# Patient Record
Sex: Female | Born: 1955 | Race: White | Hispanic: No | Marital: Married | Smoking: Never smoker
Health system: Southern US, Community
[De-identification: ages and names within clinical notes are randomized; demographics above are authoritative.]

## PROBLEM LIST (undated history)

## (undated) DIAGNOSIS — Z9889 Other specified postprocedural states: Secondary | ICD-10-CM

## (undated) DIAGNOSIS — M5126 Other intervertebral disc displacement, lumbar region: Secondary | ICD-10-CM

## (undated) DIAGNOSIS — G709 Myoneural disorder, unspecified: Secondary | ICD-10-CM

## (undated) DIAGNOSIS — M542 Cervicalgia: Secondary | ICD-10-CM

## (undated) DIAGNOSIS — R011 Cardiac murmur, unspecified: Secondary | ICD-10-CM

## (undated) DIAGNOSIS — C4491 Basal cell carcinoma of skin, unspecified: Secondary | ICD-10-CM

## (undated) DIAGNOSIS — G500 Trigeminal neuralgia: Secondary | ICD-10-CM

## (undated) DIAGNOSIS — N939 Abnormal uterine and vaginal bleeding, unspecified: Secondary | ICD-10-CM

## (undated) DIAGNOSIS — IMO0002 Reserved for concepts with insufficient information to code with codable children: Secondary | ICD-10-CM

## (undated) DIAGNOSIS — Z803 Family history of malignant neoplasm of breast: Secondary | ICD-10-CM

## (undated) DIAGNOSIS — S0300XA Dislocation of jaw, unspecified side, initial encounter: Secondary | ICD-10-CM

## (undated) DIAGNOSIS — M51369 Other intervertebral disc degeneration, lumbar region without mention of lumbar back pain or lower extremity pain: Secondary | ICD-10-CM

## (undated) DIAGNOSIS — D649 Anemia, unspecified: Secondary | ICD-10-CM

## (undated) DIAGNOSIS — D219 Benign neoplasm of connective and other soft tissue, unspecified: Secondary | ICD-10-CM

## (undated) DIAGNOSIS — Z1379 Encounter for other screening for genetic and chromosomal anomalies: Secondary | ICD-10-CM

## (undated) DIAGNOSIS — Z8742 Personal history of other diseases of the female genital tract: Secondary | ICD-10-CM

## (undated) DIAGNOSIS — B330 Epidemic myalgia: Secondary | ICD-10-CM

## (undated) DIAGNOSIS — F329 Major depressive disorder, single episode, unspecified: Secondary | ICD-10-CM

## (undated) DIAGNOSIS — M1990 Unspecified osteoarthritis, unspecified site: Secondary | ICD-10-CM

## (undated) DIAGNOSIS — T7840XA Allergy, unspecified, initial encounter: Secondary | ICD-10-CM

## (undated) DIAGNOSIS — F419 Anxiety disorder, unspecified: Secondary | ICD-10-CM

## (undated) DIAGNOSIS — M5136 Other intervertebral disc degeneration, lumbar region: Secondary | ICD-10-CM

## (undated) DIAGNOSIS — M5481 Occipital neuralgia: Secondary | ICD-10-CM

## (undated) DIAGNOSIS — Z8669 Personal history of other diseases of the nervous system and sense organs: Secondary | ICD-10-CM

## (undated) DIAGNOSIS — N8110 Cystocele, unspecified: Secondary | ICD-10-CM

## (undated) DIAGNOSIS — F32A Depression, unspecified: Secondary | ICD-10-CM

## (undated) DIAGNOSIS — R32 Unspecified urinary incontinence: Secondary | ICD-10-CM

## (undated) HISTORY — DX: Anemia, unspecified: D64.9

## (undated) HISTORY — DX: Benign neoplasm of connective and other soft tissue, unspecified: D21.9

## (undated) HISTORY — DX: Anxiety disorder, unspecified: F41.9

## (undated) HISTORY — DX: Depression, unspecified: F32.A

## (undated) HISTORY — DX: Major depressive disorder, single episode, unspecified: F32.9

## (undated) HISTORY — DX: Dislocation of jaw, unspecified side, initial encounter: S03.00XA

## (undated) HISTORY — DX: Other specified postprocedural states: Z98.890

## (undated) HISTORY — DX: Unspecified osteoarthritis, unspecified site: M19.90

## (undated) HISTORY — DX: Occipital neuralgia: M54.81

## (undated) HISTORY — DX: Other intervertebral disc displacement, lumbar region: M51.26

## (undated) HISTORY — DX: Other intervertebral disc degeneration, lumbar region without mention of lumbar back pain or lower extremity pain: M51.369

## (undated) HISTORY — DX: Reserved for concepts with insufficient information to code with codable children: IMO0002

## (undated) HISTORY — DX: Cystocele, unspecified: N81.10

## (undated) HISTORY — DX: Cardiac murmur, unspecified: R01.1

## (undated) HISTORY — DX: Other intervertebral disc degeneration, lumbar region: M51.36

## (undated) HISTORY — DX: Encounter for other screening for genetic and chromosomal anomalies: Z13.79

## (undated) HISTORY — DX: Epidemic myalgia: B33.0

## (undated) HISTORY — DX: Unspecified urinary incontinence: R32

## (undated) HISTORY — DX: Personal history of other diseases of the nervous system and sense organs: Z86.69

## (undated) HISTORY — DX: Basal cell carcinoma of skin, unspecified: C44.91

## (undated) HISTORY — DX: Personal history of other diseases of the female genital tract: Z87.42

## (undated) HISTORY — DX: Abnormal uterine and vaginal bleeding, unspecified: N93.9

## (undated) HISTORY — DX: Myoneural disorder, unspecified: G70.9

## (undated) HISTORY — DX: Trigeminal neuralgia: G50.0

## (undated) HISTORY — DX: Cervicalgia: M54.2

## (undated) HISTORY — DX: Family history of malignant neoplasm of breast: Z80.3

## (undated) HISTORY — DX: Allergy, unspecified, initial encounter: T78.40XA

---
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
## 1987-03-25 DIAGNOSIS — Z8742 Personal history of other diseases of the female genital tract: Secondary | ICD-10-CM

## 1987-03-25 HISTORY — DX: Personal history of other diseases of the female genital tract: Z87.42

## 1990-03-24 HISTORY — PX: URETHRAL DIVERTICULUM REPAIR: SHX5148

## 1993-03-24 HISTORY — PX: CHOLECYSTECTOMY: SHX55

## 1995-03-25 HISTORY — PX: VAGINAL HYSTERECTOMY: SUR661

## 1999-02-26 ENCOUNTER — Other Ambulatory Visit: Admission: RE | Admit: 1999-02-26 | Discharge: 1999-02-26 | Payer: Self-pay | Admitting: General Practice

## 1999-03-12 ENCOUNTER — Emergency Department (HOSPITAL_COMMUNITY): Admission: EM | Admit: 1999-03-12 | Discharge: 1999-03-13 | Payer: Self-pay | Admitting: Emergency Medicine

## 1999-03-13 ENCOUNTER — Encounter: Payer: Self-pay | Admitting: Emergency Medicine

## 1999-10-17 ENCOUNTER — Encounter: Admission: RE | Admit: 1999-10-17 | Discharge: 1999-10-17 | Payer: Self-pay | Admitting: Internal Medicine

## 1999-10-17 ENCOUNTER — Encounter: Payer: Self-pay | Admitting: Internal Medicine

## 2000-02-26 ENCOUNTER — Other Ambulatory Visit: Admission: RE | Admit: 2000-02-26 | Discharge: 2000-02-26 | Payer: Self-pay | Admitting: Gynecology

## 2000-10-20 ENCOUNTER — Encounter: Payer: Self-pay | Admitting: Rheumatology

## 2000-10-20 ENCOUNTER — Encounter: Admission: RE | Admit: 2000-10-20 | Discharge: 2000-10-20 | Payer: Self-pay | Admitting: Rheumatology

## 2001-01-01 ENCOUNTER — Encounter: Payer: Self-pay | Admitting: Rheumatology

## 2001-01-01 ENCOUNTER — Encounter: Admission: RE | Admit: 2001-01-01 | Discharge: 2001-01-01 | Payer: Self-pay | Admitting: Rheumatology

## 2001-01-21 ENCOUNTER — Encounter: Admission: RE | Admit: 2001-01-21 | Discharge: 2001-01-21 | Payer: Self-pay | Admitting: Rheumatology

## 2001-01-21 ENCOUNTER — Encounter: Payer: Self-pay | Admitting: Rheumatology

## 2001-03-01 ENCOUNTER — Other Ambulatory Visit: Admission: RE | Admit: 2001-03-01 | Discharge: 2001-03-01 | Payer: Self-pay | Admitting: Gynecology

## 2002-02-07 ENCOUNTER — Other Ambulatory Visit: Admission: RE | Admit: 2002-02-07 | Discharge: 2002-02-07 | Payer: Self-pay | Admitting: Gynecology

## 2003-03-15 ENCOUNTER — Other Ambulatory Visit: Admission: RE | Admit: 2003-03-15 | Discharge: 2003-03-15 | Payer: Self-pay | Admitting: Gynecology

## 2003-05-20 ENCOUNTER — Ambulatory Visit (HOSPITAL_COMMUNITY): Admission: RE | Admit: 2003-05-20 | Discharge: 2003-05-20 | Payer: Self-pay | Admitting: Internal Medicine

## 2004-03-07 ENCOUNTER — Other Ambulatory Visit: Admission: RE | Admit: 2004-03-07 | Discharge: 2004-03-07 | Payer: Self-pay | Admitting: Gynecology

## 2005-04-07 ENCOUNTER — Other Ambulatory Visit: Admission: RE | Admit: 2005-04-07 | Discharge: 2005-04-07 | Payer: Self-pay | Admitting: Gynecology

## 2008-03-24 HISTORY — PX: CRANIECTOMY: SHX331

## 2013-03-24 DIAGNOSIS — C4491 Basal cell carcinoma of skin, unspecified: Secondary | ICD-10-CM

## 2013-03-24 HISTORY — DX: Basal cell carcinoma of skin, unspecified: C44.91

## 2014-03-24 DIAGNOSIS — N8110 Cystocele, unspecified: Secondary | ICD-10-CM

## 2014-03-24 HISTORY — DX: Cystocele, unspecified: N81.10

## 2015-05-01 ENCOUNTER — Ambulatory Visit (INDEPENDENT_AMBULATORY_CARE_PROVIDER_SITE_OTHER): Payer: Medicare Other | Admitting: Family Medicine

## 2015-05-01 ENCOUNTER — Encounter: Payer: Self-pay | Admitting: Family Medicine

## 2015-05-01 VITALS — BP 134/82 | HR 77 | Temp 98.5°F | Resp 20 | Ht 68.5 in | Wt 210.0 lb

## 2015-05-01 DIAGNOSIS — M5481 Occipital neuralgia: Secondary | ICD-10-CM

## 2015-05-01 DIAGNOSIS — G500 Trigeminal neuralgia: Secondary | ICD-10-CM | POA: Insufficient documentation

## 2015-05-01 DIAGNOSIS — N8110 Cystocele, unspecified: Secondary | ICD-10-CM | POA: Insufficient documentation

## 2015-05-01 DIAGNOSIS — G894 Chronic pain syndrome: Secondary | ICD-10-CM

## 2015-05-01 DIAGNOSIS — Z79890 Hormone replacement therapy (postmenopausal): Secondary | ICD-10-CM

## 2015-05-01 DIAGNOSIS — M797 Fibromyalgia: Secondary | ICD-10-CM

## 2015-05-01 DIAGNOSIS — Z7189 Other specified counseling: Secondary | ICD-10-CM

## 2015-05-01 DIAGNOSIS — F1190 Opioid use, unspecified, uncomplicated: Secondary | ICD-10-CM

## 2015-05-01 DIAGNOSIS — M9909 Segmental and somatic dysfunction of abdomen and other regions: Secondary | ICD-10-CM

## 2015-05-01 DIAGNOSIS — Z7689 Persons encountering health services in other specified circumstances: Secondary | ICD-10-CM | POA: Insufficient documentation

## 2015-05-01 MED ORDER — ESTRADIOL 0.05 MG/24HR TD PTWK: 0.0500 mg | MEDICATED_PATCH | TRANSDERMAL | Status: DC
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
## 2015-05-01 NOTE — Patient Instructions (Signed)
Health Maintenance, Female Adopting a healthy lifestyle and getting preventive care can go a long way to promote health and wellness. Talk with your health care provider about what schedule of regular examinations is right for you. This is a good chance for you to check in with your provider about disease prevention and staying healthy. In between checkups, there are plenty of things you can do on your own. Experts have done a lot of research about which lifestyle changes and preventive measures are most likely to keep you healthy. Ask your health care provider for more information. WEIGHT AND DIET  Eat a healthy diet  Be sure to include plenty of vegetables, fruits, low-fat dairy products, and lean protein.  Do not eat a lot of foods high in solid fats, added sugars, or salt.  Get regular exercise. This is one of the most important things you can do for your health.  Most adults should exercise for at least 150 minutes each week. The exercise should increase your heart rate and make you sweat (moderate-intensity exercise).  Most adults should also do strengthening exercises at least twice a week. This is in addition to the moderate-intensity exercise.  Maintain a healthy weight  Body mass index (BMI) is a measurement that can be used to identify possible weight problems. It estimates body fat based on height and weight. Your health care provider can help determine your BMI and help you achieve or maintain a healthy weight.  For females 20 years of age and older:   A BMI below 18.5 is considered underweight.  A BMI of 18.5 to 24.9 is normal.  A BMI of 25 to 29.9 is considered overweight.  A BMI of 30 and above is considered obese.  Watch levels of cholesterol and blood lipids  You should start having your blood tested for lipids and cholesterol at 61 years of age, then have this test every 5 years.  You may need to have your cholesterol levels checked more often if:  Your lipid  or cholesterol levels are high.  You are older than 61 years of age.  You are at high risk for heart disease.  CANCER SCREENING   Lung Cancer  Lung cancer screening is recommended for adults 55-80 years old who are at high risk for lung cancer because of a history of smoking.  A yearly low-dose CT scan of the lungs is recommended for people who:  Currently smoke.  Have quit within the past 15 years.  Have at least a 30-pack-year history of smoking. A pack year is smoking an average of one pack of cigarettes a day for 1 year.  Yearly screening should continue until it has been 15 years since you quit.  Yearly screening should stop if you develop a health problem that would prevent you from having lung cancer treatment.  Breast Cancer  Practice breast self-awareness. This means understanding how your breasts normally appear and feel.  It also means doing regular breast self-exams. Let your health care provider know about any changes, no matter how small.  If you are in your 20s or 30s, you should have a clinical breast exam (CBE) by a health care provider every 1-3 years as part of a regular health exam.  If you are 40 or older, have a CBE every year. Also consider having a breast X-ray (mammogram) every year.  If you have a family history of breast cancer, talk to your health care provider about genetic screening.  If you   are at high risk for breast cancer, talk to your health care provider about having an MRI and a mammogram every year.  Breast cancer gene (BRCA) assessment is recommended for women who have family members with BRCA-related cancers. BRCA-related cancers include:  Breast.  Ovarian.  Tubal.  Peritoneal cancers.  Results of the assessment will determine the need for genetic counseling and BRCA1 and BRCA2 testing. Cervical Cancer Your health care provider may recommend that you be screened regularly for cancer of the pelvic organs (ovaries, uterus, and  vagina). This screening involves a pelvic examination, including checking for microscopic changes to the surface of your cervix (Pap test). You may be encouraged to have this screening done every 3 years, beginning at age 21.  For women ages 30-65, health care providers may recommend pelvic exams and Pap testing every 3 years, or they may recommend the Pap and pelvic exam, combined with testing for human papilloma virus (HPV), every 5 years. Some types of HPV increase your risk of cervical cancer. Testing for HPV may also be done on women of any age with unclear Pap test results.  Other health care providers may not recommend any screening for nonpregnant women who are considered low risk for pelvic cancer and who do not have symptoms. Ask your health care provider if a screening pelvic exam is right for you.  If you have had past treatment for cervical cancer or a condition that could lead to cancer, you need Pap tests and screening for cancer for at least 20 years after your treatment. If Pap tests have been discontinued, your risk factors (such as having a new sexual partner) need to be reassessed to determine if screening should resume. Some women have medical problems that increase the chance of getting cervical cancer. In these cases, your health care provider may recommend more frequent screening and Pap tests. Colorectal Cancer  This type of cancer can be detected and often prevented.  Routine colorectal cancer screening usually begins at 61 years of age and continues through 61 years of age.  Your health care provider may recommend screening at an earlier age if you have risk factors for colon cancer.  Your health care provider may also recommend using home test kits to check for hidden blood in the stool.  A small camera at the end of a tube can be used to examine your colon directly (sigmoidoscopy or colonoscopy). This is done to check for the earliest forms of colorectal  cancer.  Routine screening usually begins at age 50.  Direct examination of the colon should be repeated every 5-10 years through 61 years of age. However, you may need to be screened more often if early forms of precancerous polyps or small growths are found. Skin Cancer  Check your skin from head to toe regularly.  Tell your health care provider about any new moles or changes in moles, especially if there is a change in a mole's shape or color.  Also tell your health care provider if you have a mole that is larger than the size of a pencil eraser.  Always use sunscreen. Apply sunscreen liberally and repeatedly throughout the day.  Protect yourself by wearing long sleeves, pants, a wide-brimmed hat, and sunglasses whenever you are outside. HEART DISEASE, DIABETES, AND HIGH BLOOD PRESSURE   High blood pressure causes heart disease and increases the risk of stroke. High blood pressure is more likely to develop in:  People who have blood pressure in the high end   of the normal range (130-139/85-89 mm Hg).  People who are overweight or obese.  People who are African American.  If you are 38-23 years of age, have your blood pressure checked every 3-5 years. If you are 61 years of age or older, have your blood pressure checked every year. You should have your blood pressure measured twice--once when you are at a hospital or clinic, and once when you are not at a hospital or clinic. Record the average of the two measurements. To check your blood pressure when you are not at a hospital or clinic, you can use:  An automated blood pressure machine at a pharmacy.  A home blood pressure monitor.  If you are between 45 years and 39 years old, ask your health care provider if you should take aspirin to prevent strokes.  Have regular diabetes screenings. This involves taking a blood sample to check your fasting blood sugar level.  If you are at a normal weight and have a low risk for diabetes,  have this test once every three years after 61 years of age.  If you are overweight and have a high risk for diabetes, consider being tested at a younger age or more often. PREVENTING INFECTION  Hepatitis B  If you have a higher risk for hepatitis B, you should be screened for this virus. You are considered at high risk for hepatitis B if:  You were born in a country where hepatitis B is common. Ask your health care provider which countries are considered high risk.  Your parents were born in a high-risk country, and you have not been immunized against hepatitis B (hepatitis B vaccine).  You have HIV or AIDS.  You use needles to inject street drugs.  You live with someone who has hepatitis B.  You have had sex with someone who has hepatitis B.  You get hemodialysis treatment.  You take certain medicines for conditions, including cancer, organ transplantation, and autoimmune conditions. Hepatitis C  Blood testing is recommended for:  Everyone born from [birth year] through 1966.  Anyone with known risk factors for hepatitis C. Sexually transmitted infections (STIs)  You should be screened for sexually transmitted infections (STIs) including gonorrhea and chlamydia if:  You are sexually active and are younger than 61 years of age.  You are older than 61 years of age and your health care provider tells you that you are at risk for this type of infection.  Your sexual activity has changed since you were last screened and you are at an increased risk for chlamydia or gonorrhea. Ask your health care provider if you are at risk.  If you do not have HIV, but are at risk, it may be recommended that you take a prescription medicine daily to prevent HIV infection. This is called pre-exposure prophylaxis (PrEP). You are considered at risk if:  You are sexually active and do not regularly use condoms or know the HIV status of your partner(s).  You take drugs by injection.  You are sexually  active with a partner who has HIV. Talk with your health care provider about whether you are at high risk of being infected with HIV. If you choose to begin PrEP, you should first be tested for HIV. You should then be tested every 3 months for as long as you are taking PrEP.  PREGNANCY   If you are premenopausal and you may become pregnant, ask your health care provider about preconception counseling.  If you may  become pregnant, take 400 to 800 micrograms (mcg) of folic acid every day.  If you want to prevent pregnancy, talk to your health care provider about birth control (contraception). OSTEOPOROSIS AND MENOPAUSE   Osteoporosis is a disease in which the bones lose minerals and strength with aging. This can result in serious bone fractures. Your risk for osteoporosis can be identified using a bone density scan.  If you are 106 years of age or older, or if you are at risk for osteoporosis and fractures, ask your health care provider if you should be screened.  Ask your health care provider whether you should take a calcium or vitamin D supplement to lower your risk for osteoporosis.  Menopause may have certain physical symptoms and risks.  Hormone replacement therapy may reduce some of these symptoms and risks. Talk to your health care provider about whether hormone replacement therapy is right for you.  HOME CARE INSTRUCTIONS   Schedule regular health, dental, and eye exams.  Stay current with your immunizations.   Do not use any tobacco products including cigarettes, chewing tobacco, or electronic cigarettes.  If you are pregnant, do not drink alcohol.  If you are breastfeeding, limit how much and how often you drink alcohol.  Limit alcohol intake to no more than 1 drink per day for nonpregnant women. One drink equals 12 ounces of beer, 5 ounces of wine, or 1 ounces of hard liquor.  Do not use street drugs.  Do not share needles.  Ask your health care provider for help if  you need support or information about quitting drugs.  Tell your health care provider if you often feel depressed.  Tell your health care provider if you have ever been abused or do not feel safe at home.   This information is not intended to replace advice given to you by your health care provider. Make sure you discuss any questions you have with your health care provider.   Document Released: 09/23/2010 Document Revised: 03/31/2014 Document Reviewed: 02/09/2013 Elsevier Interactive Patient Education 2016 [place] wellness visit in the beginning of May with labs at that time.  I will make referral we discussed today

## 2015-05-14 ENCOUNTER — Ambulatory Visit: Payer: Self-pay | Admitting: Obstetrics and Gynecology

## 2015-05-16 DIAGNOSIS — M5412 Radiculopathy, cervical region: Secondary | ICD-10-CM | POA: Diagnosis not present

## 2015-05-16 DIAGNOSIS — G500 Trigeminal neuralgia: Secondary | ICD-10-CM | POA: Diagnosis not present

## 2015-05-16 DIAGNOSIS — G894 Chronic pain syndrome: Secondary | ICD-10-CM | POA: Diagnosis not present

## 2015-05-17 ENCOUNTER — Other Ambulatory Visit: Payer: Self-pay | Admitting: Obstetrics and Gynecology

## 2015-05-17 ENCOUNTER — Encounter: Payer: Self-pay | Admitting: Family Medicine

## 2015-05-17 ENCOUNTER — Other Ambulatory Visit: Payer: Self-pay | Admitting: Family Medicine

## 2015-05-17 ENCOUNTER — Ambulatory Visit (INDEPENDENT_AMBULATORY_CARE_PROVIDER_SITE_OTHER): Payer: Medicare Other | Admitting: Obstetrics and Gynecology

## 2015-05-17 ENCOUNTER — Other Ambulatory Visit: Payer: Self-pay | Admitting: *Deleted

## 2015-05-17 VITALS — BP 118/76 | HR 70 | Resp 14 | Ht 68.5 in | Wt 211.4 lb

## 2015-05-17 DIAGNOSIS — M9909 Segmental and somatic dysfunction of abdomen and other regions: Secondary | ICD-10-CM

## 2015-05-17 DIAGNOSIS — N816 Rectocele: Secondary | ICD-10-CM

## 2015-05-17 DIAGNOSIS — N398 Other specified disorders of urinary system: Secondary | ICD-10-CM | POA: Diagnosis not present

## 2015-05-17 MED ORDER — ESTRADIOL 0.05 MG/24HR TD PTWK: 0.0500 mg | MEDICATED_PATCH | TRANSDERMAL | Status: DC

## 2015-05-17 NOTE — Telephone Encounter (Signed)
Please get a copy of the patient's mammogram done in [place].

## 2015-05-17 NOTE — Addendum Note (Signed)
Addended by: Leota Jacobsen on: 05/17/2015 11:47 AM   Modules accepted: Medications
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
## 2015-05-17 NOTE — Telephone Encounter (Signed)
Medication refill request: [place] Patient Visit today 05/16/25 Next AEX: not scheduled  Last MMG (if hormonal medication request): 10/2014 dense breast/ normal Refill authorized: please advise  Patient saw Dr. Quincy Simmonds today as a new patient but forgot to request refills on Estradiol patches.  When asked about the dosage she said, "Dr. Quincy Simmonds or her assistant will know the dosage."

## 2015-05-17 NOTE — Telephone Encounter (Signed)
Patient saw Dr. Quincy Simmonds today as a new patient but forgot to request refills on Estradiol patches.  When asked about the dosage she said, "Dr. Quincy Simmonds or her assistant will know the dosage."  Diana Gross on [address] in [place]

## 2015-05-17 NOTE — Patient Instructions (Signed)
Polyethylene Glycol powder What is this medicine? POLYETHYLENE GLYCOL 3350 (pol ee ETH i leen; GLYE col) powder is a laxative used to treat constipation. It increases the amount of water in the stool. Bowel movements become easier and more frequent. This medicine may be used for other purposes; ask your health care provider or pharmacist if you have questions. What should I tell my health care provider before I take this medicine? They need to know if you have any of these conditions: -a history of blockage of the stomach or intestine -current abdomen distension or pain -difficulty swallowing -diverticulitis, ulcerative colitis, or other chronic bowel disease -phenylketonuria -an unusual or allergic reaction to polyethylene glycol, other medicines, dyes, or preservatives -pregnant or trying to get pregnant -breast-feeding How should I use this medicine? Take this medicine by mouth. The bottle has a measuring cap that is marked with a line. Pour the powder into the cap up to the marked line (the dose is about 1 heaping tablespoon). Add the powder in the cap to a full glass (4 to 8 ounces or 120 to 240 ml) of water, juice, soda, coffee or tea. Mix the powder well. Drink the solution. Take exactly as directed. Do not take your medicine more often than directed. Talk to your pediatrician regarding the use of this medicine in children. Special care may be needed. Overdosage: If you think you have taken too much of this medicine contact a poison control center or emergency room at once. NOTE: This medicine is only for you. Do not share this medicine with others. What if I miss a dose? If you miss a dose, take it as soon as you can. If it is almost time for your next dose, take only that dose. Do not take double or extra doses. What may interact with this medicine? Interactions are not expected. This list may not describe all possible interactions. Give your health care provider a list of all the  medicines, herbs, non-prescription drugs, or dietary supplements you use. Also tell them if you smoke, drink alcohol, or use illegal drugs. Some items may interact with your medicine. What should I watch for while using this medicine? Do not use for more than 2 weeks without advice from your doctor or health care professional. It can take 2 to 4 days to have a bowel movement and to experience improvement in constipation. See your health care professional for any changes in bowel habits, including constipation, that are severe or last longer than three weeks. Always take this medicine with plenty of water. What side effects may I notice from receiving this medicine? Side effects that you should report to your doctor or health care professional as soon as possible: -diarrhea -difficulty breathing -itching of the skin, hives, or skin rash -severe bloating, pain, or distension of the stomach -vomiting Side effects that usually do not require medical attention (report to your doctor or health care professional if they continue or are bothersome): -bloating or gas -lower abdominal discomfort or cramps -nausea This list may not describe all possible side effects. Call your doctor for medical advice about side effects. You may report side effects to FDA at 1-800-FDA-1088. Where should I keep my medicine? Keep out of the reach of children. Store between 15 and 30 degrees C (59 and 86 degrees F). Throw away any unused medicine after the expiration date. NOTE: This sheet is a summary. It may not cover all possible information. If you have questions about this medicine, talk to  your doctor, pharmacist, or health care provider.    2016, Elsevier/Gold Standard. (2007-10-11 16:50:45)  Docusate capsules What is this medicine? DOCUSATE (doc CUE sayt) is stool softener. It helps prevent constipation and straining or discomfort associated with hard or dry stools. This medicine may be used for other purposes;  ask your health care provider or pharmacist if you have questions. What should I tell my health care provider before I take this medicine? They need to know if you have any of these conditions: -nausea or vomiting -severe constipation -stomach pain -sudden change in bowel habit lasting more than 2 weeks -an unusual or allergic reaction to docusate, other medicines, foods, dyes, or preservatives -pregnant or trying to get pregnant -breast-feeding How should I use this medicine? Take this medicine by mouth with a glass of water. Follow the directions on the label. Take your doses at regular intervals. Do not take your medicine more often than directed. Talk to your pediatrician regarding the use of this medicine in children. While this medicine may be prescribed for children as young as 2 years for selected conditions, precautions do apply. Overdosage: If you think you have taken too much of this medicine contact a poison control center or emergency room at once. NOTE: This medicine is only for you. Do not share this medicine with others. What if I miss a dose? If you miss a dose, take it as soon as you can. If it is almost time for your next dose, take only that dose. Do not take double or extra doses. What may interact with this medicine? -mineral oil This list may not describe all possible interactions. Give your health care provider a list of all the medicines, herbs, non-prescription drugs, or dietary supplements you use. Also tell them if you smoke, drink alcohol, or use illegal drugs. Some items may interact with your medicine. What should I watch for while using this medicine? Do not use for more than one week without advice from your doctor or health care professional. If your constipation returns, check with your doctor or health care professional. Drink plenty of water while taking this medicine. Drinking water helps decrease constipation. Stop using this medicine and contact your  doctor or health care professional if you experience any rectal bleeding or do not have a bowel movement after use. These could be signs of a more serious condition. What side effects may I notice from receiving this medicine? Side effects that you should report to your doctor or health care professional as soon as possible: -allergic reactions like skin rash, itching or hives, swelling of the face, lips, or tongue Side effects that usually do not require medical attention (report to your doctor or health care professional if they continue or are bothersome): -diarrhea -stomach cramps -throat irritation This list may not describe all possible side effects. Call your doctor for medical advice about side effects. You may report side effects to FDA at 1-800-FDA-1088. Where should I keep my medicine? Keep out of the reach of children. Store at room temperature between 15 and 30 degrees C (59 and 86 degrees F). Throw away any unused medicine after the expiration date. NOTE: This sheet is a summary. It may not cover all possible information. If you have questions about this medicine, talk to your doctor, pharmacist, or health care provider.    2016, Elsevier/Gold Standard. (2007-07-01 15:56:49)

## 2015-05-21 NOTE — Telephone Encounter (Signed)
I called patient and requested that she get [place] a copy. She said she will call and request it from [place]

## 2015-05-29 ENCOUNTER — Encounter: Payer: Self-pay | Admitting: Obstetrics and Gynecology

## 2015-05-30 ENCOUNTER — Telehealth: Payer: Self-pay | Admitting: Emergency Medicine

## 2015-05-30 NOTE — Telephone Encounter (Signed)
Responded to patient via mychart:    -------------------------------------------------------------------------------- Mrs. Uddin,   The lab results that are available in your medical record are the lab results that were completed for you on 10/13/2014 and ordered by Dr. Zane Herald.  Dr. Quincy Simmonds reviewed them and they have been scanned in to your medical records. Dr. Quincy Simmonds did not provide any notes or mention of concern on the lab results.   When your mammogram arrives it will be scanned into your record as well after Dr. Quincy Simmonds reviews the results.   Sincerely,   Karen Chafe, BSN RN-BC Triage Nurse [place] [address] [place], [place] [address] [facility]:  [phone]; Fax:  [phone]

## 2015-05-30 NOTE — Telephone Encounter (Signed)
Responded to patient via mychart.  Telephone encounter created and sent to Dr. Quincy Simmonds.

## 2015-05-30 NOTE — Telephone Encounter (Signed)
Chief Complaint  Patient presents with  . Advice Only    Patient sent mychart message    ----- Message ----- From: Ulla Gallo Sent: 05/29/2015 1:16 PM EST To: Arloa Koh, MD Subject: Non-Urgent Medical Question  I do not understand my test results, please let me know if there is anything I need to know. I am going to come by today to sign a form so the doctor can get my last mammogram results, since I was told she cannot give me further HRT without seeing this. Thank you, Diana Gross

## 2015-05-30 NOTE — Telephone Encounter (Signed)
Routing to Dr. Silva to review.  

## 2015-05-30 NOTE — Telephone Encounter (Signed)
I made a note to refill her estrogen prescriptions when she returns for a pessary fitting appointment provided I received her mammogram results.  She may have refills until her next mammogram and full annual exam is due.

## 2015-05-31 ENCOUNTER — Telehealth: Payer: Self-pay | Admitting: Obstetrics and Gynecology

## 2015-05-31 NOTE — Telephone Encounter (Signed)
Please contact patient regarding her normal mammogram report which I received.  She may have refills of her hormone replacement until her next annual exam is due, which I believe is August 2017.  She is using Climara 0.05 mg weekly.

## 2015-06-01 MED ORDER — ESTRADIOL 0.05 MG/24HR TD PTWK: 0.0500 mg | MEDICATED_PATCH | TRANSDERMAL | Status: DC

## 2015-06-01 NOTE — Telephone Encounter (Signed)
Spoke with patient. Advised of message as seen below from [place]. She is agreeable. Aware rx for Climara .0.05 mg weekly patches have been sent to her pharmacy on file. Patient is asking about precertification for urodynamics testing. Advised I will send a message to the insurance department in the office to check on the status of this precertification. Advised she will receive a phone call from the insurance department in the office to discuss benefits and then we will move forward with scheduling. She is agreeable.  Cc: Lerry Liner  Routing to provider for final review. Patient agreeable to disposition. Will close encounter.

## 2015-06-01 NOTE — Telephone Encounter (Signed)
Patient's last annual exam was on 05/17/2015. Rx for Climara 0.05 mg weekly patches #[id number] until her next aex is due in 04/2016 sent to pharmacy on file.   Left message to call [place] at [phone].

## 2015-06-01 NOTE — Telephone Encounter (Signed)
Patient returning your call. Best # to reach: [phone]

## 2015-06-07 ENCOUNTER — Encounter: Payer: Self-pay | Admitting: *Deleted

## 2015-06-07 ENCOUNTER — Encounter: Payer: Self-pay | Admitting: Family Medicine

## 2015-06-12 ENCOUNTER — Ambulatory Visit (INDEPENDENT_AMBULATORY_CARE_PROVIDER_SITE_OTHER): Payer: Medicare Other | Admitting: Family Medicine

## 2015-06-12 ENCOUNTER — Encounter: Payer: Self-pay | Admitting: Family Medicine

## 2015-06-12 VITALS — BP 116/80 | HR 67 | Ht 68.0 in | Wt 211.0 lb

## 2015-06-12 DIAGNOSIS — M9903 Segmental and somatic dysfunction of lumbar region: Secondary | ICD-10-CM

## 2015-06-12 DIAGNOSIS — M9902 Segmental and somatic dysfunction of thoracic region: Secondary | ICD-10-CM

## 2015-06-12 DIAGNOSIS — M9901 Segmental and somatic dysfunction of cervical region: Secondary | ICD-10-CM | POA: Diagnosis not present

## 2015-06-12 DIAGNOSIS — M9909 Segmental and somatic dysfunction of abdomen and other regions: Secondary | ICD-10-CM

## 2015-06-12 DIAGNOSIS — M999 Biomechanical lesion, unspecified: Secondary | ICD-10-CM | POA: Insufficient documentation

## 2015-06-12 NOTE — Patient Instructions (Addendum)
Good to meet you  I do things a little different We will try manipulation but is should you have more myofacial release Look up nancy Yao and see if she would be helpful. Also look up Vivi Ferns or even [employer].  On wall with heels, butt shoulder and head touching for a goal of 5 minutes daily  Try to keep monitor at eye level when using a comput and keeping shoulders back in chair and think of a music stand if reading.  Exercises 3 times a week.  Vitamin D 2000 iU daily  Turmeric 500mg  twice daily  Fish oil 2 grams daily  Tart cherry extract any dose.  See me again in 3-4 weeks.

## 2015-06-12 NOTE — Progress Notes (Signed)
Pre visit review using our clinic review tool, if applicable. No additional management support is needed unless otherwise documented below in the visit note. 

## 2015-06-12 NOTE — Assessment & Plan Note (Signed)
Significant somatic dysfunction. Has been seen for manipulation by another provider. Attempted today with good resolution of pain. Patient sounds like she has had more myofascial release and was given names of other individuals who did this type of technique. We discussed over-the-counter medications a could be beneficial. Patient even some home exercises. Tolerated them very well when we were showing her in great detail. We discussed icing better than he. Patient will come back and see me again in 3-4 weeks.

## 2015-06-12 NOTE — Assessment & Plan Note (Signed)
Decision today to treat with OMT was based on Physical Exam  After verbal consent patient was treated with HVLA, ME, FPR techniques in cervical, thoracic and lumbar areas  Patient tolerated the procedure well with improvement in symptoms  Patient given exercises, stretches and lifestyle modifications  See medications in patient instructions if given  Patient will follow up in 3-4 weeks                     
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
## 2015-06-18 ENCOUNTER — Telehealth: Payer: Self-pay | Admitting: Emergency Medicine

## 2015-06-18 NOTE — Telephone Encounter (Signed)
-----   Message from Laurence Slate sent at 06/04/2015  9:39 AM EDT ----- Patient scheduled for 07/11/15 at 10:am for Urodynamics. Please call patient with instructions   Thanks, Seth Bake

## 2015-06-18 NOTE — Telephone Encounter (Signed)
Call to patient to discuss pre-procedure and urodynamics planning.  Unable to leave voice mail on home phone.  Will send mychart message at this time to request patient return call to office.

## 2015-06-20 DIAGNOSIS — M5412 Radiculopathy, cervical region: Secondary | ICD-10-CM | POA: Diagnosis not present

## 2015-06-20 DIAGNOSIS — G894 Chronic pain syndrome: Secondary | ICD-10-CM | POA: Diagnosis not present

## 2015-06-20 DIAGNOSIS — G500 Trigeminal neuralgia: Secondary | ICD-10-CM | POA: Diagnosis not present

## 2015-06-26 ENCOUNTER — Encounter: Payer: Self-pay | Admitting: Emergency Medicine

## 2015-06-26 NOTE — Telephone Encounter (Signed)
Late entry 10:52 Incoming call from patient:   Spoke with patient and urodynamics instructions given. Scheduled urodynamics procedure for 07/11/15 at 10:00 Advised to stop all bladder medications one week prior to procedure, patient states she is not on any medications. Arrive with comfortably full bladder.  Advised will need pre procedure UA to check for any infection prior. Scheduled for 07/05/15.  Scheduled follow up with Dr. Quincy Simmonds for  07/16/15 at 1530 Brief description of procedure given and patient agreeable.  Letter with appointment information and written instructions with post procedure instructions mailed to home address.  Patient verbalizes understanding of instructions and agreeable to appointments as scheduled for testing and follow up.   Routing message for Dr. Quincy Simmonds to review upon return to office.  Patient notes reviewed and fears r/t catheters reviewed with patient. Patient has Xanax that she takes as needed and wants to know if she can take Xanax for procedure. Her husband will be driving her to appointment. Advised will need to obtain verbal consent prior to procedure and will send message to Dr. Quincy Simmonds to ensure okay for Xanax at time of procedure.

## 2015-06-26 NOTE — Telephone Encounter (Signed)
Call to patient, husband Liliane Channel picked up the phone. Patient is not home. Message left to return my call with phone number.

## 2015-06-26 NOTE — Telephone Encounter (Signed)
As long as she comes in and signs her consent prior to the procedure and has a driver, she can take her Xanax. Her appointment with Dr Quincy Simmonds is on another day, so it shouldn't inhibit her understanding of the recommendations.

## 2015-07-02 ENCOUNTER — Ambulatory Visit (INDEPENDENT_AMBULATORY_CARE_PROVIDER_SITE_OTHER): Payer: Medicare Other | Admitting: Family Medicine

## 2015-07-02 ENCOUNTER — Encounter: Payer: Self-pay | Admitting: Family Medicine

## 2015-07-02 VITALS — BP 132/83 | HR 72 | Temp 98.6°F | Resp 20 | Ht 68.0 in | Wt 205.5 lb

## 2015-07-02 DIAGNOSIS — Z83511 Family history of glaucoma: Secondary | ICD-10-CM | POA: Insufficient documentation

## 2015-07-02 DIAGNOSIS — Z131 Encounter for screening for diabetes mellitus: Secondary | ICD-10-CM

## 2015-07-02 DIAGNOSIS — Z0000 Encounter for general adult medical examination without abnormal findings: Secondary | ICD-10-CM | POA: Insufficient documentation

## 2015-07-02 DIAGNOSIS — Z1329 Encounter for screening for other suspected endocrine disorder: Secondary | ICD-10-CM

## 2015-07-02 DIAGNOSIS — L989 Disorder of the skin and subcutaneous tissue, unspecified: Secondary | ICD-10-CM | POA: Diagnosis not present

## 2015-07-02 DIAGNOSIS — Z13220 Encounter for screening for lipoid disorders: Secondary | ICD-10-CM

## 2015-07-02 DIAGNOSIS — Z130 Encounter for screening for diseases of the blood and blood-forming organs and certain disorders involving the immune mechanism: Secondary | ICD-10-CM | POA: Diagnosis not present

## 2015-07-02 DIAGNOSIS — Z23 Encounter for immunization: Secondary | ICD-10-CM | POA: Insufficient documentation

## 2015-07-02 DIAGNOSIS — H579 Unspecified disorder of eye and adnexa: Secondary | ICD-10-CM | POA: Insufficient documentation

## 2015-07-02 DIAGNOSIS — Z6829 Body mass index (BMI) 29.0-29.9, adult: Secondary | ICD-10-CM | POA: Insufficient documentation

## 2015-07-02 HISTORY — DX: Family history of glaucoma: Z83.511

## 2015-07-02 MED ORDER — ZOSTER VACCINE LIVE 19400 UNT/0.65ML ~~LOC~~ SOLR: 0.6500 mL | Freq: Once | SUBCUTANEOUS | Status: DC
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
## 2015-07-02 NOTE — Telephone Encounter (Signed)
I have reviewed the phone notes.  [place] for Xanax if needed as she will have a driver.  You may close the encounter.

## 2015-07-02 NOTE — Telephone Encounter (Signed)
Call to patient. Notified of response from Dr Quincy Simmonds. May take Xanax if needed as long as she has a driver.  Encounter closed.

## 2015-07-02 NOTE — Patient Instructions (Signed)
Health Maintenance, Female Adopting a healthy lifestyle and getting preventive care can go a long way to promote health and wellness. Talk with your health care provider about what schedule of regular examinations is right for you. This is a good chance for you to check in with your provider about disease prevention and staying healthy. In between checkups, there are plenty of things you can do on your own. Experts have done a lot of research about which lifestyle changes and preventive measures are most likely to keep you healthy. Ask your health care provider for more information. WEIGHT AND DIET  Eat a healthy diet  Be sure to include plenty of vegetables, fruits, low-fat dairy products, and lean protein.  Do not eat a lot of foods high in solid fats, added sugars, or salt.  Get regular exercise. This is one of the most important things you can do for your health.  Most adults should exercise for at least 150 minutes each week. The exercise should increase your heart rate and make you sweat (moderate-intensity exercise).  Most adults should also do strengthening exercises at least twice a week. This is in addition to the moderate-intensity exercise.  Maintain a healthy weight  Body mass index (BMI) is a measurement that can be used to identify possible weight problems. It estimates body fat based on height and weight. Your health care provider can help determine your BMI and help you achieve or maintain a healthy weight.  For females 20 years of age and older:   A BMI below 18.5 is considered underweight.  A BMI of 18.5 to 24.9 is normal.  A BMI of 25 to 29.9 is considered overweight.  A BMI of 30 and above is considered obese.  Watch levels of cholesterol and blood lipids  You should start having your blood tested for lipids and cholesterol at 61 years of age, then have this test every 5 years.  You may need to have your cholesterol levels checked more often if:  Your lipid  or cholesterol levels are high.  You are older than 61 years of age.  You are at high risk for heart disease.  CANCER SCREENING   Lung Cancer  Lung cancer screening is recommended for adults 55-80 years old who are at high risk for lung cancer because of a history of smoking.  A yearly low-dose CT scan of the lungs is recommended for people who:  Currently smoke.  Have quit within the past 15 years.  Have at least a 30-pack-year history of smoking. A pack year is smoking an average of one pack of cigarettes a day for 1 year.  Yearly screening should continue until it has been 15 years since you quit.  Yearly screening should stop if you develop a health problem that would prevent you from having lung cancer treatment.  Breast Cancer  Practice breast self-awareness. This means understanding how your breasts normally appear and feel.  It also means doing regular breast self-exams. Let your health care provider know about any changes, no matter how small.  If you are in your 20s or 30s, you should have a clinical breast exam (CBE) by a health care provider every 1-3 years as part of a regular health exam.  If you are 40 or older, have a CBE every year. Also consider having a breast X-ray (mammogram) every year.  If you have a family history of breast cancer, talk to your health care provider about genetic screening.  If you   are at high risk for breast cancer, talk to your health care provider about having an MRI and a mammogram every year.  Breast cancer gene (BRCA) assessment is recommended for women who have family members with BRCA-related cancers. BRCA-related cancers include:  Breast.  Ovarian.  Tubal.  Peritoneal cancers.  Results of the assessment will determine the need for genetic counseling and BRCA1 and BRCA2 testing. Cervical Cancer Your health care provider may recommend that you be screened regularly for cancer of the pelvic organs (ovaries, uterus, and  vagina). This screening involves a pelvic examination, including checking for microscopic changes to the surface of your cervix (Pap test). You may be encouraged to have this screening done every 3 years, beginning at age 21.  For women ages 30-65, health care providers may recommend pelvic exams and Pap testing every 3 years, or they may recommend the Pap and pelvic exam, combined with testing for human papilloma virus (HPV), every 5 years. Some types of HPV increase your risk of cervical cancer. Testing for HPV may also be done on women of any age with unclear Pap test results.  Other health care providers may not recommend any screening for nonpregnant women who are considered low risk for pelvic cancer and who do not have symptoms. Ask your health care provider if a screening pelvic exam is right for you.  If you have had past treatment for cervical cancer or a condition that could lead to cancer, you need Pap tests and screening for cancer for at least 20 years after your treatment. If Pap tests have been discontinued, your risk factors (such as having a new sexual partner) need to be reassessed to determine if screening should resume. Some women have medical problems that increase the chance of getting cervical cancer. In these cases, your health care provider may recommend more frequent screening and Pap tests. Colorectal Cancer  This type of cancer can be detected and often prevented.  Routine colorectal cancer screening usually begins at 61 years of age and continues through 61 years of age.  Your health care provider may recommend screening at an earlier age if you have risk factors for colon cancer.  Your health care provider may also recommend using home test kits to check for hidden blood in the stool.  A small camera at the end of a tube can be used to examine your colon directly (sigmoidoscopy or colonoscopy). This is done to check for the earliest forms of colorectal  cancer.  Routine screening usually begins at age 50.  Direct examination of the colon should be repeated every 5-10 years through 61 years of age. However, you may need to be screened more often if early forms of precancerous polyps or small growths are found. Skin Cancer  Check your skin from head to toe regularly.  Tell your health care provider about any new moles or changes in moles, especially if there is a change in a mole's shape or color.  Also tell your health care provider if you have a mole that is larger than the size of a pencil eraser.  Always use sunscreen. Apply sunscreen liberally and repeatedly throughout the day.  Protect yourself by wearing long sleeves, pants, a wide-brimmed hat, and sunglasses whenever you are outside. HEART DISEASE, DIABETES, AND HIGH BLOOD PRESSURE   High blood pressure causes heart disease and increases the risk of stroke. High blood pressure is more likely to develop in:  People who have blood pressure in the high end   of the normal range (130-139/85-89 mm Hg).  People who are overweight or obese.  People who are African American.  If you are 38-23 years of age, have your blood pressure checked every 3-5 years. If you are 61 years of age or older, have your blood pressure checked every year. You should have your blood pressure measured twice--once when you are at a hospital or clinic, and once when you are not at a hospital or clinic. Record the average of the two measurements. To check your blood pressure when you are not at a hospital or clinic, you can use:  An automated blood pressure machine at a pharmacy.  A home blood pressure monitor.  If you are between 45 years and 39 years old, ask your health care provider if you should take aspirin to prevent strokes.  Have regular diabetes screenings. This involves taking a blood sample to check your fasting blood sugar level.  If you are at a normal weight and have a low risk for diabetes,  have this test once every three years after 61 years of age.  If you are overweight and have a high risk for diabetes, consider being tested at a younger age or more often. PREVENTING INFECTION  Hepatitis B  If you have a higher risk for hepatitis B, you should be screened for this virus. You are considered at high risk for hepatitis B if:  You were born in a country where hepatitis B is common. Ask your health care provider which countries are considered high risk.  Your parents were born in a high-risk country, and you have not been immunized against hepatitis B (hepatitis B vaccine).  You have HIV or AIDS.  You use needles to inject street drugs.  You live with someone who has hepatitis B.  You have had sex with someone who has hepatitis B.  You get hemodialysis treatment.  You take certain medicines for conditions, including cancer, organ transplantation, and autoimmune conditions. Hepatitis C  Blood testing is recommended for:  Everyone born from [birth year] through 1966.  Anyone with known risk factors for hepatitis C. Sexually transmitted infections (STIs)  You should be screened for sexually transmitted infections (STIs) including gonorrhea and chlamydia if:  You are sexually active and are younger than 61 years of age.  You are older than 61 years of age and your health care provider tells you that you are at risk for this type of infection.  Your sexual activity has changed since you were last screened and you are at an increased risk for chlamydia or gonorrhea. Ask your health care provider if you are at risk.  If you do not have HIV, but are at risk, it may be recommended that you take a prescription medicine daily to prevent HIV infection. This is called pre-exposure prophylaxis (PrEP). You are considered at risk if:  You are sexually active and do not regularly use condoms or know the HIV status of your partner(s).  You take drugs by injection.  You are sexually  active with a partner who has HIV. Talk with your health care provider about whether you are at high risk of being infected with HIV. If you choose to begin PrEP, you should first be tested for HIV. You should then be tested every 3 months for as long as you are taking PrEP.  PREGNANCY   If you are premenopausal and you may become pregnant, ask your health care provider about preconception counseling.  If you may  become pregnant, take 400 to 800 micrograms (mcg) of folic acid every day.  If you want to prevent pregnancy, talk to your health care provider about birth control (contraception). OSTEOPOROSIS AND MENOPAUSE   Osteoporosis is a disease in which the bones lose minerals and strength with aging. This can result in serious bone fractures. Your risk for osteoporosis can be identified using a bone density scan.  If you are 52 years of age or older, or if you are at risk for osteoporosis and fractures, ask your health care provider if you should be screened.  Ask your health care provider whether you should take a calcium or vitamin D supplement to lower your risk for osteoporosis.  Menopause may have certain physical symptoms and risks.  Hormone replacement therapy may reduce some of these symptoms and risks. Talk to your health care provider about whether hormone replacement therapy is right for you.  HOME CARE INSTRUCTIONS   Schedule regular health, dental, and eye exams.  Stay current with your immunizations.   Do not use any tobacco products including cigarettes, chewing tobacco, or electronic cigarettes.  If you are pregnant, do not drink alcohol.  If you are breastfeeding, limit how much and how often you drink alcohol.  Limit alcohol intake to no more than 1 drink per day for nonpregnant women. One drink equals 12 ounces of beer, 5 ounces of wine, or 1 ounces of hard liquor.  Do not use street drugs.  Do not share needles.  Ask your health care provider for help if  you need support or information about quitting drugs.  Tell your health care provider if you often feel depressed.  Tell your health care provider if you have ever been abused or do not feel safe at home.   This information is not intended to replace advice given to you by your health care provider. Make sure you discuss any questions you have with your health care provider.   Document Released: 09/23/2010 Document Revised: 03/31/2014 Document Reviewed: 02/09/2013 Elsevier Interactive Patient Education [employer].  Dermatology referral today.  Fasting lab appt, and other labs collected at that time.  Shingles vaccination provided by script today. Please inform us if you have decided to receive.

## 2015-07-03 DIAGNOSIS — M25551 Pain in right hip: Secondary | ICD-10-CM | POA: Diagnosis not present

## 2015-07-03 DIAGNOSIS — M9907 Segmental and somatic dysfunction of upper extremity: Secondary | ICD-10-CM | POA: Diagnosis not present

## 2015-07-03 DIAGNOSIS — G500 Trigeminal neuralgia: Secondary | ICD-10-CM | POA: Diagnosis not present

## 2015-07-03 DIAGNOSIS — M9908 Segmental and somatic dysfunction of rib cage: Secondary | ICD-10-CM | POA: Diagnosis not present

## 2015-07-03 DIAGNOSIS — G5643 Causalgia of bilateral upper limbs: Secondary | ICD-10-CM | POA: Diagnosis not present

## 2015-07-03 DIAGNOSIS — M9901 Segmental and somatic dysfunction of cervical region: Secondary | ICD-10-CM | POA: Diagnosis not present

## 2015-07-03 DIAGNOSIS — R0781 Pleurodynia: Secondary | ICD-10-CM | POA: Diagnosis not present

## 2015-07-03 DIAGNOSIS — M9900 Segmental and somatic dysfunction of head region: Secondary | ICD-10-CM | POA: Diagnosis not present

## 2015-07-03 DIAGNOSIS — M791 Myalgia: Secondary | ICD-10-CM | POA: Diagnosis not present

## 2015-07-03 DIAGNOSIS — M9905 Segmental and somatic dysfunction of pelvic region: Secondary | ICD-10-CM | POA: Diagnosis not present

## 2015-07-04 ENCOUNTER — Ambulatory Visit: Payer: Medicare Other | Admitting: Family Medicine

## 2015-07-05 ENCOUNTER — Other Ambulatory Visit: Payer: Medicare Other

## 2015-07-05 ENCOUNTER — Ambulatory Visit (INDEPENDENT_AMBULATORY_CARE_PROVIDER_SITE_OTHER): Payer: Medicare Other | Admitting: Obstetrics and Gynecology

## 2015-07-05 ENCOUNTER — Encounter: Payer: Self-pay | Admitting: Family Medicine

## 2015-07-05 VITALS — BP 122/62 | HR 72 | Resp 16 | Ht 68.0 in | Wt 212.0 lb

## 2015-07-05 DIAGNOSIS — R32 Unspecified urinary incontinence: Secondary | ICD-10-CM | POA: Diagnosis not present

## 2015-07-05 NOTE — Progress Notes (Signed)
Patient in today for a urine check. Patient's urine is clear and patient has no problems.

## 2015-07-11 ENCOUNTER — Ambulatory Visit (INDEPENDENT_AMBULATORY_CARE_PROVIDER_SITE_OTHER): Payer: Medicare Other | Admitting: Obstetrics and Gynecology

## 2015-07-11 VITALS — BP 118/80 | HR 78 | Resp 18 | Wt 207.0 lb

## 2015-07-11 DIAGNOSIS — N398 Other specified disorders of urinary system: Secondary | ICD-10-CM

## 2015-07-11 DIAGNOSIS — N816 Rectocele: Secondary | ICD-10-CM | POA: Diagnosis not present

## 2015-07-11 DIAGNOSIS — N393 Stress incontinence (female) (male): Secondary | ICD-10-CM

## 2015-07-11 NOTE — Progress Notes (Signed)
Diana Gross is a 60 y.o. female Who presents today for urodynamics testing, ordered by Dr. Quincy Simmonds.   Allergies and medications reviewed.  Denies complaints today. No urinary complaints.   Urine Micro exam: negative for WBC's or RBC's, okay to proceed per Dr. Quincy Simmonds.  Patient reports urinary leakage with laughing.    Urodynamics testing initiated. Lumax Bladder Catheter #10 [place] and lumax Abdominal Catheter #10 [place].   Post void residual 20 ml.   Urethral catheter placed without issue. Rectal catheter placed without issue.   Urodynamics testing completed. Please see scanned Patient summary report in Epic. Procedure completed and patient tolerated well without complaints. Patient scheduled for follow up office visit with Dr. Quincy Simmonds to discuss results. Patient agreeable.   Patient given post procedure instructions and verbalized understanding. AVS printed. You may have a mild bladder and rectal discomfort for a few hours after the test. You may experience some frequent urination and slight burning the first few times you urinate after the test. Rarely, the urine may be blood tinged. These are both due to catheter placements and resolve quickly. You should call our office immediately if you have signs of infection, which may include bladder pain, urinary urgency, fever, or burning during urination. We do encourage you to drink plenty of water after the test.

## 2015-07-11 NOTE — Patient Instructions (Signed)
After your procedure:   You may have a mild bladder and rectal discomfort for a few hours after the test. . You may experience some frequent urination and slight burning the first few times you urinate after the test. Rarely, the urine may be blood tinged. These are both due to catheter placements and resolve quickly.  . You should call our office immediately if you have signs of infection, which may include bladder pain, urinary urgency, fever, or burning during urination. .  We do encourage you to drink plenty of water after completion of the test today.   Please call our office with any concerns or questions.   [facility] [address], [address] [place], [place] [address] PH:  [phone]; Fax:  [phone] 

## 2015-07-11 NOTE — Progress Notes (Signed)
Encounter reviewed by Dr. Brook Amundson C. Silva.  

## 2015-07-13 ENCOUNTER — Other Ambulatory Visit (INDEPENDENT_AMBULATORY_CARE_PROVIDER_SITE_OTHER): Payer: Medicare Other

## 2015-07-13 DIAGNOSIS — Z0000 Encounter for general adult medical examination without abnormal findings: Secondary | ICD-10-CM | POA: Diagnosis not present

## 2015-07-13 LAB — BASIC METABOLIC PANEL
BUN: 9 mg/dL (ref 6–23)
CHLORIDE: 102 meq/L (ref 96–112)
CO2: 28 meq/L (ref 19–32)
Calcium: 9.2 mg/dL (ref 8.4–10.5)
Creatinine, Ser: 0.74 mg/dL (ref 0.40–1.20)
GFR: 84.78 mL/min (ref >60.00)
GLUCOSE: 104 mg/dL — AB (ref 70–99)
POTASSIUM: 3.6 meq/L (ref 3.5–5.1)
Sodium: 138 mEq/L (ref 135–145)

## 2015-07-13 LAB — CBC WITH DIFFERENTIAL/PLATELET
BASOS ABS: 0.1 10*3/uL (ref 0.0–0.1)
Basophils Relative: 1.1 % (ref 0.0–3.0)
Eosinophils Absolute: 0.1 10*3/uL (ref 0.0–0.7)
Eosinophils Relative: 1.6 % (ref 0.0–5.0)
HEMATOCRIT: 44.8 % (ref 36.0–46.0)
HEMOGLOBIN: 15.1 g/dL — AB (ref 12.0–15.0)
LYMPHS PCT: 43 % (ref 12.0–46.0)
Lymphs Abs: 2.3 10*3/uL (ref 0.7–4.0)
MCHC: 33.6 g/dL (ref 30.0–36.0)
MCV: 87.8 fl (ref 78.0–100.0)
MONOS PCT: 6 % (ref 3.0–12.0)
Monocytes Absolute: 0.3 10*3/uL (ref 0.1–1.0)
NEUTROS ABS: 2.6 10*3/uL (ref 1.4–7.7)
Neutrophils Relative %: 48.3 % (ref 43.0–77.0)
PLATELETS: 245 10*3/uL (ref 150.0–400.0)
RBC: 5.11 Mil/uL (ref 3.87–5.11)
RDW: 12.9 % (ref 11.5–15.5)
WBC: 5.5 10*3/uL (ref 4.0–10.5)

## 2015-07-13 LAB — LIPID PANEL
CHOL/HDL RATIO: 4
Cholesterol: 194 mg/dL (ref 0–200)
HDL: 46.9 mg/dL (ref >39.00)
LDL CALC: 118 mg/dL — AB (ref 0–99)
NONHDL: 147.57
Triglycerides: 150 mg/dL — ABNORMAL HIGH (ref 0.0–149.0)
VLDL: 30 mg/dL (ref 0.0–40.0)

## 2015-07-13 LAB — TSH: TSH: 1.64 u[IU]/mL (ref 0.35–4.50)

## 2015-07-16 ENCOUNTER — Telehealth: Payer: Self-pay | Admitting: *Deleted

## 2015-07-16 ENCOUNTER — Ambulatory Visit (INDEPENDENT_AMBULATORY_CARE_PROVIDER_SITE_OTHER): Payer: Medicare Other | Admitting: Obstetrics and Gynecology

## 2015-07-16 ENCOUNTER — Encounter: Payer: Self-pay | Admitting: Obstetrics and Gynecology

## 2015-07-16 ENCOUNTER — Telehealth: Payer: Self-pay | Admitting: Family Medicine

## 2015-07-16 VITALS — BP 112/64 | HR 68 | Resp 16 | Ht 68.5 in | Wt 207.0 lb

## 2015-07-16 DIAGNOSIS — N8110 Cystocele, unspecified: Secondary | ICD-10-CM

## 2015-07-16 DIAGNOSIS — N398 Other specified disorders of urinary system: Secondary | ICD-10-CM

## 2015-07-16 DIAGNOSIS — N816 Rectocele: Secondary | ICD-10-CM | POA: Diagnosis not present

## 2015-07-16 DIAGNOSIS — N393 Stress incontinence (female) (male): Secondary | ICD-10-CM

## 2015-07-16 DIAGNOSIS — IMO0002 Reserved for concepts with insufficient information to code with codable children: Secondary | ICD-10-CM

## 2015-07-16 NOTE — Telephone Encounter (Signed)
Please call patient: - Her labs are stable.
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
## 2015-07-16 NOTE — Telephone Encounter (Addendum)
Patient was seen today is needing a refill of her estrogen cream to [facility] she is almost out patient would also like a callback please when done. Best # to reach: [phone]  Encounter closed in error

## 2015-07-16 NOTE — Telephone Encounter (Signed)
Left message with lab results on patient voice mail 

## 2015-07-16 NOTE — Patient Instructions (Signed)
Consider Colace 100 mg by mouth twice a day or daily Miralax for constipation symptoms.

## 2015-07-17 ENCOUNTER — Other Ambulatory Visit: Payer: Self-pay | Admitting: *Deleted

## 2015-07-17 MED ORDER — ESTROGENS, CONJUGATED 0.625 MG/GM VA CREA: 1.0000 | TOPICAL_CREAM | VAGINAL | Status: DC

## 2015-07-17 NOTE — Telephone Encounter (Signed)
Ok for Premarin cream 1/2 gram pv at hs 2 times per week.  Dispense:  30 grams, RF none.  Please sent to pharmacy of choice.

## 2015-07-17 NOTE — Addendum Note (Signed)
Addended by: Susanne Greenhouse E on: 07/17/2015 12:41 PM   Modules accepted: Orders

## 2015-07-17 NOTE — Telephone Encounter (Signed)
  Patient is aware that we sent in the RX to her pharmacy -eh

## 2015-07-17 NOTE — Telephone Encounter (Addendum)
Medication refill request: Estrogen cream ( I do not see a cream on her med list)  Last AEX: Never New Patient: 05-17-15 Next AEX: not scheduled  Last MMG (if hormonal medication request): 10-25-14 WNL  Refill authorized: please advise

## 2015-07-18 DIAGNOSIS — G894 Chronic pain syndrome: Secondary | ICD-10-CM | POA: Diagnosis not present

## 2015-07-18 DIAGNOSIS — M5481 Occipital neuralgia: Secondary | ICD-10-CM | POA: Diagnosis not present

## 2015-07-18 DIAGNOSIS — G43019 Migraine without aura, intractable, without status migrainosus: Secondary | ICD-10-CM | POA: Diagnosis not present

## 2015-07-18 DIAGNOSIS — G243 Spasmodic torticollis: Secondary | ICD-10-CM | POA: Diagnosis not present

## 2015-07-18 DIAGNOSIS — Z79891 Long term (current) use of opiate analgesic: Secondary | ICD-10-CM | POA: Diagnosis not present

## 2015-07-18 DIAGNOSIS — Z79899 Other long term (current) drug therapy: Secondary | ICD-10-CM | POA: Diagnosis not present

## 2015-07-19 ENCOUNTER — Encounter: Payer: Self-pay | Admitting: Obstetrics and Gynecology

## 2015-07-19 NOTE — Progress Notes (Signed)
Multichannel urodynamic testing  Uroflow testing - void 15 cc, PVR 20 cc. Intermittent pattern.  CMG - S1 164 cc, S2 273 cc, S3 409 cc.  Cystometric capacity 420 cc.  LPP 101 cm H2O at 420 cc. Stable CMG. UPP - 21 cm H2O. Pressure flow study - Pdet max 225 cm H2O.  Voiding time 3 min 54 sec. Voided 452 cc.  Patient used hand to reduce cystocele for pressure flow study.   Genuine stress incontinence.  Abnormal voiding pattern with artifact due to manual manipulation to void on the part of the patient.

## 2015-07-31 DIAGNOSIS — M9905 Segmental and somatic dysfunction of pelvic region: Secondary | ICD-10-CM | POA: Diagnosis not present

## 2015-07-31 DIAGNOSIS — M791 Myalgia: Secondary | ICD-10-CM | POA: Diagnosis not present

## 2015-07-31 DIAGNOSIS — G500 Trigeminal neuralgia: Secondary | ICD-10-CM | POA: Diagnosis not present

## 2015-07-31 DIAGNOSIS — M9901 Segmental and somatic dysfunction of cervical region: Secondary | ICD-10-CM | POA: Diagnosis not present

## 2015-07-31 DIAGNOSIS — M9907 Segmental and somatic dysfunction of upper extremity: Secondary | ICD-10-CM | POA: Diagnosis not present

## 2015-07-31 DIAGNOSIS — M9900 Segmental and somatic dysfunction of head region: Secondary | ICD-10-CM | POA: Diagnosis not present

## 2015-07-31 DIAGNOSIS — M9903 Segmental and somatic dysfunction of lumbar region: Secondary | ICD-10-CM | POA: Diagnosis not present

## 2015-07-31 DIAGNOSIS — M25511 Pain in right shoulder: Secondary | ICD-10-CM | POA: Diagnosis not present

## 2015-07-31 DIAGNOSIS — M545 Low back pain: Secondary | ICD-10-CM | POA: Diagnosis not present

## 2015-07-31 DIAGNOSIS — R0781 Pleurodynia: Secondary | ICD-10-CM | POA: Diagnosis not present

## 2015-07-31 DIAGNOSIS — G8929 Other chronic pain: Secondary | ICD-10-CM | POA: Diagnosis not present

## 2015-07-31 DIAGNOSIS — M25551 Pain in right hip: Secondary | ICD-10-CM | POA: Diagnosis not present

## 2015-07-31 DIAGNOSIS — M9908 Segmental and somatic dysfunction of rib cage: Secondary | ICD-10-CM | POA: Diagnosis not present

## 2015-08-22 DIAGNOSIS — M5412 Radiculopathy, cervical region: Secondary | ICD-10-CM | POA: Diagnosis not present

## 2015-08-22 DIAGNOSIS — G894 Chronic pain syndrome: Secondary | ICD-10-CM | POA: Diagnosis not present

## 2015-08-22 DIAGNOSIS — G500 Trigeminal neuralgia: Secondary | ICD-10-CM | POA: Diagnosis not present

## 2015-09-04 DIAGNOSIS — M9901 Segmental and somatic dysfunction of cervical region: Secondary | ICD-10-CM | POA: Diagnosis not present

## 2015-09-04 DIAGNOSIS — G500 Trigeminal neuralgia: Secondary | ICD-10-CM | POA: Diagnosis not present

## 2015-09-04 DIAGNOSIS — R0781 Pleurodynia: Secondary | ICD-10-CM | POA: Diagnosis not present

## 2015-09-04 DIAGNOSIS — M791 Myalgia: Secondary | ICD-10-CM | POA: Diagnosis not present

## 2015-09-04 DIAGNOSIS — M9908 Segmental and somatic dysfunction of rib cage: Secondary | ICD-10-CM | POA: Diagnosis not present

## 2015-09-04 DIAGNOSIS — M9907 Segmental and somatic dysfunction of upper extremity: Secondary | ICD-10-CM | POA: Diagnosis not present

## 2015-09-04 DIAGNOSIS — M9900 Segmental and somatic dysfunction of head region: Secondary | ICD-10-CM | POA: Diagnosis not present

## 2015-09-04 DIAGNOSIS — M26629 Arthralgia of temporomandibular joint, unspecified side: Secondary | ICD-10-CM | POA: Diagnosis not present

## 2015-09-04 DIAGNOSIS — G5643 Causalgia of bilateral upper limbs: Secondary | ICD-10-CM | POA: Diagnosis not present

## 2015-09-09 ENCOUNTER — Other Ambulatory Visit: Payer: Self-pay | Admitting: Family Medicine

## 2015-09-10 ENCOUNTER — Other Ambulatory Visit: Payer: Self-pay | Admitting: Obstetrics and Gynecology

## 2015-09-10 DIAGNOSIS — H524 Presbyopia: Secondary | ICD-10-CM | POA: Diagnosis not present

## 2015-09-10 NOTE — Telephone Encounter (Signed)
Patient left a message asking for a refill of Estrdiol to be sent to the pharmacy on file.

## 2015-09-10 NOTE — Telephone Encounter (Signed)
Medication refill request: Estradiol  Last AEX:  Never Last OV: 07-16-15  Next AEX: not scheduled  Last MMG (if hormonal medication request): 10-25-14 WNL  Refill authorized: please advise

## 2015-09-11 NOTE — Telephone Encounter (Signed)
Left message for patient to call and schedule AEX -eh

## 2015-09-11 NOTE — Telephone Encounter (Signed)
Rx declined.  Please schedule patient for an annual well woman visit.  She has been seen for problem visits only.  Please explain the Medicare policy to patient. If she is unable to come in for a visit with me, she may ask her PCP for this prescription.  It looks like she has already had a month refill from them a couple of days ago.   I am happy to see her again!

## 2015-09-25 ENCOUNTER — Encounter: Payer: Self-pay | Admitting: Obstetrics and Gynecology

## 2015-09-26 DIAGNOSIS — G5643 Causalgia of bilateral upper limbs: Secondary | ICD-10-CM | POA: Diagnosis not present

## 2015-09-26 DIAGNOSIS — M791 Myalgia: Secondary | ICD-10-CM | POA: Diagnosis not present

## 2015-09-26 DIAGNOSIS — G500 Trigeminal neuralgia: Secondary | ICD-10-CM | POA: Diagnosis not present

## 2015-09-26 DIAGNOSIS — M9901 Segmental and somatic dysfunction of cervical region: Secondary | ICD-10-CM | POA: Diagnosis not present

## 2015-09-26 DIAGNOSIS — M9907 Segmental and somatic dysfunction of upper extremity: Secondary | ICD-10-CM | POA: Diagnosis not present

## 2015-09-26 DIAGNOSIS — M9908 Segmental and somatic dysfunction of rib cage: Secondary | ICD-10-CM | POA: Diagnosis not present

## 2015-09-26 DIAGNOSIS — M9900 Segmental and somatic dysfunction of head region: Secondary | ICD-10-CM | POA: Diagnosis not present

## 2015-09-26 DIAGNOSIS — M26629 Arthralgia of temporomandibular joint, unspecified side: Secondary | ICD-10-CM | POA: Diagnosis not present

## 2015-09-26 DIAGNOSIS — R0781 Pleurodynia: Secondary | ICD-10-CM | POA: Diagnosis not present

## 2015-09-27 NOTE — Telephone Encounter (Signed)
Message added to refill request and routed to Dr. Quincy Simmonds

## 2015-09-27 NOTE — Telephone Encounter (Signed)
Dr. Quincy Simmonds,  Patient sent this message via mychart in response to telephone message:    ----- Message -----    From: Diana Gross    Sent: 09/25/2015 11:30 AM EDT      To: Arloa Koh, MD Subject: Non-Urgent Medical Question  Hi, I had a phone message from [place], I believe, stating that you could not call in my HRT patches until I have an annual exam. I saw Dr. Eyvonne Mechanic in late February for annual exam, with Pap smear and came back for tests she recommended, and I saw Dr Raoul Pitch this year for Medicare exam and blood work. I do need to schedule my annual mammaogram in August, which I will do. Last month  Dr. Raoul Pitch called in a three mont supply of my patches, but since Dr Eyvonne Mechanic is my gynecologist, I would appreciate her prescribing them for the rest of this year as well as the estrogen cream that I use vaginally for a prolapse issue.  I do not need another exam/Pap smear this year. Please check your records and let me know if Dr. Eyvonne Mechanic is willing to prescribe the patches and cream which I have been on for years. Thank you so much.

## 2015-09-27 NOTE — Telephone Encounter (Signed)
Patient has had problem visits with me, but not a well woman visit and discussion of HRT - risks, benefits, etc.  She will need to return for a well woman visit.  It seems she has her prescription until September, so perhaps she would like to schedule an appointment for before the prescription runs out.

## 2015-09-28 NOTE — Telephone Encounter (Signed)
Dr. Quincy Simmonds responded to patient via mychart regarding message.

## 2015-10-01 DIAGNOSIS — G501 Atypical facial pain: Secondary | ICD-10-CM | POA: Diagnosis not present

## 2015-10-01 DIAGNOSIS — M5412 Radiculopathy, cervical region: Secondary | ICD-10-CM | POA: Diagnosis not present

## 2015-10-01 DIAGNOSIS — G500 Trigeminal neuralgia: Secondary | ICD-10-CM | POA: Diagnosis not present

## 2015-10-01 DIAGNOSIS — G894 Chronic pain syndrome: Secondary | ICD-10-CM | POA: Diagnosis not present

## 2015-10-10 ENCOUNTER — Encounter: Payer: Self-pay | Admitting: Obstetrics and Gynecology

## 2015-10-10 ENCOUNTER — Ambulatory Visit (INDEPENDENT_AMBULATORY_CARE_PROVIDER_SITE_OTHER): Payer: Medicare Other | Admitting: Obstetrics and Gynecology

## 2015-10-10 VITALS — BP 146/76 | HR 76 | Resp 14 | Ht 68.0 in | Wt 213.4 lb

## 2015-10-10 DIAGNOSIS — N398 Other specified disorders of urinary system: Secondary | ICD-10-CM

## 2015-10-10 DIAGNOSIS — Z124 Encounter for screening for malignant neoplasm of cervix: Secondary | ICD-10-CM

## 2015-10-10 DIAGNOSIS — Z01419 Encounter for gynecological examination (general) (routine) without abnormal findings: Secondary | ICD-10-CM

## 2015-10-10 DIAGNOSIS — Z79899 Other long term (current) drug therapy: Secondary | ICD-10-CM

## 2015-10-10 NOTE — Progress Notes (Signed)
Patient ID: Diana Gross, female   DOB: [birth year], 60 y.o.   MRN: [id number] 60 y.o. [id number] Married Caucasian female here for annual exam.    Tried Impressa and this did not help.  Did not have any change in her urinary stream.  Still feels needs to press to void.   Using cod liver oil once a week to facilitate BMs. She has reduced this upon my recommendation.   Taking ERT. Climara patch 0.05 mg once weekly and Premarin vaginal cream.  Tried to wean off and had emotional changes, decreased energy, and trigeminal neuralgia symptoms in her face. Does not tolerate oral estrogen well.   Has enough oral and vaginal estrogen for another two months.   PCP: [facility]    No LMP recorded. Patient has had a hysterectomy.           Sexually active: Yes.   female The current method of family planning is status post hysterectomy/BSO.    Exercising: Yes.    stretching. Smoker:  no  Health Maintenance: Pap:  2015 Normal per patient History of abnormal Pap:  Yes, Hx cryotherapy to cervix 1989; paps normal since. MMG:  10/2014 dense breasts,normal:[place] [facility]:  Age 51 to 53 normal in [place]. BMD:  2010 Result  Normal in [place]: 2010 ?[place]:   N/A HIV: negative in 2010.  Hep C: will do with PCP.  Screening Labs:  Hb today: PCP, Urine today: unable   reports that she has never smoked. She has never used smokeless tobacco. She reports that she does not drink alcohol or use illicit drugs.  Past Medical History  Diagnosis Date  . Allergy   . Anxiety   . Arthritis     right hip  . Depression   . Hx of migraines   . Urinary incontinence   . TMJ (dislocation of temporomandibular joint)   . Vaginal prolapse 2016    cystocele and rectocele  . Basal cell carcinoma 2015    Right shin.   . Neuromuscular disorder (HCC)     fibromyalgia, myofasical pain  . Complex regional pain syndrome   . Epidemic cervical myalgia   . Trigeminal neuralgia   .  Occipital neuralgia   . Tic douloureux   . Cervical pain   . History of cranial surgery     with "complications" which has caused chronic pain   . BCC (basal cell carcinoma of skin)   . Heart murmur     Grade I  . Fibroid     reason for Hysterectomy  . Abnormal uterine bleeding     prior to hysterectomy  . Anemia     prior to hysterectomy  . History of abnormal cervical Pap smear 1989    --hx colpo/cryotherapy to cervix     Past Surgical History  Procedure Laterality Date  . Urethral dilation  1974  . Craniectomy  2010    due to Trigeminal neuralgia at [facility]  . Urethral diverticulum repair  1992  . Appendectomy  1972  . Cholecystectomy  1995  . Vaginal hysterectomy  1997    Current Outpatient Prescriptions  Medication Sig Dispense Refill  . ALPRAZolam (XANAX) 1 MG tablet   0  . b complex vitamins tablet Take 1 tablet by mouth daily.    . Cholecalciferol (D3 SUPER STRENGTH) 2000 units CAPS Take 2,000 Units by mouth daily.    . COD LIVER OIL PO Take by mouth as needed.     . conjugated  estrogens (PREMARIN) vaginal cream Place 1 Applicatorful vaginally 2 (two) times a week. Place 1/2 gram vaginally QHS 2 X per week 42.5 g 0  . diphenhydrAMINE (BENADRYL) 25 mg capsule Take 25 mg by mouth at bedtime.     Marland Kitchen estradiol (CLIMARA - DOSED IN MG/24 HR) 0.05 mg/24hr patch PLACE 1 PATCH ONTO THE SKIN ONCE A WEEK. 4 patch 2  . loperamide (IMODIUM A-D) 2 MG tablet Dispense in original package and use according to package directions    . magnesium 30 MG tablet Take 30 mg by mouth 2 (two) times daily.    . Misc Natural Products (TART CHERRY ADVANCED PO) Take by mouth.    . Nystatin (NYAMYC) 100000 UNIT/GM POWD Apply topically 2 (two) times daily.    . ondansetron (ZOFRAN-ODT) 4 MG disintegrating tablet Take 4 mg by mouth every 8 (eight) hours as needed for nausea or vomiting.    Marland Kitchen oxyCODONE (OXYCONTIN) 10 mg 12 hr tablet Take 10 mg by mouth at bedtime.    Marland Kitchen oxyCODONE (OXYCONTIN) 20 mg 12 hr  tablet Only takes for break through pain  0  . oxyCODONE (ROXICODONE) 15 MG immediate release tablet Take 15 mg by mouth every 4 (four) hours as needed.  0  . ranitidine (ZANTAC 75) 75 MG tablet Take by mouth as needed.     . SUMAtriptan (IMITREX) 50 MG tablet   0  . zolpidem (AMBIEN) 10 MG tablet Take by mouth.    . zoster vaccine live, PF, (ZOSTAVAX) 09811 UNT/0.65ML injection Inject 19,400 Units into the skin once. (Patient not taking: Reported on 10/10/2015) 1 each 0   No current facility-administered medications for this visit.    Family History  Problem Relation Age of Onset  . Breast cancer Mother 12  . Diabetes Mellitus II Mother   . Diabetes Mother 31    Type 2  . Hyperlipidemia Mother   . Aplastic anemia Brother 12    passed away age 64 with Leukemia  . Testicular cancer Son 28  . Cancer Paternal Uncle 77    "Spinal"  . Heart disease Father     Dec 72 CHF  . Alcoholism Father   . Hypertension Father   . Alcoholism Sister   . Migraines Sister   . Stroke Maternal Grandmother     dec age 66    ROS:  Pertinent items are noted in HPI.  Otherwise, a comprehensive ROS was negative.  Exam:   BP 146/76 mmHg  Pulse 76  Resp 14  Ht 5\' 8"  (1.727 m)  Wt 213 lb 6.4 oz (96.798 kg)  BMI 32.46 kg/m2    General appearance: alert, cooperative and appears stated age Head: Normocephalic, without obvious abnormality, atraumatic Neck: no adenopathy, supple, symmetrical, trachea midline and thyroid normal to inspection and palpation Lungs: clear to auscultation bilaterally Breasts: normal appearance, no masses or tenderness, Inspection negative, No nipple retraction or dimpling, No nipple discharge or bleeding, No axillary or supraclavicular adenopathy Heart: regular rate and rhythm Abdomen  soft, non-tender; no masses, no organomegaly Extremities: extremities normal, atraumatic, no cyanosis or edema Skin: Skin color, texture, turgor normal. No rashes or lesions Lymph nodes:  Cervical, supraclavicular, and axillary nodes normal. No abnormal inguinal nodes palpated Neurologic: Grossly normal  Pelvic: External genitalia:  no lesions              Urethra:  normal appearing urethra with no masses, tenderness or lesions  Bartholins and Skenes: normal                 Vagina: normal appearing vagina with normal color and discharge, no lesions.  Minimal cystocele.  Third degree rectocele.               Cervix: absent              Pap taken: No. Bimanual Exam:  Uterus:  uterus absent              Adnexa: no mass, fullness, tenderness              Rectal exam: Yes.  .  Confirms.              Anus:  normal sphincter tone, no lesions  Chaperone was present for exam.  Assessment:    Well woman visit with normal exam. Status post hysterectomy/BSO. ERT patient.  FH of breast cancer in mother.  Status post urethral diverticulum repair.  Status post urethral dilation. Minimal cystocele and third degree rectocele.  Genuine stress incontinence at max bladder capacity with urodynamic testing.  Voiding dysfunction. Possible retention. Narcotic use may be affecting voiding. Urodynamic testing unable to assess voiding as patient did forceful manipulation to void at the end of the urodynamic study.  Chronic pain and use of narcotics. Complex medical history.   Plan: Yearly mammogram recommended after age 44.   She will schedule at the [facility].  Recommended self breast exam.  Pap and HR HPV as above. Guidelines for  Calcium, Vitamin D, regular exercise program including cardiovascular and weight bearing exercise. Labs performed.  No..     Prescription medication(s) given.  No..   We did discuss ERT in detail - risks and benefits.  Risks include DVT, PE, MI, stroke, and breast cancer.   She may try weaning off ERT when the weather becomes cooler.   Try to avoid using cod liver oil for bowel function.  We discussed using increased fiber, yogurt in her  diet.  I am referring her to Dr. Nicki Reaper MacDiarmid for evaluation and treatment of voiding dysfunction and stress incontinence.  Follow up annually and prn.   After visit summary provided.

## 2015-10-10 NOTE — Patient Instructions (Signed)
Health Maintenance, Female Adopting a healthy lifestyle and getting preventive care can go a long way to promote health and wellness. Talk with your health care provider about what schedule of regular examinations is right for you. This is a good chance for you to check in with your provider about disease prevention and staying healthy. In between checkups, there are plenty of things you can do on your own. Experts have done a lot of research about which lifestyle changes and preventive measures are most likely to keep you healthy. Ask your health care provider for more information. WEIGHT AND DIET  Eat a healthy diet  Be sure to include plenty of vegetables, fruits, low-fat dairy products, and lean protein.  Do not eat a lot of foods high in solid fats, added sugars, or salt.  Get regular exercise. This is one of the most important things you can do for your health.  Most adults should exercise for at least 150 minutes each week. The exercise should increase your heart rate and make you sweat (moderate-intensity exercise).  Most adults should also do strengthening exercises at least twice a week. This is in addition to the moderate-intensity exercise.  Maintain a healthy weight  Body mass index (BMI) is a measurement that can be used to identify possible weight problems. It estimates body fat based on height and weight. Your health care provider can help determine your BMI and help you achieve or maintain a healthy weight.  For females 20 years of age and older:   A BMI below 18.5 is considered underweight.  A BMI of 18.5 to 24.9 is normal.  A BMI of 25 to 29.9 is considered overweight.  A BMI of 30 and above is considered obese.  Watch levels of cholesterol and blood lipids  You should start having your blood tested for lipids and cholesterol at 61 years of age, then have this test every 5 years.  You may need to have your cholesterol levels checked more often if:  Your lipid  or cholesterol levels are high.  You are older than 61 years of age.  You are at high risk for heart disease.  CANCER SCREENING   Lung Cancer  Lung cancer screening is recommended for adults 55-80 years old who are at high risk for lung cancer because of a history of smoking.  A yearly low-dose CT scan of the lungs is recommended for people who:  Currently smoke.  Have quit within the past 15 years.  Have at least a 30-pack-year history of smoking. A pack year is smoking an average of one pack of cigarettes a day for 1 year.  Yearly screening should continue until it has been 15 years since you quit.  Yearly screening should stop if you develop a health problem that would prevent you from having lung cancer treatment.  Breast Cancer  Practice breast self-awareness. This means understanding how your breasts normally appear and feel.  It also means doing regular breast self-exams. Let your health care provider know about any changes, no matter how small.  If you are in your 20s or 30s, you should have a clinical breast exam (CBE) by a health care provider every 1-3 years as part of a regular health exam.  If you are 40 or older, have a CBE every year. Also consider having a breast X-ray (mammogram) every year.  If you have a family history of breast cancer, talk to your health care provider about genetic screening.  If you   are at high risk for breast cancer, talk to your health care provider about having an MRI and a mammogram every year.  Breast cancer gene (BRCA) assessment is recommended for women who have family members with BRCA-related cancers. BRCA-related cancers include:  Breast.  Ovarian.  Tubal.  Peritoneal cancers.  Results of the assessment will determine the need for genetic counseling and BRCA1 and BRCA2 testing. Cervical Cancer Your health care provider may recommend that you be screened regularly for cancer of the pelvic organs (ovaries, uterus, and  vagina). This screening involves a pelvic examination, including checking for microscopic changes to the surface of your cervix (Pap test). You may be encouraged to have this screening done every 3 years, beginning at age 21.  For women ages 30-65, health care providers may recommend pelvic exams and Pap testing every 3 years, or they may recommend the Pap and pelvic exam, combined with testing for human papilloma virus (HPV), every 5 years. Some types of HPV increase your risk of cervical cancer. Testing for HPV may also be done on women of any age with unclear Pap test results.  Other health care providers may not recommend any screening for nonpregnant women who are considered low risk for pelvic cancer and who do not have symptoms. Ask your health care provider if a screening pelvic exam is right for you.  If you have had past treatment for cervical cancer or a condition that could lead to cancer, you need Pap tests and screening for cancer for at least 20 years after your treatment. If Pap tests have been discontinued, your risk factors (such as having a new sexual partner) need to be reassessed to determine if screening should resume. Some women have medical problems that increase the chance of getting cervical cancer. In these cases, your health care provider may recommend more frequent screening and Pap tests. Colorectal Cancer  This type of cancer can be detected and often prevented.  Routine colorectal cancer screening usually begins at 61 years of age and continues through 61 years of age.  Your health care provider may recommend screening at an earlier age if you have risk factors for colon cancer.  Your health care provider may also recommend using home test kits to check for hidden blood in the stool.  A small camera at the end of a tube can be used to examine your colon directly (sigmoidoscopy or colonoscopy). This is done to check for the earliest forms of colorectal  cancer.  Routine screening usually begins at age 50.  Direct examination of the colon should be repeated every 5-10 years through 61 years of age. However, you may need to be screened more often if early forms of precancerous polyps or small growths are found. Skin Cancer  Check your skin from head to toe regularly.  Tell your health care provider about any new moles or changes in moles, especially if there is a change in a mole's shape or color.  Also tell your health care provider if you have a mole that is larger than the size of a pencil eraser.  Always use sunscreen. Apply sunscreen liberally and repeatedly throughout the day.  Protect yourself by wearing long sleeves, pants, a wide-brimmed hat, and sunglasses whenever you are outside. HEART DISEASE, DIABETES, AND HIGH BLOOD PRESSURE   High blood pressure causes heart disease and increases the risk of stroke. High blood pressure is more likely to develop in:  People who have blood pressure in the high end   of the normal range (130-139/85-89 mm Hg).  People who are overweight or obese.  People who are African American.  If you are 67-49 years of age, have your blood pressure checked every 3-5 years. If you are 41 years of age or older, have your blood pressure checked every year. You should have your blood pressure measured twice--once when you are at a hospital or clinic, and once when you are not at a hospital or clinic. Record the average of the two measurements. To check your blood pressure when you are not at a hospital or clinic, you can use:  An automated blood pressure machine at a pharmacy.  A home blood pressure monitor.  If you are between 32 years and 14 years old, ask your health care provider if you should take aspirin to prevent strokes.  Have regular diabetes screenings. This involves taking a blood sample to check your fasting blood sugar level.  If you are at a normal weight and have a low risk for diabetes,  have this test once every three years after 61 years of age.  If you are overweight and have a high risk for diabetes, consider being tested at a younger age or more often. PREVENTING INFECTION  Hepatitis B  If you have a higher risk for hepatitis B, you should be screened for this virus. You are considered at high risk for hepatitis B if:  You were born in a country where hepatitis B is common. Ask your health care provider which countries are considered high risk.  Your parents were born in a high-risk country, and you have not been immunized against hepatitis B (hepatitis B vaccine).  You have HIV or AIDS.  You use needles to inject street drugs.  You live with someone who has hepatitis B.  You have had sex with someone who has hepatitis B.  You get hemodialysis treatment.  You take certain medicines for conditions, including cancer, organ transplantation, and autoimmune conditions. Hepatitis C  Blood testing is recommended for:  Everyone born from [birth year] through 1966.  Anyone with known risk factors for hepatitis C. Sexually transmitted infections (STIs)  You should be screened for sexually transmitted infections (STIs) including gonorrhea and chlamydia if:  You are sexually active and are younger than 61 years of age.  You are older than 61 years of age and your health care provider tells you that you are at risk for this type of infection.  Your sexual activity has changed since you were last screened and you are at an increased risk for chlamydia or gonorrhea. Ask your health care provider if you are at risk.  If you do not have HIV, but are at risk, it may be recommended that you take a prescription medicine daily to prevent HIV infection. This is called pre-exposure prophylaxis (PrEP). You are considered at risk if:  You are sexually active and do not regularly use condoms or know the HIV status of your partner(s).  You take drugs by injection.  You are sexually  active with a partner who has HIV. Talk with your health care provider about whether you are at high risk of being infected with HIV. If you choose to begin PrEP, you should first be tested for HIV. You should then be tested every 3 months for as long as you are taking PrEP.  PREGNANCY   If you are premenopausal and you may become pregnant, ask your health care provider about preconception counseling.  If you may  become pregnant, take 400 to 800 micrograms (mcg) of folic acid every day.  If you want to prevent pregnancy, talk to your health care provider about birth control (contraception). OSTEOPOROSIS AND MENOPAUSE   Osteoporosis is a disease in which the bones lose minerals and strength with aging. This can result in serious bone fractures. Your risk for osteoporosis can be identified using a bone density scan.  If you are 61 years of age or older, or if you are at risk for osteoporosis and fractures, ask your health care provider if you should be screened.  Ask your health care provider whether you should take a calcium or vitamin D supplement to lower your risk for osteoporosis.  Menopause may have certain physical symptoms and risks.  Hormone replacement therapy may reduce some of these symptoms and risks. Talk to your health care provider about whether hormone replacement therapy is right for you.  HOME CARE INSTRUCTIONS   Schedule regular health, dental, and eye exams.  Stay current with your immunizations.   Do not use any tobacco products including cigarettes, chewing tobacco, or electronic cigarettes.  If you are pregnant, do not drink alcohol.  If you are breastfeeding, limit how much and how often you drink alcohol.  Limit alcohol intake to no more than 1 drink per day for nonpregnant women. One drink equals 12 ounces of beer, 5 ounces of wine, or 1 ounces of hard liquor.  Do not use street drugs.  Do not share needles.  Ask your health care provider for help if  you need support or information about quitting drugs.  Tell your health care provider if you often feel depressed.  Tell your health care provider if you have ever been abused or do not feel safe at home.   This information is not intended to replace advice given to you by your health care provider. Make sure you discuss any questions you have with your health care provider.   Document Released: 09/23/2010 Document Revised: 03/31/2014 Document Reviewed: 02/09/2013 Elsevier Interactive Patient Education [employer].

## 2015-10-11 ENCOUNTER — Other Ambulatory Visit: Payer: Self-pay | Admitting: Obstetrics and Gynecology

## 2015-10-11 ENCOUNTER — Telehealth: Payer: Self-pay

## 2015-10-11 ENCOUNTER — Encounter: Payer: Self-pay | Admitting: Obstetrics and Gynecology

## 2015-10-11 DIAGNOSIS — Z803 Family history of malignant neoplasm of breast: Secondary | ICD-10-CM

## 2015-10-11 DIAGNOSIS — Z1231 Encounter for screening mammogram for malignant neoplasm of breast: Secondary | ICD-10-CM

## 2015-10-11 MED ORDER — NYSTATIN 100000 UNIT/GM EX POWD: Freq: Two times a day (BID) | CUTANEOUS | Status: DC

## 2015-10-11 NOTE — Telephone Encounter (Signed)
Spoke with patient's husband Diana Gross, okay per ROI. Advised rx for Nystatin powder has been sent to the patients pharmacy on file. He is agreeable and will notify the patient.  Routing to provider for final review. Patient agreeable to disposition. Will close encounter.

## 2015-10-11 NOTE — Telephone Encounter (Signed)
I have sent in the prescription for Nystatin powder with refills.

## 2015-10-11 NOTE — Telephone Encounter (Signed)
Please see telephone encounter dated with today's date regarding patient's mychart message.

## 2015-10-11 NOTE — Telephone Encounter (Signed)
Non-Urgent Medical Question  Message [phone]   From  [place] T [place]he   To  Nunzio Cobbs, MD   Sent  10/11/2015 10:11 AM     Dr. Quincy Simmonds, I forgot to ask if you could call in the Nystatin powder for me. [facility] in [place]. Thank you for your kindness at my appointment yesterday. You have the world's best bedside manner with nervous patients! Effie Shy.      Responsible Party    Pool - [facility] Clinical Pool No one has taken responsibility for this message.     No actions have been taken on this message.     Routing to [place] for review and advise.

## 2015-10-19 ENCOUNTER — Ambulatory Visit: Payer: Medicare Other | Admitting: Obstetrics and Gynecology

## 2015-10-24 ENCOUNTER — Telehealth: Payer: Self-pay | Admitting: Obstetrics and Gynecology

## 2015-10-24 NOTE — Telephone Encounter (Signed)
°  [facility] called to report this patient has an appointment  11/13/15 at 1:00pm with [facility].

## 2015-10-26 ENCOUNTER — Ambulatory Visit
Admission: RE | Admit: 2015-10-26 | Discharge: 2015-10-26 | Disposition: A | Discharge status: Home / Self Care | Payer: Medicare Other | Source: Ambulatory Visit | Attending: Obstetrics and Gynecology | Admitting: Obstetrics and Gynecology

## 2015-10-26 DIAGNOSIS — Z803 Family history of malignant neoplasm of breast: Secondary | ICD-10-CM

## 2015-10-26 DIAGNOSIS — Z1231 Encounter for screening mammogram for malignant neoplasm of breast: Secondary | ICD-10-CM

## 2015-11-05 DIAGNOSIS — G500 Trigeminal neuralgia: Secondary | ICD-10-CM | POA: Diagnosis not present

## 2015-11-05 DIAGNOSIS — G501 Atypical facial pain: Secondary | ICD-10-CM | POA: Diagnosis not present

## 2015-11-05 DIAGNOSIS — G894 Chronic pain syndrome: Secondary | ICD-10-CM | POA: Diagnosis not present

## 2015-11-06 DIAGNOSIS — M9908 Segmental and somatic dysfunction of rib cage: Secondary | ICD-10-CM | POA: Diagnosis not present

## 2015-11-06 DIAGNOSIS — M26629 Arthralgia of temporomandibular joint, unspecified side: Secondary | ICD-10-CM | POA: Diagnosis not present

## 2015-11-06 DIAGNOSIS — M9900 Segmental and somatic dysfunction of head region: Secondary | ICD-10-CM | POA: Diagnosis not present

## 2015-11-06 DIAGNOSIS — G500 Trigeminal neuralgia: Secondary | ICD-10-CM | POA: Diagnosis not present

## 2015-11-06 DIAGNOSIS — M9907 Segmental and somatic dysfunction of upper extremity: Secondary | ICD-10-CM | POA: Diagnosis not present

## 2015-11-06 DIAGNOSIS — M791 Myalgia: Secondary | ICD-10-CM | POA: Diagnosis not present

## 2015-11-06 DIAGNOSIS — R0781 Pleurodynia: Secondary | ICD-10-CM | POA: Diagnosis not present

## 2015-11-06 DIAGNOSIS — G5643 Causalgia of bilateral upper limbs: Secondary | ICD-10-CM | POA: Diagnosis not present

## 2015-11-06 DIAGNOSIS — M9901 Segmental and somatic dysfunction of cervical region: Secondary | ICD-10-CM | POA: Diagnosis not present

## 2015-11-16 DIAGNOSIS — F4310 Post-traumatic stress disorder, unspecified: Secondary | ICD-10-CM | POA: Diagnosis not present

## 2015-11-19 DIAGNOSIS — F4310 Post-traumatic stress disorder, unspecified: Secondary | ICD-10-CM | POA: Diagnosis not present

## 2015-11-28 DIAGNOSIS — F4310 Post-traumatic stress disorder, unspecified: Secondary | ICD-10-CM | POA: Diagnosis not present

## 2015-12-03 DIAGNOSIS — F4310 Post-traumatic stress disorder, unspecified: Secondary | ICD-10-CM | POA: Diagnosis not present

## 2015-12-05 ENCOUNTER — Ambulatory Visit (INDEPENDENT_AMBULATORY_CARE_PROVIDER_SITE_OTHER): Payer: Medicare Other | Admitting: Obstetrics and Gynecology

## 2015-12-05 ENCOUNTER — Encounter: Payer: Self-pay | Admitting: Obstetrics and Gynecology

## 2015-12-05 VITALS — BP 122/74 | HR 60 | Ht 68.0 in | Wt 215.9 lb

## 2015-12-05 DIAGNOSIS — Z79899 Other long term (current) drug therapy: Secondary | ICD-10-CM | POA: Diagnosis not present

## 2015-12-05 MED ORDER — ESTROGENS, CONJUGATED 0.625 MG/GM VA CREA
TOPICAL_CREAM | VAGINAL | 1 refills | Status: DC
Start: 2015-12-05 — End: 2016-11-13

## 2015-12-05 MED ORDER — ESTRADIOL 0.0375 MG/24HR TD PTTW: 1.0000 | MEDICATED_PATCH | TRANSDERMAL | 9 refills | Status: DC
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
## 2015-12-06 ENCOUNTER — Telehealth: Payer: Self-pay | Admitting: Obstetrics and Gynecology

## 2015-12-06 NOTE — Telephone Encounter (Signed)
Lovena Le, could you help perform this PA for this patient via cover my meds please?

## 2015-12-06 NOTE — Telephone Encounter (Signed)
Patient called and requested a prior authorization, per her pharmacy, on her "new patch." She is out of the current medication this patch will replace.

## 2015-12-07 NOTE — Telephone Encounter (Signed)
PA has been sent to plan. 

## 2015-12-07 NOTE — Telephone Encounter (Signed)
Diana Gross with [employer] calling regarding authorization for estradiol. Phone number is [phone] option 5.

## 2015-12-10 NOTE — Telephone Encounter (Signed)
Spoke with rep from [facility] 12/07/15

## 2015-12-11 DIAGNOSIS — F4310 Post-traumatic stress disorder, unspecified: Secondary | ICD-10-CM | POA: Diagnosis not present

## 2015-12-17 DIAGNOSIS — F4310 Post-traumatic stress disorder, unspecified: Secondary | ICD-10-CM | POA: Diagnosis not present

## 2015-12-18 ENCOUNTER — Other Ambulatory Visit: Payer: Self-pay | Admitting: Obstetrics & Gynecology

## 2015-12-18 ENCOUNTER — Telehealth: Payer: Self-pay | Admitting: Obstetrics and Gynecology

## 2015-12-18 MED ORDER — ESTRADIOL 0.05 MG/24HR TD PTWK: 0.0500 mg | MEDICATED_PATCH | TRANSDERMAL | 12 refills | Status: DC

## 2015-12-18 NOTE — Telephone Encounter (Signed)
Spoke with patient's husband Diana Gross, okay per ROI. Diana Gross states that the patient decreased the dosage of her Minivelle patch to .0375 mg twice weekly as discussed at her OV on 12/05/2015. Diana Gross states that since switching to lower dose patch the patient has been experiencing increased hot flashes, depression, difficulty sleeping, and increased pain all over her body. Patient has a history of chronic neuralgia. Patient is seen by pain management in [place] for chronic pain. Husband states that the patient has not expressed feelings of harming herself, but is emotional. Feels she is not sleeping well due to increased pain and change in hormone levels. Husband states that the patient is unable to function since she started using the [place] .0375 mg patches and that the patient would like to switch back to her previous Climara .05 mg patches. Advised I will speak with the Diana Gross and return call with further recommendations. Diana Gross is agreeable and verbalizes understanding.  Routing to [place] for review as Dr.Silva is out of the office today.  Cc: Dr.Silva

## 2015-12-18 NOTE — Telephone Encounter (Signed)
Patient's spouse wants to speak with a nurse regarding some problems his wife is having with the new patch and her pain level and mental status.

## 2015-12-18 NOTE — Telephone Encounter (Signed)
Ok to increase back to the 0.05mg  climara patches.  Ok to notify pt.  Will CC: Dr. Quincy Simmonds.

## 2015-12-18 NOTE — Telephone Encounter (Signed)
Spoke with patient's spouse Randall Hiss, okay per ROI. Advised of message as seen below from [place]. Advised patient will need to discontinue use of Minivelle. Randall Hiss is agreeable and will pick up patient Climara patches tonight so that the patient can restart this medication.   Cc: Dr.Silva  Routing to provider for final review. Patient agreeable to disposition. Will close encounter.

## 2015-12-24 DIAGNOSIS — G894 Chronic pain syndrome: Secondary | ICD-10-CM | POA: Diagnosis not present

## 2015-12-24 DIAGNOSIS — M5412 Radiculopathy, cervical region: Secondary | ICD-10-CM | POA: Diagnosis not present

## 2015-12-25 DIAGNOSIS — M542 Cervicalgia: Secondary | ICD-10-CM | POA: Diagnosis not present

## 2015-12-26 DIAGNOSIS — R351 Nocturia: Secondary | ICD-10-CM | POA: Diagnosis not present

## 2015-12-26 DIAGNOSIS — R3914 Feeling of incomplete bladder emptying: Secondary | ICD-10-CM | POA: Diagnosis not present

## 2015-12-26 DIAGNOSIS — R350 Frequency of micturition: Secondary | ICD-10-CM | POA: Diagnosis not present

## 2015-12-26 DIAGNOSIS — N816 Rectocele: Secondary | ICD-10-CM | POA: Diagnosis not present

## 2015-12-27 DIAGNOSIS — M542 Cervicalgia: Secondary | ICD-10-CM | POA: Diagnosis not present

## 2015-12-31 DIAGNOSIS — M542 Cervicalgia: Secondary | ICD-10-CM | POA: Diagnosis not present

## 2016-01-02 DIAGNOSIS — M542 Cervicalgia: Secondary | ICD-10-CM | POA: Diagnosis not present

## 2016-01-08 DIAGNOSIS — M542 Cervicalgia: Secondary | ICD-10-CM | POA: Diagnosis not present

## 2016-01-10 DIAGNOSIS — F4310 Post-traumatic stress disorder, unspecified: Secondary | ICD-10-CM | POA: Diagnosis not present

## 2016-01-10 DIAGNOSIS — M542 Cervicalgia: Secondary | ICD-10-CM | POA: Diagnosis not present

## 2016-01-11 DIAGNOSIS — L819 Disorder of pigmentation, unspecified: Secondary | ICD-10-CM | POA: Diagnosis not present

## 2016-01-11 DIAGNOSIS — Z85828 Personal history of other malignant neoplasm of skin: Secondary | ICD-10-CM | POA: Diagnosis not present

## 2016-01-11 DIAGNOSIS — C44719 Basal cell carcinoma of skin of left lower limb, including hip: Secondary | ICD-10-CM | POA: Diagnosis not present

## 2016-01-11 DIAGNOSIS — D3611 Benign neoplasm of peripheral nerves and autonomic nervous system of face, head, and neck: Secondary | ICD-10-CM | POA: Diagnosis not present

## 2016-01-28 DIAGNOSIS — F4310 Post-traumatic stress disorder, unspecified: Secondary | ICD-10-CM | POA: Diagnosis not present

## 2016-02-01 ENCOUNTER — Ambulatory Visit (INDEPENDENT_AMBULATORY_CARE_PROVIDER_SITE_OTHER): Payer: Medicare Other | Admitting: Family Medicine

## 2016-02-01 ENCOUNTER — Encounter: Payer: Self-pay | Admitting: Family Medicine

## 2016-02-01 VITALS — BP 137/84 | HR 64 | Temp 98.2°F | Resp 20 | Wt 219.0 lb

## 2016-02-01 DIAGNOSIS — J0100 Acute maxillary sinusitis, unspecified: Secondary | ICD-10-CM | POA: Diagnosis not present

## 2016-02-01 MED ORDER — DOXYCYCLINE HYCLATE 100 MG PO TABS: 100.0000 mg | ORAL_TABLET | Freq: Two times a day (BID) | ORAL | 0 refills | Status: DC

## 2016-02-01 NOTE — Progress Notes (Signed)
Diana Gross , [birth year], 60 y.o., female MRN: [id number] Patient Care Team    Relationship Specialty Notifications Start End  Ma Hillock, DO PCP - General Family Medicine  05/01/15   Vicki Mallet, DO  Osteopathic Medicine  05/17/15    Comment: receives OMT  Gerrit Heck, NP  Gynecology  05/17/15     CC: cough Subjective: Pt presents for an acute OV with complaints of cough of 2.5 weeks duration.  Associated symptoms include nausea. Pt has tried nyquil to ease their symptoms but felt it wsa interacting with her pain meds.  She reports she had been improving, but is now worsening again.  She is coughing, especially at night, Fatigue, PND, nasal congestion. She denies fever, chills, wheezing or shortness breath. She reports the cough is keeping her up at night.   Allergies  Allergen Reactions  . Amitriptyline Rash    Other Reaction: involuntary movements  . Baclofen Diarrhea and Other (See Comments)    Other Reaction: rigid muscle, cramping  . Bupropion Other (See Comments)    Other Reaction: insominia, severe agitation  . Clonazepam Itching  . Duloxetine Rash    Other Reaction: high blood sugar, skin rash  . Fluoxetine Anxiety    Other Reaction: insomnia, severe agitation  . Gabapentin Other (See Comments)    Other Reaction: throat closing up, hallucinates  . Oxcarbazepine Rash  . Paroxetine Hcl Anxiety    Other Reaction: insomnia, severe agitation  . Penicillins Nausea And Vomiting  . Tapentadol Rash  . Topiramate     Other reaction(s): Other (See Comments) Other Reaction: inconttinence/urine  . Tramadol Anxiety    Other Reaction: heart racing/ panic attacks  . Carbamazepine Rash  . Carisoprodol Nausea Only  . Ibuprofen Rash  . Metaxalone Other (See Comments)    Other Reaction: Other reaction  . Naproxen Sodium Rash  . Oxymorphone Rash  . Augmentin [Amoxicillin-Pot Clavulanate]   . Decadron  [Dexamethasone]     Other reaction(s): Unknown Uncoded  Allergy. Allergen: DECADRON  . Fentanyl Other (See Comments)    Suicidal ideation  . Pregabalin Swelling    Other Reaction: swelling of hands/feet  . Prozac [Fluoxetine Hcl] Other (See Comments)    insomnia  . Ativan [Lorazepam] Anxiety  . Celebrex [Celecoxib] Rash  . Cymbalta [Duloxetine Hcl] Rash    Elevated blood sugar  . Flexeril [Cyclobenzaprine] Rash and Itching  . Nortriptyline Rash  . Opana [Oxymorphone Hcl] Rash  . Valium [Diazepam] Rash  . Vimpat [Lacosamide] Rash   Social History  Substance Use Topics  . Smoking status: Never Smoker  . Smokeless tobacco: Never Used  . Alcohol use No   Past Medical History:  Diagnosis Date  . Abnormal uterine bleeding    prior to hysterectomy  . Allergy   . Anemia    prior to hysterectomy  . Anxiety   . Arthritis    right hip  . Basal cell carcinoma 2015   Right shin.   Marland Kitchen BCC (basal cell carcinoma of skin)   . Cervical pain   . Complex regional pain syndrome   . Depression   . Epidemic cervical myalgia   . Fibroid    reason for Hysterectomy  . Heart murmur    Grade I  . History of abnormal cervical Pap smear 1989   --hx colpo/cryotherapy to cervix   . 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 Hx of migraines   .  Neuromuscular disorder (HCC)    fibromyalgia, myofasical pain  . Occipital neuralgia   . Tic douloureux   . TMJ (dislocation of temporomandibular joint)   . Trigeminal neuralgia   . Urinary incontinence   . Vaginal prolapse 2016   cystocele and rectocele   Past Surgical History:  Procedure Laterality Date  . APPENDECTOMY  1972  . CHOLECYSTECTOMY  1995  . CRANIECTOMY  2010   due to Trigeminal neuralgia at [facility]  . URETHRAL DILATION  1974  . URETHRAL DIVERTICULUM REPAIR  1992  . VAGINAL HYSTERECTOMY  1997   Family History  Problem Relation Age of Onset  . Breast cancer Mother 60  . Diabetes Mellitus II Mother   . Diabetes Mother 19    Type 2  .  Hyperlipidemia Mother   . Aplastic anemia Brother 59    passed away age 6 with Leukemia  . Heart disease Father     Dec 72 CHF  . Alcoholism Father   . Hypertension Father   . Stroke Maternal Grandmother     dec age 29  . Alcoholism Sister   . Migraines Sister   . Testicular cancer Son 65  . Cancer Paternal Uncle 17    "Spinal"     Medication List       Accurate as of 02/01/16  1:38 PM. Always use your most recent med list.          ALPRAZolam 1 MG tablet Commonly known as:  XANAX Takes 1/2 tablet prn   AMBIEN 10 MG tablet Generic drug:  zolpidem Take by mouth.   b complex vitamins tablet Take 1 tablet by mouth daily.   conjugated estrogens vaginal cream Commonly known as:  PREMARIN Place 1/2 gram vaginally QHS 2 X per week   D3 SUPER STRENGTH 2000 units Caps Generic drug:  Cholecalciferol Take 2,000 Units by mouth daily.   diphenhydrAMINE 25 mg capsule Commonly known as:  BENADRYL Take 25 mg by mouth at bedtime.   estradiol 0.05 mg/24hr patch Commonly known as:  CLIMARA - Dosed in mg/24 hr Place 1 patch (0.05 mg total) onto the skin once a week.   loperamide 2 MG tablet Commonly known as:  IMODIUM A-D Dispense in original package and use according to package directions   magnesium 30 MG tablet Take 30 mg by mouth 2 (two) times daily.   nystatin powder Commonly known as:  NYAMYC Apply topically 2 (two) times daily. Use for one week as needed.   ondansetron 4 MG disintegrating tablet Commonly known as:  ZOFRAN-ODT Take 4 mg by mouth every 8 (eight) hours as needed for nausea or vomiting.   oxyCODONE 20 MG/ML concentrated solution Commonly known as:  ROXICODONE INTENSOL Take 0.5 mLs by mouth as needed.   oxyCODONE 15 MG immediate release tablet Commonly known as:  ROXICODONE Take 1 tablet by mouth every 4 (four) hours.   oxyCODONE 20 mg 12 hr tablet Commonly known as:  OXYCONTIN Take 2 tablets by mouth at bedtime.   SUMAtriptan 50 MG  tablet Commonly known as:  IMITREX   TART CHERRY ADVANCED PO Take by mouth.   ZANTAC 75 75 MG tablet Generic drug:  ranitidine Take by mouth as needed.       No results found for this or any previous visit (from the past 24 hour(s)). No results found.   ROS: Negative, with the exception of above mentioned in HPI   Objective:  BP 137/84 (BP Location: Left Arm, Patient Position:  Sitting, Cuff Size: Normal)   Pulse 64   Temp 98.2 F (36.8 C)   Resp 20   Wt 219 lb (99.3 kg)   SpO2 98%   BMI 33.30 kg/m  Body mass index is 33.3 kg/m. Gen: Afebrile. No acute distress. Nontoxic in appearance, well developed, well nourished.  HENT: AT. [place]. Bilateral TM visualized wnl. MMM, no oral lesions. Bilateral nares with mild erythema, no swelling. Throat without erythema or exudates. PND present, cough and hoarseness present. TTP max sinus.  Eyes:Pupils Equal Round Reactive to light, Extraocular movements intact,  Conjunctiva without redness, discharge or icterus. Neck/lymp/endocrine: Supple,no lymphadenopathy CV: RRR Chest: CTAB, no wheeze or crackles. Good air movement, normal resp effort.  Abd: Soft. NTND. BS present.  Skin: no rashes, purpura or petechiae.  Neuro:  Normal gait. PERLA. EOMi. Alert. Oriented x3  Assessment/Plan: MATTILYN HUENINK is a 60 y.o. female present for acute OV for  Acute maxillary sinusitis, recurrence not specified mucinex DM Can continue zofran Rest and fluids.  Doxycycline BID x10 d F/u PRN  electronically signed by:  Howard Pouch, DO  [place]

## 2016-02-01 NOTE — Patient Instructions (Signed)
Mucinex DM Continue zofran Rest and fluids.  Doxycycline every 12 hours for 10 days.    Try a humidifier as well with heater season.    Sinusitis, Adult Sinusitis is redness, soreness, and inflammation of the paranasal sinuses. Paranasal sinuses are air pockets within the bones of your face. They are located beneath your eyes, in the middle of your forehead, and above your eyes. In healthy paranasal sinuses, mucus is able to drain out, and air is able to circulate through them by way of your nose. However, when your paranasal sinuses are inflamed, mucus and air can become trapped. This can allow bacteria and other germs to grow and cause infection. Sinusitis can develop quickly and last only a short time (acute) or continue over a long period (chronic). Sinusitis that lasts for more than 12 weeks is considered chronic. CAUSES Causes of sinusitis include:  Allergies.  Structural abnormalities, such as displacement of the cartilage that separates your nostrils (deviated septum), which can decrease the air flow through your nose and sinuses and affect sinus drainage.  Functional abnormalities, such as when the small hairs (cilia) that line your sinuses and help remove mucus do not work properly or are not present. SIGNS AND SYMPTOMS Symptoms of acute and chronic sinusitis are the same. The primary symptoms are pain and pressure around the affected sinuses. Other symptoms include:  Upper toothache.  Earache.  Headache.  Bad breath.  Decreased sense of smell and taste.  A cough, which worsens when you are lying flat.  Fatigue.  Fever.  Thick drainage from your nose, which often is green and may contain pus (purulent).  Swelling and warmth over the affected sinuses. DIAGNOSIS Your health care provider will perform a physical exam. During your exam, your health care provider may perform any of the following to help determine if you have acute sinusitis or chronic  sinusitis:  Look in your nose for signs of abnormal growths in your nostrils (nasal polyps).  Tap over the affected sinus to check for signs of infection.  View the inside of your sinuses using an imaging device that has a light attached (endoscope). If your health care provider suspects that you have chronic sinusitis, one or more of the following tests may be recommended:  Allergy tests.  Nasal culture. A sample of mucus is taken from your nose, sent to a lab, and screened for bacteria.  Nasal cytology. A sample of mucus is taken from your nose and examined by your health care provider to determine if your sinusitis is related to an allergy. TREATMENT Most cases of acute sinusitis are related to a viral infection and will resolve on their own within 10 days. Sometimes, medicines are prescribed to help relieve symptoms of both acute and chronic sinusitis. These may include pain medicines, decongestants, nasal steroid sprays, or saline sprays. However, for sinusitis related to a bacterial infection, your health care provider will prescribe antibiotic medicines. These are medicines that will help kill the bacteria causing the infection. Rarely, sinusitis is caused by a fungal infection. In these cases, your health care provider will prescribe antifungal medicine. For some cases of chronic sinusitis, surgery is needed. Generally, these are cases in which sinusitis recurs more than 3 times per year, despite other treatments. HOME CARE INSTRUCTIONS  Drink plenty of water. Water helps thin the mucus so your sinuses can drain more easily.  Use a humidifier.  Inhale steam 3-4 times a day (for example, sit in the bathroom with the shower  running).  Apply a warm, moist washcloth to your face 3-4 times a day, or as directed by your health care provider.  Use saline nasal sprays to help moisten and clean your sinuses.  Take medicines only as directed by your health care provider.  If you were  prescribed either an antibiotic or antifungal medicine, finish it all even if you start to feel better. SEEK IMMEDIATE MEDICAL CARE IF:  You have increasing pain or severe headaches.  You have nausea, vomiting, or drowsiness.  You have swelling around your face.  You have vision problems.  You have a stiff neck.  You have difficulty breathing.   This information is not intended to replace advice given to you by your health care provider. Make sure you discuss any questions you have with your health care provider.   Document Released: 03/10/2005 Document Revised: 03/31/2014 Document Reviewed: 03/25/2011 Elsevier Interactive Patient Education [employer].

## 2016-02-04 DIAGNOSIS — M542 Cervicalgia: Secondary | ICD-10-CM | POA: Diagnosis not present

## 2016-02-11 DIAGNOSIS — F4310 Post-traumatic stress disorder, unspecified: Secondary | ICD-10-CM | POA: Diagnosis not present

## 2016-02-18 DIAGNOSIS — G244 Idiopathic orofacial dystonia: Secondary | ICD-10-CM | POA: Diagnosis not present

## 2016-02-18 DIAGNOSIS — G894 Chronic pain syndrome: Secondary | ICD-10-CM | POA: Diagnosis not present

## 2016-02-18 DIAGNOSIS — M5481 Occipital neuralgia: Secondary | ICD-10-CM | POA: Diagnosis not present

## 2016-02-18 DIAGNOSIS — G501 Atypical facial pain: Secondary | ICD-10-CM | POA: Diagnosis not present

## 2016-02-28 DIAGNOSIS — F4310 Post-traumatic stress disorder, unspecified: Secondary | ICD-10-CM | POA: Diagnosis not present

## 2016-03-04 DIAGNOSIS — M9901 Segmental and somatic dysfunction of cervical region: Secondary | ICD-10-CM | POA: Diagnosis not present

## 2016-03-04 DIAGNOSIS — G8929 Other chronic pain: Secondary | ICD-10-CM | POA: Diagnosis not present

## 2016-03-04 DIAGNOSIS — M9905 Segmental and somatic dysfunction of pelvic region: Secondary | ICD-10-CM | POA: Diagnosis not present

## 2016-03-04 DIAGNOSIS — M9907 Segmental and somatic dysfunction of upper extremity: Secondary | ICD-10-CM | POA: Diagnosis not present

## 2016-03-04 DIAGNOSIS — R0781 Pleurodynia: Secondary | ICD-10-CM | POA: Diagnosis not present

## 2016-03-04 DIAGNOSIS — M9903 Segmental and somatic dysfunction of lumbar region: Secondary | ICD-10-CM | POA: Diagnosis not present

## 2016-03-04 DIAGNOSIS — M9900 Segmental and somatic dysfunction of head region: Secondary | ICD-10-CM | POA: Diagnosis not present

## 2016-03-04 DIAGNOSIS — M25551 Pain in right hip: Secondary | ICD-10-CM | POA: Diagnosis not present

## 2016-03-04 DIAGNOSIS — G500 Trigeminal neuralgia: Secondary | ICD-10-CM | POA: Diagnosis not present

## 2016-03-04 DIAGNOSIS — M791 Myalgia: Secondary | ICD-10-CM | POA: Diagnosis not present

## 2016-03-04 DIAGNOSIS — M545 Low back pain: Secondary | ICD-10-CM | POA: Diagnosis not present

## 2016-03-04 DIAGNOSIS — Z23 Encounter for immunization: Secondary | ICD-10-CM | POA: Diagnosis not present

## 2016-03-04 DIAGNOSIS — M25511 Pain in right shoulder: Secondary | ICD-10-CM | POA: Diagnosis not present

## 2016-03-04 DIAGNOSIS — M9908 Segmental and somatic dysfunction of rib cage: Secondary | ICD-10-CM | POA: Diagnosis not present

## 2016-03-04 DIAGNOSIS — M26629 Arthralgia of temporomandibular joint, unspecified side: Secondary | ICD-10-CM | POA: Diagnosis not present

## 2016-03-19 ENCOUNTER — Other Ambulatory Visit: Payer: Self-pay | Admitting: Pain Medicine

## 2016-03-19 DIAGNOSIS — R51 Headache: Secondary | ICD-10-CM | POA: Diagnosis not present

## 2016-03-19 DIAGNOSIS — M26609 Unspecified temporomandibular joint disorder, unspecified side: Secondary | ICD-10-CM

## 2016-03-19 DIAGNOSIS — G500 Trigeminal neuralgia: Secondary | ICD-10-CM

## 2016-03-19 DIAGNOSIS — R519 Headache, unspecified: Secondary | ICD-10-CM

## 2016-03-19 DIAGNOSIS — M5481 Occipital neuralgia: Secondary | ICD-10-CM

## 2016-03-19 DIAGNOSIS — G43909 Migraine, unspecified, not intractable, without status migrainosus: Secondary | ICD-10-CM | POA: Diagnosis not present

## 2016-03-27 ENCOUNTER — Ambulatory Visit
Admission: RE | Admit: 2016-03-27 | Discharge: 2016-03-27 | Disposition: A | Discharge status: Home / Self Care | Payer: Medicare Other | Source: Ambulatory Visit | Attending: Pain Medicine | Admitting: Pain Medicine

## 2016-03-27 DIAGNOSIS — M26609 Unspecified temporomandibular joint disorder, unspecified side: Secondary | ICD-10-CM

## 2016-03-27 DIAGNOSIS — R51 Headache: Secondary | ICD-10-CM

## 2016-03-27 DIAGNOSIS — G500 Trigeminal neuralgia: Secondary | ICD-10-CM | POA: Diagnosis not present

## 2016-03-27 DIAGNOSIS — M5481 Occipital neuralgia: Secondary | ICD-10-CM | POA: Diagnosis not present

## 2016-03-27 DIAGNOSIS — M47812 Spondylosis without myelopathy or radiculopathy, cervical region: Secondary | ICD-10-CM | POA: Diagnosis not present

## 2016-03-27 DIAGNOSIS — R519 Headache, unspecified: Secondary | ICD-10-CM

## 2016-03-27 MED ORDER — IOPAMIDOL (ISOVUE-300) INJECTION 61%
75.0000 mL | Freq: Once | INTRAVENOUS | Status: AC | PRN
  Administered 2016-03-27: 75 mL via INTRAVENOUS

## 2016-04-01 DIAGNOSIS — M26629 Arthralgia of temporomandibular joint, unspecified side: Secondary | ICD-10-CM | POA: Diagnosis not present

## 2016-04-01 DIAGNOSIS — G500 Trigeminal neuralgia: Secondary | ICD-10-CM | POA: Diagnosis not present

## 2016-04-01 DIAGNOSIS — M9907 Segmental and somatic dysfunction of upper extremity: Secondary | ICD-10-CM | POA: Diagnosis not present

## 2016-04-01 DIAGNOSIS — M9900 Segmental and somatic dysfunction of head region: Secondary | ICD-10-CM | POA: Diagnosis not present

## 2016-04-01 DIAGNOSIS — R0781 Pleurodynia: Secondary | ICD-10-CM | POA: Diagnosis not present

## 2016-04-01 DIAGNOSIS — G5643 Causalgia of bilateral upper limbs: Secondary | ICD-10-CM | POA: Diagnosis not present

## 2016-04-01 DIAGNOSIS — M791 Myalgia: Secondary | ICD-10-CM | POA: Diagnosis not present

## 2016-04-01 DIAGNOSIS — M9908 Segmental and somatic dysfunction of rib cage: Secondary | ICD-10-CM | POA: Diagnosis not present

## 2016-04-01 DIAGNOSIS — M9901 Segmental and somatic dysfunction of cervical region: Secondary | ICD-10-CM | POA: Diagnosis not present

## 2016-04-07 DIAGNOSIS — M5481 Occipital neuralgia: Secondary | ICD-10-CM | POA: Diagnosis not present

## 2016-04-07 DIAGNOSIS — M5412 Radiculopathy, cervical region: Secondary | ICD-10-CM | POA: Diagnosis not present

## 2016-04-07 DIAGNOSIS — G500 Trigeminal neuralgia: Secondary | ICD-10-CM | POA: Diagnosis not present

## 2016-04-22 DIAGNOSIS — M542 Cervicalgia: Secondary | ICD-10-CM | POA: Diagnosis not present

## 2016-04-29 DIAGNOSIS — M542 Cervicalgia: Secondary | ICD-10-CM | POA: Diagnosis not present

## 2016-05-06 DIAGNOSIS — M9900 Segmental and somatic dysfunction of head region: Secondary | ICD-10-CM | POA: Diagnosis not present

## 2016-05-06 DIAGNOSIS — G500 Trigeminal neuralgia: Secondary | ICD-10-CM | POA: Diagnosis not present

## 2016-05-06 DIAGNOSIS — M26629 Arthralgia of temporomandibular joint, unspecified side: Secondary | ICD-10-CM | POA: Diagnosis not present

## 2016-05-06 DIAGNOSIS — M9901 Segmental and somatic dysfunction of cervical region: Secondary | ICD-10-CM | POA: Diagnosis not present

## 2016-05-08 DIAGNOSIS — M542 Cervicalgia: Secondary | ICD-10-CM | POA: Diagnosis not present

## 2016-05-19 DIAGNOSIS — C541 Malignant neoplasm of endometrium: Secondary | ICD-10-CM | POA: Diagnosis not present

## 2016-05-19 DIAGNOSIS — G245 Blepharospasm: Secondary | ICD-10-CM | POA: Diagnosis not present

## 2016-05-19 DIAGNOSIS — G43019 Migraine without aura, intractable, without status migrainosus: Secondary | ICD-10-CM | POA: Diagnosis not present

## 2016-05-19 DIAGNOSIS — Z79899 Other long term (current) drug therapy: Secondary | ICD-10-CM | POA: Diagnosis not present

## 2016-06-03 DIAGNOSIS — M9900 Segmental and somatic dysfunction of head region: Secondary | ICD-10-CM | POA: Diagnosis not present

## 2016-06-03 DIAGNOSIS — G500 Trigeminal neuralgia: Secondary | ICD-10-CM | POA: Diagnosis not present

## 2016-06-03 DIAGNOSIS — M26629 Arthralgia of temporomandibular joint, unspecified side: Secondary | ICD-10-CM | POA: Diagnosis not present

## 2016-06-03 DIAGNOSIS — M9901 Segmental and somatic dysfunction of cervical region: Secondary | ICD-10-CM | POA: Diagnosis not present

## 2016-06-09 DIAGNOSIS — Z79891 Long term (current) use of opiate analgesic: Secondary | ICD-10-CM | POA: Diagnosis not present

## 2016-06-09 DIAGNOSIS — G894 Chronic pain syndrome: Secondary | ICD-10-CM | POA: Diagnosis not present

## 2016-06-09 DIAGNOSIS — G43909 Migraine, unspecified, not intractable, without status migrainosus: Secondary | ICD-10-CM | POA: Diagnosis not present

## 2016-06-09 DIAGNOSIS — R51 Headache: Secondary | ICD-10-CM | POA: Diagnosis not present

## 2016-06-09 DIAGNOSIS — G500 Trigeminal neuralgia: Secondary | ICD-10-CM | POA: Diagnosis not present

## 2016-07-03 DIAGNOSIS — F4310 Post-traumatic stress disorder, unspecified: Secondary | ICD-10-CM | POA: Diagnosis not present

## 2016-07-04 DIAGNOSIS — G501 Atypical facial pain: Secondary | ICD-10-CM | POA: Diagnosis not present

## 2016-07-04 DIAGNOSIS — M5401 Panniculitis affecting regions of neck and back, occipito-atlanto-axial region: Secondary | ICD-10-CM | POA: Diagnosis not present

## 2016-07-04 DIAGNOSIS — G43019 Migraine without aura, intractable, without status migrainosus: Secondary | ICD-10-CM | POA: Diagnosis not present

## 2016-07-04 DIAGNOSIS — M7600 Gluteal tendinitis, unspecified hip: Secondary | ICD-10-CM | POA: Diagnosis not present

## 2016-07-08 DIAGNOSIS — M26629 Arthralgia of temporomandibular joint, unspecified side: Secondary | ICD-10-CM | POA: Diagnosis not present

## 2016-07-08 DIAGNOSIS — G500 Trigeminal neuralgia: Secondary | ICD-10-CM | POA: Diagnosis not present

## 2016-07-08 DIAGNOSIS — M9900 Segmental and somatic dysfunction of head region: Secondary | ICD-10-CM | POA: Diagnosis not present

## 2016-07-08 DIAGNOSIS — M9901 Segmental and somatic dysfunction of cervical region: Secondary | ICD-10-CM | POA: Diagnosis not present

## 2016-08-05 DIAGNOSIS — M9908 Segmental and somatic dysfunction of rib cage: Secondary | ICD-10-CM | POA: Diagnosis not present

## 2016-08-05 DIAGNOSIS — G500 Trigeminal neuralgia: Secondary | ICD-10-CM | POA: Diagnosis not present

## 2016-08-05 DIAGNOSIS — M9907 Segmental and somatic dysfunction of upper extremity: Secondary | ICD-10-CM | POA: Diagnosis not present

## 2016-08-05 DIAGNOSIS — M9900 Segmental and somatic dysfunction of head region: Secondary | ICD-10-CM | POA: Diagnosis not present

## 2016-08-05 DIAGNOSIS — M26629 Arthralgia of temporomandibular joint, unspecified side: Secondary | ICD-10-CM | POA: Diagnosis not present

## 2016-08-05 DIAGNOSIS — M9901 Segmental and somatic dysfunction of cervical region: Secondary | ICD-10-CM | POA: Diagnosis not present

## 2016-08-05 DIAGNOSIS — G5643 Causalgia of bilateral upper limbs: Secondary | ICD-10-CM | POA: Diagnosis not present

## 2016-08-05 DIAGNOSIS — M791 Myalgia: Secondary | ICD-10-CM | POA: Diagnosis not present

## 2016-08-12 DIAGNOSIS — G43019 Migraine without aura, intractable, without status migrainosus: Secondary | ICD-10-CM | POA: Diagnosis not present

## 2016-08-12 DIAGNOSIS — Z79899 Other long term (current) drug therapy: Secondary | ICD-10-CM | POA: Diagnosis not present

## 2016-08-12 DIAGNOSIS — G500 Trigeminal neuralgia: Secondary | ICD-10-CM | POA: Diagnosis not present

## 2016-08-14 DIAGNOSIS — F4310 Post-traumatic stress disorder, unspecified: Secondary | ICD-10-CM | POA: Diagnosis not present

## 2016-09-02 DIAGNOSIS — M791 Myalgia: Secondary | ICD-10-CM | POA: Diagnosis not present

## 2016-09-02 DIAGNOSIS — M9900 Segmental and somatic dysfunction of head region: Secondary | ICD-10-CM | POA: Diagnosis not present

## 2016-09-02 DIAGNOSIS — M25511 Pain in right shoulder: Secondary | ICD-10-CM | POA: Diagnosis not present

## 2016-09-02 DIAGNOSIS — M26629 Arthralgia of temporomandibular joint, unspecified side: Secondary | ICD-10-CM | POA: Diagnosis not present

## 2016-09-02 DIAGNOSIS — R0781 Pleurodynia: Secondary | ICD-10-CM | POA: Diagnosis not present

## 2016-09-02 DIAGNOSIS — M9907 Segmental and somatic dysfunction of upper extremity: Secondary | ICD-10-CM | POA: Diagnosis not present

## 2016-09-02 DIAGNOSIS — M9901 Segmental and somatic dysfunction of cervical region: Secondary | ICD-10-CM | POA: Diagnosis not present

## 2016-09-02 DIAGNOSIS — M9908 Segmental and somatic dysfunction of rib cage: Secondary | ICD-10-CM | POA: Diagnosis not present

## 2016-09-11 ENCOUNTER — Other Ambulatory Visit: Payer: Self-pay | Admitting: Obstetrics and Gynecology

## 2016-09-11 DIAGNOSIS — Z1231 Encounter for screening mammogram for malignant neoplasm of breast: Secondary | ICD-10-CM

## 2016-09-22 DIAGNOSIS — G500 Trigeminal neuralgia: Secondary | ICD-10-CM | POA: Diagnosis not present

## 2016-09-22 DIAGNOSIS — G43909 Migraine, unspecified, not intractable, without status migrainosus: Secondary | ICD-10-CM | POA: Diagnosis not present

## 2016-09-22 DIAGNOSIS — G243 Spasmodic torticollis: Secondary | ICD-10-CM | POA: Diagnosis not present

## 2016-09-22 DIAGNOSIS — M5481 Occipital neuralgia: Secondary | ICD-10-CM | POA: Diagnosis not present

## 2016-10-10 DIAGNOSIS — M9901 Segmental and somatic dysfunction of cervical region: Secondary | ICD-10-CM | POA: Diagnosis not present

## 2016-10-10 DIAGNOSIS — G500 Trigeminal neuralgia: Secondary | ICD-10-CM | POA: Diagnosis not present

## 2016-10-10 DIAGNOSIS — M791 Myalgia: Secondary | ICD-10-CM | POA: Diagnosis not present

## 2016-10-10 DIAGNOSIS — M9900 Segmental and somatic dysfunction of head region: Secondary | ICD-10-CM | POA: Diagnosis not present

## 2016-10-21 DIAGNOSIS — M9900 Segmental and somatic dysfunction of head region: Secondary | ICD-10-CM | POA: Diagnosis not present

## 2016-10-21 DIAGNOSIS — G500 Trigeminal neuralgia: Secondary | ICD-10-CM | POA: Diagnosis not present

## 2016-10-21 DIAGNOSIS — M26629 Arthralgia of temporomandibular joint, unspecified side: Secondary | ICD-10-CM | POA: Diagnosis not present

## 2016-10-21 DIAGNOSIS — M9901 Segmental and somatic dysfunction of cervical region: Secondary | ICD-10-CM | POA: Diagnosis not present

## 2016-10-27 ENCOUNTER — Ambulatory Visit
Admission: RE | Admit: 2016-10-27 | Discharge: 2016-10-27 | Disposition: A | Discharge status: Home / Self Care | Payer: Medicare Other | Source: Ambulatory Visit | Attending: Obstetrics and Gynecology | Admitting: Obstetrics and Gynecology

## 2016-10-27 DIAGNOSIS — Z1231 Encounter for screening mammogram for malignant neoplasm of breast: Secondary | ICD-10-CM

## 2016-11-03 DIAGNOSIS — M791 Myalgia: Secondary | ICD-10-CM | POA: Diagnosis not present

## 2016-11-03 DIAGNOSIS — M2624 Reverse articulation: Secondary | ICD-10-CM | POA: Diagnosis not present

## 2016-11-03 DIAGNOSIS — G500 Trigeminal neuralgia: Secondary | ICD-10-CM | POA: Diagnosis not present

## 2016-11-03 DIAGNOSIS — M26623 Arthralgia of bilateral temporomandibular joint: Secondary | ICD-10-CM | POA: Diagnosis not present

## 2016-11-06 ENCOUNTER — Telehealth: Payer: Self-pay | Admitting: Obstetrics and Gynecology

## 2016-11-06 NOTE — Telephone Encounter (Signed)
Left patient a message to call back to reschedule a future appointment that was cancelled by the provider for an AEX. Please route call to [facility] for rescheduling.

## 2016-11-11 DIAGNOSIS — R0781 Pleurodynia: Secondary | ICD-10-CM | POA: Diagnosis not present

## 2016-11-11 DIAGNOSIS — M9901 Segmental and somatic dysfunction of cervical region: Secondary | ICD-10-CM | POA: Diagnosis not present

## 2016-11-11 DIAGNOSIS — M9908 Segmental and somatic dysfunction of rib cage: Secondary | ICD-10-CM | POA: Diagnosis not present

## 2016-11-11 DIAGNOSIS — M9900 Segmental and somatic dysfunction of head region: Secondary | ICD-10-CM | POA: Diagnosis not present

## 2016-11-11 DIAGNOSIS — M26629 Arthralgia of temporomandibular joint, unspecified side: Secondary | ICD-10-CM | POA: Diagnosis not present

## 2016-11-11 DIAGNOSIS — G500 Trigeminal neuralgia: Secondary | ICD-10-CM | POA: Diagnosis not present

## 2016-11-11 DIAGNOSIS — M9907 Segmental and somatic dysfunction of upper extremity: Secondary | ICD-10-CM | POA: Diagnosis not present

## 2016-11-11 DIAGNOSIS — M791 Myalgia: Secondary | ICD-10-CM | POA: Diagnosis not present

## 2016-11-12 NOTE — Progress Notes (Signed)
61 y.o. G1P1001 Married Caucasian female here for annual exam.    Seeing a new provider, physiologic dentist, at [facility] for her TMJ. Planning a new MRI.   Wants to get off narcotics.  Struggles with nausea.  Sees pain management in [place].   ERT patient.  She tried to go down on her dosage on her own and she did not feel good.   Straining to void and double voiding.  This is inconvenient for her. Saw Dr. Matilde Sprang who recommended against surgery.  He states that her splinting is more habit.   Using vaginal estrogen cream twice a week.  Has some concerns about breast cancer risk and weight gain.   PCP:   Kerry Hough, D.O.  No LMP recorded. Patient has had a hysterectomy.           Sexually active: Yes.   female The current method of family planning is status post hysterectomy.    Exercising: Yes.    female Smoker:  no  Health Maintenance: Pap: 2015 normal per patient History of abnormal Pap:  Yes, Hx cryotherapy to cervix 1989; paps normal since. MMG: 10-27-16 Density C/Neg/BiRads1:TBC Colonoscopy: Age 79 to 1 normal in [place]. BMD: 2010  Result Normal in [place]:  2010 Gardasil:   no HIV: 2010 Neg Hep C: unsure Screening Labs:  Hb today: PCP, Urine today: not done   reports that she has never smoked. She has never used smokeless tobacco. She reports that she does not drink alcohol or use drugs.  Past Medical History:  Diagnosis Date  . Abnormal uterine bleeding    prior to hysterectomy  . Allergy   . Anemia    prior to hysterectomy  . Anxiety   . Arthritis    right hip  . Basal cell carcinoma 2015   Right shin.   Marland Kitchen BCC (basal cell carcinoma of skin)   . Cervical pain   . Complex regional pain syndrome   . Depression   . Epidemic cervical myalgia   . Fibroid    reason for Hysterectomy  . Heart murmur    Grade I  . History of abnormal cervical Pap smear 1989   --hx colpo/cryotherapy to cervix   . History of cranial surgery    with "complications"  which has caused chronic pain   . Hx of migraines   . Neuromuscular disorder (HCC)    fibromyalgia, myofasical pain  . Occipital neuralgia   . Tic douloureux   . TMJ (dislocation of temporomandibular joint)   . Trigeminal neuralgia   . Urinary incontinence   . Vaginal prolapse 2016   cystocele and rectocele    Past Surgical History:  Procedure Laterality Date  . APPENDECTOMY  1972  . CHOLECYSTECTOMY  1995  . CRANIECTOMY  2010   due to Trigeminal neuralgia at [facility]  . URETHRAL DILATION  1974  . URETHRAL DIVERTICULUM REPAIR  1992  . VAGINAL HYSTERECTOMY  1997    Current Outpatient Prescriptions  Medication Sig Dispense Refill  . ALPRAZolam (XANAX) 1 MG tablet Takes 1/2 to 1 tablet up to 4 times a day  0  . b complex vitamins tablet Take 1 tablet by mouth daily.    . Cholecalciferol (D3 SUPER STRENGTH) 2000 units CAPS Take 2,000 Units by mouth daily.    Marland Kitchen conjugated estrogens (PREMARIN) vaginal cream Place 1/2 gram vaginally QHS 2 X per week 42.5 g 1  . estradiol (CLIMARA - DOSED IN MG/24 HR) 0.05 mg/24hr patch Place 1  patch (0.05 mg total) onto the skin once a week. 4 patch 12  . loperamide (IMODIUM A-D) 2 MG tablet Dispense in original package and use according to package directions    . magnesium 30 MG tablet Take 30 mg by mouth 2 (two) times daily.    . Misc Natural Products (TART CHERRY ADVANCED PO) Take by mouth.    . nystatin [facility]) powder Apply topically 2 (two) times daily. Use for one week as needed. 15 g 2  . ondansetron (ZOFRAN-ODT) 4 MG disintegrating tablet Take 4 mg by mouth every 8 (eight) hours as needed for nausea or vomiting.    Marland Kitchen oxyCODONE (OXYCONTIN) 20 mg 12 hr tablet Take 2 tablets by mouth at bedtime. Patient takes 3 tablets at bedtime  0  . oxyCODONE (ROXICODONE INTENSOL) 20 MG/ML concentrated solution Take 0.5 mLs by mouth as needed.  0  . oxyCODONE (ROXICODONE) 15 MG immediate release tablet Take 1 tablet by mouth every 4 (four) hours.  0  . ranitidine  (ZANTAC 75) 75 MG tablet Take by mouth as needed.     . ranitidine (ZANTAC 75) 75 MG tablet Take 1 tablet by mouth daily.    . SUMAtriptan (IMITREX) 50 MG tablet   0  . zolpidem (AMBIEN) 10 MG tablet Take by mouth.     No current facility-administered medications for this visit.     Family History  Problem Relation Age of Onset  . Breast cancer Mother 5  . Diabetes Mellitus II Mother   . Diabetes Mother 80       Type 2  . Hyperlipidemia Mother   . Aplastic anemia Brother 58       passed away age 36 with Leukemia  . Heart disease Father        Dec 72 CHF  . Alcoholism Father   . Hypertension Father   . Stroke Maternal Grandmother        dec age 64  . Alcoholism Sister   . Migraines Sister   . Testicular cancer Son 57  . Cancer Paternal Uncle 47       "Spinal"    ROS:  Pertinent items are noted in HPI.  Otherwise, a comprehensive ROS was negative.  Exam:   BP 134/70 (BP Location: Right Arm, Patient Position: Sitting, Cuff Size: Large)   Pulse 66   Resp 14   Ht 5\' 8"  (1.727 m)   Wt 214 lb 6.4 oz (97.3 kg)   BMI 32.60 kg/m     General appearance: alert, cooperative and appears stated age Head: Normocephalic, without obvious abnormality, atraumatic Neck: no adenopathy, supple, symmetrical, trachea midline and thyroid normal to inspection and palpation Lungs: clear to auscultation bilaterally Breasts: normal appearance, no masses or tenderness, No nipple retraction or dimpling, No nipple discharge or bleeding, No axillary or supraclavicular adenopathy Heart: regular rate and rhythm Abdomen: soft, non-tender; no masses, no organomegaly Extremities: extremities normal, atraumatic, no cyanosis or edema Skin: Skin color, texture, turgor normal. No rashes or lesions Lymph nodes: Cervical, supraclavicular, and axillary nodes normal. No abnormal inguinal nodes palpated Neurologic: Grossly normal  Pelvic: External genitalia:  no lesions              Urethra:  normal appearing  urethra with no masses, tenderness or lesions              Bartholins and Skenes: normal                 Vagina: normal  appearing vagina with normal color and discharge, no lesions.  Second degree high rectocele with possible small enterocele.              Cervix:absent.               Pap taken: No. Bimanual Exam:  Uterus:   Absent.              Adnexa: no mass, fullness, tenderness              Rectal exam: Yes.  .  Confirms.              Anus:  normal sphincter tone, no lesions  Chaperone was present for exam.  Assessment:   Well woman visit with normal exam. Fh of breast CA, testicular CA, and leukemia. The following is taken from my annual exam visit with patient on 10/09/16: Status post hysterectomy/BSO. ERT patient.  FH of breast cancer in mother.  Status post urethral diverticulum repair.  Status post urethral dilation. Minimal cystocele and third degree rectocele.  Genuine stress incontinence at max bladder capacity with urodynamic testing.  Voiding dysfunction. Possible retention. Narcotic use may be affecting voiding. Urodynamic testing unable to assess voiding as patient did forceful manipulation to void at the end of the urodynamic study.  Chronic pain and use of narcotics. Complex medical history.   Plan: Mammogram screening discussed. Recommended self breast awareness. Pap and HR HPV as above. Guidelines for Calcium, Vitamin D, regular exercise program including cardiovascular and weight bearing exercise. Continue ERT (and Premarin vaginal cream).  Discussed increased risks of DVT, PE, stroke, and possible breast cancer. Will refer for genetic counseling and testing.  Follow up annually and prn.   After visit summary provided.

## 2016-11-13 ENCOUNTER — Ambulatory Visit (INDEPENDENT_AMBULATORY_CARE_PROVIDER_SITE_OTHER): Payer: Medicare Other | Admitting: Obstetrics and Gynecology

## 2016-11-13 ENCOUNTER — Ambulatory Visit: Payer: Medicare Other | Admitting: Obstetrics and Gynecology

## 2016-11-13 VITALS — BP 134/70 | HR 66 | Resp 14 | Ht 68.0 in | Wt 214.4 lb

## 2016-11-13 DIAGNOSIS — Z124 Encounter for screening for malignant neoplasm of cervix: Secondary | ICD-10-CM | POA: Diagnosis not present

## 2016-11-13 DIAGNOSIS — Z803 Family history of malignant neoplasm of breast: Secondary | ICD-10-CM

## 2016-11-13 DIAGNOSIS — Z01419 Encounter for gynecological examination (general) (routine) without abnormal findings: Secondary | ICD-10-CM

## 2016-11-13 MED ORDER — NYSTATIN 100000 UNIT/GM EX POWD: Freq: Two times a day (BID) | CUTANEOUS | 2 refills | Status: DC

## 2016-11-13 MED ORDER — ESTROGENS, CONJUGATED 0.625 MG/GM VA CREA: TOPICAL_CREAM | VAGINAL | 1 refills | Status: DC

## 2016-11-13 MED ORDER — ESTRADIOL 0.05 MG/24HR TD PTWK: 0.0500 mg | MEDICATED_PATCH | TRANSDERMAL | 12 refills | Status: DC

## 2016-11-13 NOTE — Patient Instructions (Signed)

## 2016-11-15 ENCOUNTER — Encounter: Payer: Self-pay | Admitting: Obstetrics and Gynecology

## 2016-11-22 DIAGNOSIS — M1990 Unspecified osteoarthritis, unspecified site: Secondary | ICD-10-CM | POA: Diagnosis not present

## 2016-11-22 DIAGNOSIS — M26629 Arthralgia of temporomandibular joint, unspecified side: Secondary | ICD-10-CM | POA: Diagnosis not present

## 2016-11-25 DIAGNOSIS — G501 Atypical facial pain: Secondary | ICD-10-CM | POA: Diagnosis not present

## 2016-11-25 DIAGNOSIS — G894 Chronic pain syndrome: Secondary | ICD-10-CM | POA: Diagnosis not present

## 2016-11-25 DIAGNOSIS — Z79899 Other long term (current) drug therapy: Secondary | ICD-10-CM | POA: Diagnosis not present

## 2016-11-25 DIAGNOSIS — G500 Trigeminal neuralgia: Secondary | ICD-10-CM | POA: Diagnosis not present

## 2016-12-09 DIAGNOSIS — G500 Trigeminal neuralgia: Secondary | ICD-10-CM | POA: Diagnosis not present

## 2016-12-09 DIAGNOSIS — M9901 Segmental and somatic dysfunction of cervical region: Secondary | ICD-10-CM | POA: Diagnosis not present

## 2016-12-09 DIAGNOSIS — M9900 Segmental and somatic dysfunction of head region: Secondary | ICD-10-CM | POA: Diagnosis not present

## 2016-12-09 DIAGNOSIS — M26629 Arthralgia of temporomandibular joint, unspecified side: Secondary | ICD-10-CM | POA: Diagnosis not present

## 2016-12-15 ENCOUNTER — Ambulatory Visit (INDEPENDENT_AMBULATORY_CARE_PROVIDER_SITE_OTHER): Payer: Medicare Other | Admitting: Family Medicine

## 2016-12-15 ENCOUNTER — Encounter: Payer: Self-pay | Admitting: Family Medicine

## 2016-12-15 VITALS — BP 116/82 | HR 71 | Temp 98.2°F | Resp 20 | Wt 210.5 lb

## 2016-12-15 DIAGNOSIS — L659 Nonscarring hair loss, unspecified: Secondary | ICD-10-CM | POA: Insufficient documentation

## 2016-12-15 DIAGNOSIS — L989 Disorder of the skin and subcutaneous tissue, unspecified: Secondary | ICD-10-CM

## 2016-12-15 DIAGNOSIS — Z23 Encounter for immunization: Secondary | ICD-10-CM

## 2016-12-15 LAB — TSH: TSH: 0.58 u[IU]/mL (ref 0.35–4.50)

## 2016-12-15 LAB — VITAMIN D 25 HYDROXY (VIT D DEFICIENCY, FRACTURES): VITD: 54.25 ng/mL (ref 30.00–100.00)

## 2016-12-15 NOTE — Progress Notes (Signed)
Diana Gross , [birth year], 61 y.o., female MRN: [id number] Patient Care Team    Relationship Specialty Notifications Start End  Patrice Paradise, MD PCP - General Pain Medicine  12/15/16    Comment: pain management  Vicki Mallet, DO  Osteopathic Medicine  05/17/15    Comment: receives OMT  Theimer, Court Joy, NP  Gynecology  05/17/15     Chief Complaint  Patient presents with  . Alopecia    x 3 months  . place on back    Dermotology referral?     Subjective:  Hair loss:  Pt states her hair dresser feels she is losing hair over the last 5 months. She has noticed it mostly in the front of her scalp, especially when it is pulled back in a pony tail. She has stopped wearing ponytails and stopped using conditioner. She states she's always had a very thick head of hair, and now she feels like it is thinner. She reports blow drying her hair and brushing her hair, she loses a handful every day. She has started a multivitamin. She denies changes in bowel habits, she is on narcotics, therefore struggles with constipation. She denies any changes with dry skin, she has noticed mild nail breakage. She denies any physical/emotional stress.  Skin lesion:  She has a lesion on her upper back that is itchy and has been present for 4-5 months. She has a personal h/o BCC.  Depression screen PHQ 2/9 07/02/2015  Decreased Interest 0  Down, Depressed, Hopeless 0  PHQ - 2 Score 0    Allergies  Allergen Reactions  . Amitriptyline Rash    Other Reaction: involuntary movements  . Baclofen Diarrhea and Other (See Comments)    Other Reaction: rigid muscle, cramping  . Bupropion Other (See Comments)    Other Reaction: insominia, severe agitation  . Clonazepam Itching  . Duloxetine Rash    Other Reaction: high blood sugar, skin rash  . Fluoxetine Anxiety    Other Reaction: insomnia, severe agitation  . Gabapentin Other (See Comments)    Other Reaction: throat closing up, hallucinates  .  Oxcarbazepine Rash  . Paroxetine Hcl Anxiety    Other Reaction: insomnia, severe agitation  . Penicillins Nausea And Vomiting  . Tapentadol Rash  . Topiramate     Other reaction(s): Other (See Comments) Other Reaction: inconttinence/urine  . Tramadol Anxiety    Other Reaction: heart racing/ panic attacks  . Carbamazepine Rash  . Carisoprodol Nausea Only  . Ibuprofen Rash  . Metaxalone Other (See Comments)    Other Reaction: Other reaction  . Naproxen Sodium Rash  . Oxymorphone Rash  . Augmentin [Amoxicillin-Pot Clavulanate]   . Decadron  [Dexamethasone]     Other reaction(s): Unknown Uncoded Allergy. Allergen: DECADRON  . Fentanyl Other (See Comments)    Suicidal ideation  . Pregabalin Swelling    Other Reaction: swelling of hands/feet  . Prozac [Fluoxetine Hcl] Other (See Comments)    insomnia  . Ativan [Lorazepam] Anxiety  . Celebrex [Celecoxib] Rash  . Cymbalta [Duloxetine Hcl] Rash    Elevated blood sugar  . Flexeril [Cyclobenzaprine] Rash and Itching  . Nortriptyline Rash  . Opana [Oxymorphone Hcl] Rash  . Valium [Diazepam] Rash  . Vimpat [Lacosamide] Rash  . Zorvolex [Diclofenac] Anxiety   Social History  Substance Use Topics  . Smoking status: Never Smoker  . Smokeless tobacco: Never Used  . Alcohol use No   Past Medical History:  Diagnosis Date  . Abnormal  uterine bleeding    prior to hysterectomy  . Allergy   . Anemia    prior to hysterectomy  . Anxiety   . Arthritis    right hip  . Basal cell carcinoma 2015   Right shin.   Marland Kitchen BCC (basal cell carcinoma of skin)   . Cervical pain   . Complex regional pain syndrome   . Depression   . Epidemic cervical myalgia   . Fibroid    reason for Hysterectomy  . Heart murmur    Grade I  . History of abnormal cervical Pap smear 1989   --hx colpo/cryotherapy to cervix   . 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 Hx of migraines   . Neuromuscular disorder (HCC)      fibromyalgia, myofasical pain  . Occipital neuralgia   . Tic douloureux   . TMJ (dislocation of temporomandibular joint)   . Trigeminal neuralgia   . Urinary incontinence   . Vaginal prolapse 2016   cystocele and rectocele   Past Surgical History:  Procedure Laterality Date  . APPENDECTOMY  1972  . CHOLECYSTECTOMY  1995  . CRANIECTOMY  2010   due to Trigeminal neuralgia at [facility]  . URETHRAL DILATION  1974  . URETHRAL DIVERTICULUM REPAIR  1992  . VAGINAL HYSTERECTOMY  1997   Family History  Problem Relation Age of Onset  . Breast cancer Mother 36  . Diabetes Mellitus II Mother   . Diabetes Mother 27       Type 2  . Hyperlipidemia Mother   . Aplastic anemia Brother 45       passed away age 6 with Leukemia  . Heart disease Father        Dec 72 CHF  . Alcoholism Father   . Hypertension Father   . Stroke Maternal Grandmother        dec age 63  . Alcoholism Sister   . Migraines Sister   . Testicular cancer Son 21  . Cancer Paternal Uncle 61       "Spinal"   Allergies as of 12/15/2016      Reactions   Amitriptyline Rash   Other Reaction: involuntary movements   Baclofen Diarrhea, Other (See Comments)   Other Reaction: rigid muscle, cramping   Bupropion Other (See Comments)   Other Reaction: insominia, severe agitation   Clonazepam Itching   Duloxetine Rash   Other Reaction: high blood sugar, skin rash   Fluoxetine Anxiety   Other Reaction: insomnia, severe agitation   Gabapentin Other (See Comments)   Other Reaction: throat closing up, hallucinates   Oxcarbazepine Rash   Paroxetine Hcl Anxiety   Other Reaction: insomnia, severe agitation   Penicillins Nausea And Vomiting   Tapentadol Rash   Topiramate    Other reaction(s): Other (See Comments) Other Reaction: inconttinence/urine   Tramadol Anxiety   Other Reaction: heart racing/ panic attacks   Carbamazepine Rash   Carisoprodol Nausea Only   Ibuprofen Rash   Metaxalone Other (See Comments)   Other  Reaction: Other reaction   Naproxen Sodium Rash   Oxymorphone Rash   Augmentin [amoxicillin-pot Clavulanate]    Decadron  [dexamethasone]    Other reaction(s): Unknown Uncoded Allergy. Allergen: DECADRON   Fentanyl Other (See Comments)   Suicidal ideation   Pregabalin Swelling   Other Reaction: swelling of hands/feet   Prozac [fluoxetine Hcl] Other (See Comments)   insomnia   Ativan [lorazepam] Anxiety  Celebrex [celecoxib] Rash   Cymbalta [duloxetine Hcl] Rash   Elevated blood sugar   Flexeril [cyclobenzaprine] Rash, Itching   Nortriptyline Rash   Opana [oxymorphone Hcl] Rash   Valium [diazepam] Rash   Vimpat [lacosamide] Rash   Zorvolex [diclofenac] Anxiety      Medication List       Accurate as of 12/15/16  4:52 PM. Always use your most recent med list.          ALPRAZolam 1 MG tablet Commonly known as:  XANAX Takes 1/2 to 1 tablet up to 4 times a day   AMBIEN 10 MG tablet Generic drug:  zolpidem Take by mouth.   b complex vitamins tablet Take 1 tablet by mouth daily.   conjugated estrogens vaginal cream Commonly known as:  PREMARIN Place 1/2 gram vaginally QHS 2 X per week   D3 SUPER STRENGTH 2000 units Caps Generic drug:  Cholecalciferol Take 2,000 Units by mouth daily.   estradiol 0.05 mg/24hr patch Commonly known as:  CLIMARA - Dosed in mg/24 hr Place 1 patch (0.05 mg total) onto the skin once a week.   loperamide 2 MG tablet Commonly known as:  IMODIUM A-D Dispense in original package and use according to package directions   magnesium 30 MG tablet Take 125 mg by mouth 2 (two) times daily.   nystatin powder Commonly known as:  NYAMYC Apply topically 2 (two) times daily. Use for one week as needed.   ondansetron 4 MG disintegrating tablet Commonly known as:  ZOFRAN-ODT Take 4 mg by mouth every 8 (eight) hours as needed for nausea or vomiting.   oxyCODONE 20 MG/ML concentrated solution Commonly known as:  ROXICODONE INTENSOL Take 0.5 mLs  by mouth as needed.   oxyCODONE 15 MG immediate release tablet Commonly known as:  ROXICODONE Take 1 tablet by mouth every 4 (four) hours.   OXYCONTIN 15 mg 12 hr tablet Generic drug:  oxyCODONE Take 45 mg by mouth at bedtime.   SUMAtriptan 50 MG tablet Commonly known as:  IMITREX   TART CHERRY ADVANCED PO Take by mouth.   ZANTAC 75 75 MG tablet Generic drug:  ranitidine Take by mouth as needed.   ZANTAC 75 75 MG tablet Generic drug:  ranitidine Take 1 tablet by mouth daily.            Discharge Care Instructions        Start     Ordered   12/15/16 0000  TSH     12/15/16 1345   12/15/16 0000  Vitamin D (25 hydroxy)     12/15/16 1345   12/15/16 0000  Ambulatory referral to Dermatology    Comments:  Skin lesion back, concerning for [facility] (prior history). Hair loss   12/15/16 1352   12/15/16 0000  Fe+TIBC+Fer     12/15/16 1353   12/15/16 0000  Flu Vaccine QUAD 36+ mos IM     12/15/16 1407      All past medical history, surgical history, allergies, family history, immunizations andmedications were updated in the EMR today and reviewed under the history and medication portions of their EMR.     ROS: Negative, with the exception of above mentioned in HPI   Objective:  BP 116/82 (BP Location: Left Arm, Patient Position: Sitting, Cuff Size: Large)   Pulse 71   Temp 98.2 F (36.8 C)   Resp 20   Wt 210 lb 8 oz (95.5 kg)   SpO2 98%   BMI 32.01 kg/m  Body mass index is 32.01 kg/m. Gen: Afebrile. No acute distress. Nontoxic in appearance, well developed, well nourished.  HENT: AT. [place].MMM, no oral lesions. Negative hair pull test. Mild to moderate thinning bilateral temporal areas, and centrally near front of part.  Eyes:Pupils Equal Round Reactive to light, Extraocular movements intact,  Conjunctiva without redness, discharge or icterus. Neck/lymp/endocrine: Supple,no lymphadenopathy, No thyromegaly. Skin: ~3.5 mm round mildly raised pearly appearing lesions  left scapula.  Neuro: Normal gait. PERLA. EOMi. Alert. Oriented x3   No exam data present No results found. No results found for this or any previous visit (from the past 24 hour(s)).  Assessment/Plan: ALZENA GERBER is a 61 y.o. female present for OV for  Hair loss New No recent physical stress or surgery. Hair pull test negative. Thinning bilateral temporal and frontal areas. Discussed causes such as stress, vitamin deficiencies and iron deficiency as potential common causes for hair loss, with consideration to autoimmune disorder vs age/genetics. - TSH - Vitamin D (25 hydroxy) - Ambulatory referral to Dermatology - Fe+TIBC+Fer - Rule out potential causes with thyroid palpable, vitamin D and iron testing today, if all levels were normal, she has been referred to dermatology for skin lesion and hair loss.  Skin lesion New Possible BCC, refer to Derm given prior history.  - Ambulatory referral to Dermatology  Influenza vaccination provided today.   Reviewed expectations re: course of current medical issues.  Discussed self-management of symptoms.  Outlined signs and symptoms indicating need for more acute intervention.  Patient verbalized understanding and all questions were answered.  Patient received an After-Visit Summary.   Orders Placed This Encounter  Procedures  . Flu Vaccine QUAD 36+ mos IM  . TSH  . Vitamin D (25 hydroxy)  . Fe+TIBC+Fer  . Ambulatory referral to Dermatology   Note is dictated utilizing voice recognition software. Although note has been proof read prior to signing, occasional typographical errors still can be missed. If any questions arise, please do not hesitate to call for verification.   electronically signed by:  Howard Pouch, DO  [place]

## 2016-12-15 NOTE — Patient Instructions (Signed)
I will refer you to a new dermatologist.   We will collect labs today to look for reasons for hair loss. Vit D, thyroid and iron.     Alopecia Areata, Adult Alopecia areata is a condition that causes you to lose hair. You may lose hair on your scalp in patches. In some cases, you may lose all the hair on your scalp (alopecia totalis) or all the hair from your face and body (alopecia universalis). Alopecia areata is an autoimmune disease. This means that your body's defense system (immune system) mistakes normal parts of the body for germs or other things that can make you sick. When you have alopecia areata, the immune system attacks the hair follicles. Alopecia areata usually develops in childhood, but it can develop at any age. For some people, their hair grows back on its own and hair loss does not happen again. For others, their hair may fall out and grow back in cycles. The hair loss may last many years. Having this condition can be emotionally difficult, but it is not dangerous. What are the causes? The cause of this condition is not known. What increases the risk? This condition is more likely to develop in people who have:  A family history of alopecia.  A family history of another autoimmune disease, including type 1 diabetes and rheumatoid arthritis.  Asthma and allergies.  Down syndrome.  What are the signs or symptoms? Round spots of patchy hair loss on the scalp is the main symptom of this condition. The spots may be mildly itchy. Other symptoms include:  Short dark hairs in the bald patches that are wider at the top (exclamation point hairs).  Dents, white spots, or lines in the fingernails or toenails.  Balding and body hair loss. This is rare.  How is this diagnosed? This condition is diagnosed based on your symptoms and family history. Your health care provider will also check your scalp skin, teeth, and nails. Your health care provider may refer you to a specialist  in hair and skin disorders (dermatologist). You may also have tests, including:  A hair pull test.  Blood tests or other screening tests to check for autoimmune diseases, such as thyroid disease or diabetes.  Skin biopsy to confirm the diagnosis.  A procedure to examine the skin with a lighted magnifying instrument (dermoscopy).  How is this treated? There is no cure for alopecia areata. Treatment is aimed at promoting the regrowth of hair and preventing the immune system from overreacting. No single treatment is right for all people with alopecia areata. It depends on the type of hair loss you have and how severe it is. Work with your health care provider to find the best treatment for you. Treatment may include:  Having regular checkups to make sure the condition is not getting worse (watchful waiting).  Steroid creams or pills for 6-8 weeks to stop the immune reaction and help hair to regrow more quickly.  Other topical medicines to alter the immune system response and support the hair growth cycle.  Steroid injections.  Therapy and counseling with a support group or therapist if you are having trouble coping with hair loss.  Follow these instructions at home:  Learn as much as you can about your condition.  Apply topical creams only as told by your health care provider.  Take over-the-counter and prescription medicines only as told by your health care provider.  Consider getting a wig or products to make hair look fuller or  to cover bald spots, if you feel uncomfortable with your appearance.  Get therapy or counseling if you are having a hard time coping with hair loss. Ask your health care provider to recommend a counselor or support group.  Keep all follow-up visits as told by your health care provider. This is important. Contact a health care provider if:  Your hair loss gets worse, even with treatment.  You have new symptoms.  You are struggling  emotionally. Summary  Alopecia areata is an autoimmune condition that makes your body's defense system (immune system) attack the hair follicles. This causes you to lose hair.  Treatments may include regular checkups to make sure that the condition is not getting worse (watchful waiting), medicines, and steroid injections. This information is not intended to replace advice given to you by your health care provider. Make sure you discuss any questions you have with your health care provider. Document Released: 10/13/2003 Document Revised: 03/28/2016 Document Reviewed: 03/28/2016 Elsevier Interactive Patient Education  2018 [employer].

## 2016-12-16 ENCOUNTER — Encounter: Payer: Self-pay | Admitting: *Deleted

## 2016-12-16 ENCOUNTER — Encounter: Payer: Self-pay | Admitting: Family Medicine

## 2016-12-16 ENCOUNTER — Telehealth: Payer: Self-pay | Admitting: Family Medicine

## 2016-12-16 LAB — IRON,TIBC AND FERRITIN PANEL
%SAT: 32 % (ref 11–50)
FERRITIN: 71 ng/mL (ref 20–288)
Iron: 96 ug/dL (ref 45–160)
TIBC: 298 mcg/dL (calc) (ref 250–450)

## 2016-12-16 NOTE — Telephone Encounter (Signed)
Message sent in MY Chart 

## 2016-12-16 NOTE — Telephone Encounter (Signed)
Please call patient in reference to her E-chart message:  First of all, She should not feel guilty for being upset over her hair loss. This is a difficult situation for anyone, especially women.   Thyroid supplementation without abnormal TSH is not recommended by evidence based medicine in humans. Over supplementing a thyroid can cause heart arrhythmias which could have severe consequences. This is just one of the many potential side effects to over supplementing thyroid. I have referred her to dermatology to initiate evaluation and gather their opinion. They would be better suited to suggest or refer to a  dermatologist that specializes in hair loss.

## 2016-12-17 DIAGNOSIS — G500 Trigeminal neuralgia: Secondary | ICD-10-CM | POA: Diagnosis not present

## 2016-12-17 DIAGNOSIS — M26623 Arthralgia of bilateral temporomandibular joint: Secondary | ICD-10-CM | POA: Diagnosis not present

## 2016-12-17 DIAGNOSIS — M791 Myalgia: Secondary | ICD-10-CM | POA: Diagnosis not present

## 2016-12-17 DIAGNOSIS — M2624 Reverse articulation: Secondary | ICD-10-CM | POA: Diagnosis not present

## 2016-12-24 DIAGNOSIS — L650 Telogen effluvium: Secondary | ICD-10-CM | POA: Diagnosis not present

## 2016-12-24 DIAGNOSIS — L308 Other specified dermatitis: Secondary | ICD-10-CM | POA: Diagnosis not present

## 2016-12-24 DIAGNOSIS — D485 Neoplasm of uncertain behavior of skin: Secondary | ICD-10-CM | POA: Diagnosis not present

## 2016-12-24 DIAGNOSIS — Z85828 Personal history of other malignant neoplasm of skin: Secondary | ICD-10-CM | POA: Diagnosis not present

## 2016-12-29 ENCOUNTER — Encounter: Payer: Self-pay | Admitting: Genetic Counselor

## 2016-12-29 ENCOUNTER — Telehealth: Payer: Self-pay | Admitting: Genetic Counselor

## 2016-12-29 NOTE — Telephone Encounter (Signed)
Genetic counseling appointment has been scheduled with the patient's husband to see Ofri on 10/18 at 1:15pm. Pt's husband is aware to arrive 30 minutes early. Address and insurance verified. Letter mailed.

## 2017-01-06 DIAGNOSIS — M546 Pain in thoracic spine: Secondary | ICD-10-CM | POA: Diagnosis not present

## 2017-01-06 DIAGNOSIS — M9907 Segmental and somatic dysfunction of upper extremity: Secondary | ICD-10-CM | POA: Diagnosis not present

## 2017-01-06 DIAGNOSIS — G5643 Causalgia of bilateral upper limbs: Secondary | ICD-10-CM | POA: Diagnosis not present

## 2017-01-06 DIAGNOSIS — M9902 Segmental and somatic dysfunction of thoracic region: Secondary | ICD-10-CM | POA: Diagnosis not present

## 2017-01-08 ENCOUNTER — Encounter: Payer: Self-pay | Admitting: Genetic Counselor

## 2017-01-08 ENCOUNTER — Ambulatory Visit (HOSPITAL_BASED_OUTPATIENT_CLINIC_OR_DEPARTMENT_OTHER): Payer: Medicare Other | Admitting: Genetic Counselor

## 2017-01-08 ENCOUNTER — Other Ambulatory Visit: Payer: Medicare Other

## 2017-01-08 DIAGNOSIS — Z85828 Personal history of other malignant neoplasm of skin: Secondary | ICD-10-CM | POA: Diagnosis not present

## 2017-01-08 DIAGNOSIS — Z806 Family history of leukemia: Secondary | ICD-10-CM

## 2017-01-08 DIAGNOSIS — Z803 Family history of malignant neoplasm of breast: Secondary | ICD-10-CM | POA: Diagnosis not present

## 2017-01-08 DIAGNOSIS — Z808 Family history of malignant neoplasm of other organs or systems: Secondary | ICD-10-CM | POA: Diagnosis not present

## 2017-01-08 DIAGNOSIS — Z1379 Encounter for other screening for genetic and chromosomal anomalies: Secondary | ICD-10-CM

## 2017-01-08 DIAGNOSIS — Z315 Encounter for genetic counseling: Secondary | ICD-10-CM | POA: Diagnosis not present

## 2017-01-08 HISTORY — DX: Encounter for other screening for genetic and chromosomal anomalies: Z13.79
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
## 2017-01-20 ENCOUNTER — Encounter: Payer: Self-pay | Admitting: Genetic Counselor

## 2017-01-20 ENCOUNTER — Ambulatory Visit: Payer: Self-pay | Admitting: Genetic Counselor

## 2017-01-20 DIAGNOSIS — Z1379 Encounter for other screening for genetic and chromosomal anomalies: Secondary | ICD-10-CM

## 2017-01-20 NOTE — Progress Notes (Signed)
[facility]       Genetic Test Results    Patient Name: Diana Gross Patient DOB: 04-Aug-1955 Patient Age: 61 y.o. Encounter Date: 01/20/2017  Referring Provider: Nunzio Cobbs, MD   Primary Care Provider: Ma Hillock, DO   Ms. Brunell was called today to discuss genetic test results. Please see the Genetics note from her visit on 01/08/17 for a detailed discussion of her personal and family history.  Genetic Testing: At the time of Ms. Manna's visit, she decided to pursue genetic testing of multiple genes associated with hereditary susceptibility to cancer. Testing included sequencing and deletion/duplication analysis. Testing did not reveal any pathogenic mutation in any of these genes.  A copy of the genetic test report will be scanned into Epic under the media tab.  The genes analyzed were the 47 genes on [facility]'s Common Cancers panel (APC, ATM, AXIN2, BARD1, BMPR1A, BRCA1, BRCA2, BRIP1, CDH1, CDK4, CDKN2A, CHEK2, CTNNA1, DICER1, EPCAM, GREM1, HOXB13, KIT, MEN1, MLH1, MSH2, MSH3, MSH6, MUTYH, NBN, NF1, NTHL1, PALB2, PDGFRA, PMS2, POLD1, POLE, PTEN, RAD50, RAD51C, RAD51D, SDHA, SDHB, SDHC, SDHD, SMAD4, SMARCA4, STK11, TP53, TSC1, TSC2, VHL).  Since the current test is not perfect, it is possible that there may be a gene mutation that current testing cannot detect, but that chance is small. It is possible that a different genetic factor, which has not yet been discovered or is not on this panel, is responsible for the cancer diagnoses in the family. Again, the likelihood of this is low. Lastly, there may be a detectable mutation in the family that she did not inherit. No additional testing is recommended at this time for Ms. Calia.  Cancer Screening: These results are reassuring and indicate that Ms. Constantin does not likely have an increased risk of cancer due to a mutation in one of these genes.  We discussed undergoing cancer screenings for individuals in the general population.  The [employer]  recommends that women follow the breast screening recommendations below, but that these may need to be modified based on other risk factors such as dense breasts, biopsy history or family history.  Breast awareness - Women should be familiar with their breasts and promptly report changes to their healthcare provider.   Starting at age 33: Breast exam, risk assessment, and risk reduction counseling by the provider and mammogram every year. The provider may discuss screening with tomosynthesis.  Ms. Schrodt is also recommended to undergo a yearly gynecologic exam and speak with her GI doctor as to when to have her next colonoscopy.  Family Members:  Family members are also at some increased risk of developing cancer, over the general population risk, simply due to the family history. Women are recommended to have a yearly mammogram beginning at age 56, a yearly clinical breast exam, a yearly gynecologic exam and perform monthly breast self-exams. Colon cancer screening is recommended to begin by age 57 in both men and women, unless there is a family history of colon cancer or colon polyps or an individual has a personal history to warrant initiating screening at a younger age.  Any relative who had cancer at a young age or had a particularly rare cancer may also wish to pursue genetic testing. Genetic counselors can be located in other cities, by visiting the website of the [employer] ([employer]se) and Field seismologist for a Dietitian  by zip code.    Lastly, cancer genetics is a rapidly advancing field and it is possible that new genetic tests will be appropriate for Ms. Sumler in the future. We encourage her to remain in contact with [place] on an annual basis so we can update her personal and family histories, and let her know of  advances in cancer genetics that may benefit the family. Our contact number was provided. Ms. Wigglesworth is welcome to call anytime with additional questions.     Steele Berg, MS, [place] Certified Genetic Counselor phone: [phone]

## 2017-01-28 DIAGNOSIS — M9901 Segmental and somatic dysfunction of cervical region: Secondary | ICD-10-CM | POA: Diagnosis not present

## 2017-01-28 DIAGNOSIS — M9900 Segmental and somatic dysfunction of head region: Secondary | ICD-10-CM | POA: Diagnosis not present

## 2017-01-28 DIAGNOSIS — G500 Trigeminal neuralgia: Secondary | ICD-10-CM | POA: Diagnosis not present

## 2017-01-28 DIAGNOSIS — M26629 Arthralgia of temporomandibular joint, unspecified side: Secondary | ICD-10-CM | POA: Diagnosis not present

## 2017-02-03 DIAGNOSIS — G500 Trigeminal neuralgia: Secondary | ICD-10-CM | POA: Diagnosis not present

## 2017-02-03 DIAGNOSIS — M5481 Occipital neuralgia: Secondary | ICD-10-CM | POA: Diagnosis not present

## 2017-02-03 DIAGNOSIS — G894 Chronic pain syndrome: Secondary | ICD-10-CM | POA: Diagnosis not present

## 2017-02-05 DIAGNOSIS — M47812 Spondylosis without myelopathy or radiculopathy, cervical region: Secondary | ICD-10-CM | POA: Diagnosis not present

## 2017-02-05 DIAGNOSIS — M26622 Arthralgia of left temporomandibular joint: Secondary | ICD-10-CM | POA: Diagnosis not present

## 2017-02-05 DIAGNOSIS — G894 Chronic pain syndrome: Secondary | ICD-10-CM | POA: Diagnosis not present

## 2017-02-05 DIAGNOSIS — M50323 Other cervical disc degeneration at C6-C7 level: Secondary | ICD-10-CM | POA: Diagnosis not present

## 2017-02-05 DIAGNOSIS — M461 Sacroiliitis, not elsewhere classified: Secondary | ICD-10-CM | POA: Diagnosis not present

## 2017-02-25 ENCOUNTER — Other Ambulatory Visit: Payer: Self-pay | Admitting: Family

## 2017-02-25 DIAGNOSIS — R51 Headache: Secondary | ICD-10-CM

## 2017-02-25 DIAGNOSIS — R519 Headache, unspecified: Secondary | ICD-10-CM

## 2017-02-25 DIAGNOSIS — M25551 Pain in right hip: Secondary | ICD-10-CM | POA: Diagnosis not present

## 2017-02-25 DIAGNOSIS — M50323 Other cervical disc degeneration at C6-C7 level: Secondary | ICD-10-CM

## 2017-02-25 DIAGNOSIS — M47812 Spondylosis without myelopathy or radiculopathy, cervical region: Secondary | ICD-10-CM | POA: Diagnosis not present

## 2017-03-03 DIAGNOSIS — G500 Trigeminal neuralgia: Secondary | ICD-10-CM | POA: Diagnosis not present

## 2017-03-03 DIAGNOSIS — M7918 Myalgia, other site: Secondary | ICD-10-CM | POA: Diagnosis not present

## 2017-03-03 DIAGNOSIS — M9900 Segmental and somatic dysfunction of head region: Secondary | ICD-10-CM | POA: Diagnosis not present

## 2017-03-03 DIAGNOSIS — M9901 Segmental and somatic dysfunction of cervical region: Secondary | ICD-10-CM | POA: Diagnosis not present

## 2017-03-04 ENCOUNTER — Ambulatory Visit
Admission: RE | Admit: 2017-03-04 | Discharge: 2017-03-04 | Disposition: A | Discharge status: Home / Self Care | Payer: Medicare Other | Source: Ambulatory Visit | Attending: Family | Admitting: Family

## 2017-03-04 DIAGNOSIS — M50323 Other cervical disc degeneration at C6-C7 level: Secondary | ICD-10-CM

## 2017-03-04 DIAGNOSIS — R51 Headache: Secondary | ICD-10-CM

## 2017-03-04 DIAGNOSIS — M50121 Cervical disc disorder at C4-C5 level with radiculopathy: Secondary | ICD-10-CM | POA: Diagnosis not present

## 2017-03-04 DIAGNOSIS — M47812 Spondylosis without myelopathy or radiculopathy, cervical region: Secondary | ICD-10-CM

## 2017-03-04 DIAGNOSIS — M50122 Cervical disc disorder at C5-C6 level with radiculopathy: Secondary | ICD-10-CM | POA: Diagnosis not present

## 2017-03-04 DIAGNOSIS — R519 Headache, unspecified: Secondary | ICD-10-CM

## 2017-03-29 DIAGNOSIS — J019 Acute sinusitis, unspecified: Secondary | ICD-10-CM | POA: Diagnosis not present

## 2017-03-31 ENCOUNTER — Other Ambulatory Visit: Payer: Self-pay | Admitting: Family

## 2017-03-31 ENCOUNTER — Ambulatory Visit
Admission: RE | Admit: 2017-03-31 | Discharge: 2017-03-31 | Disposition: A | Discharge status: Home / Self Care | Payer: Medicare Other | Source: Ambulatory Visit | Attending: Family | Admitting: Family

## 2017-03-31 DIAGNOSIS — R52 Pain, unspecified: Secondary | ICD-10-CM

## 2017-03-31 DIAGNOSIS — M25551 Pain in right hip: Secondary | ICD-10-CM | POA: Diagnosis not present

## 2017-04-06 DIAGNOSIS — M26622 Arthralgia of left temporomandibular joint: Secondary | ICD-10-CM | POA: Diagnosis not present

## 2017-04-06 DIAGNOSIS — M47812 Spondylosis without myelopathy or radiculopathy, cervical region: Secondary | ICD-10-CM | POA: Diagnosis not present

## 2017-04-06 DIAGNOSIS — M5481 Occipital neuralgia: Secondary | ICD-10-CM | POA: Diagnosis not present

## 2017-04-06 DIAGNOSIS — M461 Sacroiliitis, not elsewhere classified: Secondary | ICD-10-CM | POA: Diagnosis not present

## 2017-04-06 DIAGNOSIS — M62838 Other muscle spasm: Secondary | ICD-10-CM | POA: Diagnosis not present

## 2017-04-06 DIAGNOSIS — M50323 Other cervical disc degeneration at C6-C7 level: Secondary | ICD-10-CM | POA: Diagnosis not present

## 2017-04-15 DIAGNOSIS — G501 Atypical facial pain: Secondary | ICD-10-CM | POA: Diagnosis not present

## 2017-04-23 DIAGNOSIS — G500 Trigeminal neuralgia: Secondary | ICD-10-CM | POA: Diagnosis not present

## 2017-04-23 DIAGNOSIS — G894 Chronic pain syndrome: Secondary | ICD-10-CM | POA: Diagnosis not present

## 2017-04-28 ENCOUNTER — Ambulatory Visit: Payer: Medicare Other | Admitting: Family Medicine

## 2017-04-28 ENCOUNTER — Encounter: Payer: Self-pay | Admitting: Family Medicine

## 2017-04-28 VITALS — BP 110/75 | HR 67 | Temp 98.1°F | Resp 20 | Wt 194.2 lb

## 2017-04-28 DIAGNOSIS — M2550 Pain in unspecified joint: Secondary | ICD-10-CM

## 2017-04-28 DIAGNOSIS — L659 Nonscarring hair loss, unspecified: Secondary | ICD-10-CM

## 2017-04-28 MED ORDER — CLOBETASOL PROPIONATE 0.05 % EX SOLN: 1.0000 "application " | Freq: Every day | CUTANEOUS | 1 refills | Status: DC

## 2017-04-28 NOTE — Patient Instructions (Signed)
Labs collected today, I will call you with results.  I will call in a foam or solution for your scalp.   Alopecia Areata, Adult Alopecia areata is a condition that causes you to lose hair. You may lose hair on your scalp in patches. In some cases, you may lose all the hair on your scalp (alopecia totalis) or all the hair from your face and body (alopecia universalis). Alopecia areata is an autoimmune disease. This means that your body's defense system (immune system) mistakes normal parts of the body for germs or other things that can make you sick. When you have alopecia areata, the immune system attacks the hair follicles. Alopecia areata usually develops in childhood, but it can develop at any age. For some people, their hair grows back on its own and hair loss does not happen again. For others, their hair may fall out and grow back in cycles. The hair loss may last many years. Having this condition can be emotionally difficult, but it is not dangerous. What are the causes? The cause of this condition is not known. What increases the risk? This condition is more likely to develop in people who have:  A family history of alopecia.  A family history of another autoimmune disease, including type 1 diabetes and rheumatoid arthritis.  Asthma and allergies.  Down syndrome.  What are the signs or symptoms? Round spots of patchy hair loss on the scalp is the main symptom of this condition. The spots may be mildly itchy. Other symptoms include:  Short dark hairs in the bald patches that are wider at the top (exclamation point hairs).  Dents, white spots, or lines in the fingernails or toenails.  Balding and body hair loss. This is rare.  How is this diagnosed? This condition is diagnosed based on your symptoms and family history. Your health care provider will also check your scalp skin, teeth, and nails. Your health care provider may refer you to a specialist in hair and skin disorders  (dermatologist). You may also have tests, including:  A hair pull test.  Blood tests or other screening tests to check for autoimmune diseases, such as thyroid disease or diabetes.  Skin biopsy to confirm the diagnosis.  A procedure to examine the skin with a lighted magnifying instrument (dermoscopy).  How is this treated? There is no cure for alopecia areata. Treatment is aimed at promoting the regrowth of hair and preventing the immune system from overreacting. No single treatment is right for all people with alopecia areata. It depends on the type of hair loss you have and how severe it is. Work with your health care provider to find the best treatment for you. Treatment may include:  Having regular checkups to make sure the condition is not getting worse (watchful waiting).  Steroid creams or pills for 6-8 weeks to stop the immune reaction and help hair to regrow more quickly.  Other topical medicines to alter the immune system response and support the hair growth cycle.  Steroid injections.  Therapy and counseling with a support group or therapist if you are having trouble coping with hair loss.  Follow these instructions at home:  Learn as much as you can about your condition.  Apply topical creams only as told by your health care provider.  Take over-the-counter and prescription medicines only as told by your health care provider.  Consider getting a wig or products to make hair look fuller or to cover bald spots, if you feel uncomfortable  with your appearance.  Get therapy or counseling if you are having a hard time coping with hair loss. Ask your health care provider to recommend a counselor or support group.  Keep all follow-up visits as told by your health care provider. This is important. Contact a health care provider if:  Your hair loss gets worse, even with treatment.  You have new symptoms.  You are struggling emotionally. Summary  Alopecia areata is an  autoimmune condition that makes your body's defense system (immune system) attack the hair follicles. This causes you to lose hair.  Treatments may include regular checkups to make sure that the condition is not getting worse (watchful waiting), medicines, and steroid injections. This information is not intended to replace advice given to you by your health care provider. Make sure you discuss any questions you have with your health care provider. Document Released: 10/13/2003 Document Revised: 03/28/2016 Document Reviewed: 03/28/2016 Elsevier Interactive Patient Education  2018 [employer].

## 2017-04-28 NOTE — Progress Notes (Signed)
Diana Gross , [birth year], 62 y.o., female MRN: [id number] Patient Care Team    Relationship Specialty Notifications Start End  Ma Hillock, DO PCP - General Family Medicine  12/15/16   Vicki Mallet, DO  Osteopathic Medicine  05/17/15    Comment: receives OMT  Theimer, Court Joy, NP  Gynecology  05/17/15     Chief Complaint  Patient presents with  . loss of hair    nails thin     Subjective:  Hair loss:  Patient was seen September last year for hair loss. At that time workup with thyroid panel, vitamin D, iron panel were all normal. 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 Patient reporting since that time she continues to have thinning hair. She denies any areas of complete hair loss. She also feels her nails her nails are chipping.  Prior note 12/15/2016: Pt states her hair dresser feels she is losing hair over the last 5 months. She has noticed it mostly in the front of her scalp, especially when it is pulled back in a pony tail. She has stopped wearing ponytails and stopped using conditioner. She states she's always had a very thick head of hair, and now she feels like it is thinner. She reports blow drying her hair and brushing her hair, she loses a handful every day. She has started a multivitamin. She denies changes in bowel habits, she is on narcotics, therefore struggles with constipation. She denies any changes with dry skin, she has noticed mild nail breakage. She denies any physical/emotional stress.   Depression screen PHQ 2/9 07/02/2015  Decreased Interest 0  Down, Depressed, Hopeless 0  PHQ - 2 Score 0    Allergies  Allergen Reactions  . Amitriptyline Rash    Other Reaction: involuntary movements  . Baclofen Diarrhea and Other (See Comments)    Other Reaction: rigid muscle, cramping  . Bupropion Other (See Comments)    Other Reaction: insominia, severe agitation  . Clonazepam Itching  . Duloxetine Rash   Other Reaction: high blood sugar, skin rash  . Fluoxetine Anxiety    Other Reaction: insomnia, severe agitation  . Gabapentin Other (See Comments)    Other Reaction: throat closing up, hallucinates  . Oxcarbazepine Rash  . Paroxetine Hcl Anxiety    Other Reaction: insomnia, severe agitation  . Penicillins Nausea And Vomiting  . Tapentadol Rash  . Topiramate     Other reaction(s): Other (See Comments) Other Reaction: inconttinence/urine  . Tramadol Anxiety    Other Reaction: heart racing/ panic attacks  . Carbamazepine Rash  . Carisoprodol Nausea Only  . Ibuprofen Rash  . Metaxalone Other (See Comments)    Other Reaction: Other reaction  . Naproxen Sodium Rash  . Oxymorphone Rash  . Augmentin [Amoxicillin-Pot Clavulanate]   . Decadron  [Dexamethasone]     Other reaction(s): Unknown Uncoded Allergy. Allergen: DECADRON  . Fentanyl Other (See Comments)    Suicidal ideation  . Pregabalin Swelling    Other Reaction: swelling of hands/feet  . Prozac [Fluoxetine Hcl] Other (See Comments)    insomnia  . Ativan [Lorazepam] Anxiety  . Celebrex [Celecoxib] Rash  . Cymbalta [Duloxetine Hcl] Rash    Elevated blood sugar  . Flexeril [Cyclobenzaprine] Rash and Itching  . Nortriptyline Rash  . Opana [Oxymorphone Hcl] Rash  . Valium [Diazepam] Rash  . Vimpat [Lacosamide] Rash  . Zorvolex [Diclofenac] Anxiety   Social History   Tobacco Use  . Smoking status:  Never Smoker  . Smokeless tobacco: Never Used  Substance Use Topics  . Alcohol use: No    Alcohol/week: 0.0 oz   Past Medical History:  Diagnosis Date  . Abnormal uterine bleeding    prior to hysterectomy  . Allergy   . Anemia    prior to hysterectomy  . Anxiety   . Arthritis    right hip  . Basal cell carcinoma 2015   Right shin.   Marland Kitchen BCC (basal cell carcinoma of skin)   . Cervical pain   . Complex regional pain syndrome   . Depression   . Epidemic cervical myalgia   . Family history of breast cancer   . Fibroid     reason for Hysterectomy  . Genetic testing 01/08/2017   Common Cancers panel (47 genes) @ [facility] - No pathogenic mutation detected  . Heart murmur    Grade I  . History of abnormal cervical Pap smear 1989   --hx colpo/cryotherapy to cervix   . 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 Hx of migraines   . Neuromuscular disorder (HCC)    fibromyalgia, myofasical pain  . Occipital neuralgia   . Tic [place]re[place]   . TMJ (dislocation of temporomandibular joint)   . Trigeminal neuralgia   . Urinary incontinence   . Vaginal prolapse 2016   cystocele and rectocele   Past Surgical History:  Procedure Laterality Date  . APPENDECTOMY  1972  . CHOLECYSTECTOMY  1995  . CRANIECTOMY  2010   due to Trigeminal neuralgia at [facility]  . URETHRAL DILATION  1974  . URETHRAL DIVERTICULUM REPAIR  1992  . VAGINAL HYSTERECTOMY  1997   Family History  Problem Relation Age of Onset  . Breast cancer Mother 35       TAH/BSO in late 12s  . Diabetes Mellitus II Mother   . Diabetes Mother 49       Type 2  . Hyperlipidemia Mother   . Aplastic anemia Brother 18       passed away age 20 with Leukemia  . Heart disease Father        Dec 72 CHF  . Alcoholism Father   . Hypertension Father   . Stroke Maternal Grandmother        dec age 47  . Alcoholism Sister   . Migraines Sister   . Testicular cancer Son 28  . Cancer Paternal Uncle 80       "Spinal"   Allergies as of 04/28/2017      Reactions   Amitriptyline Rash   Other Reaction: involuntary movements   Baclofen Diarrhea, Other (See Comments)   Other Reaction: rigid muscle, cramping   Bupropion Other (See Comments)   Other Reaction: insominia, severe agitation   Clonazepam Itching   Duloxetine Rash   Other Reaction: high blood sugar, skin rash   Fluoxetine Anxiety   Other Reaction: insomnia, severe agitation   Gabapentin Other (See Comments)   Other Reaction: throat closing up, hallucinates    Oxcarbazepine Rash   Paroxetine Hcl Anxiety   Other Reaction: insomnia, severe agitation   Penicillins Nausea And Vomiting   Tapentadol Rash   Topiramate    Other reaction(s): Other (See Comments) Other Reaction: inconttinence/urine   Tramadol Anxiety   Other Reaction: heart racing/ panic attacks   Carbamazepine Rash   Carisoprodol Nausea Only   Ibuprofen Rash   Metaxalone Other (See Comments)   Other Reaction: Other  reaction   Naproxen Sodium Rash   Oxymorphone Rash   Augmentin [amoxicillin-pot Clavulanate]    Decadron  [dexamethasone]    Other reaction(s): Unknown Uncoded Allergy. Allergen: DECADRON   Fentanyl Other (See Comments)   Suicidal ideation   Pregabalin Swelling   Other Reaction: swelling of hands/feet   Prozac [fluoxetine Hcl] Other (See Comments)   insomnia   Ativan [lorazepam] Anxiety   Celebrex [celecoxib] Rash   Cymbalta [duloxetine Hcl] Rash   Elevated blood sugar   Flexeril [cyclobenzaprine] Rash, Itching   Nortriptyline Rash   Opana [oxymorphone Hcl] Rash   Valium [diazepam] Rash   Vimpat [lacosamide] Rash   Zorvolex [diclofenac] Anxiety      Medication List        Accurate as of 04/28/17  3:14 PM. Always use your most recent med list.          ALPRAZolam 1 MG tablet Commonly known as:  XANAX Takes 1/2 to 1 tablet up to 3 times a day   b complex vitamins tablet Take 1 tablet by mouth daily.   conjugated estrogens vaginal cream Commonly known as:  PREMARIN Place 1/2 gram vaginally QHS 2 X per week   D3 SUPER STRENGTH 2000 units Caps Generic drug:  Cholecalciferol Take 2,000 Units by mouth daily.   estradiol 0.05 mg/24hr patch Commonly known as:  CLIMARA - Dosed in mg/24 hr Place 1 patch (0.05 mg total) onto the skin once a week.   magnesium 30 MG tablet Take 125 mg by mouth 2 (two) times daily.   nystatin powder Commonly known as:  NYAMYC Apply topically 2 (two) times daily. Use for one week as needed.   ondansetron 4 MG  disintegrating tablet Commonly known as:  ZOFRAN-ODT Take 4 mg by mouth every 8 (eight) hours as needed for nausea or vomiting.   oxyCODONE 20 MG/ML concentrated solution Commonly known as:  ROXICODONE INTENSOL Take 0.5 mLs by mouth as needed (for breakthrough pain only).   oxyCODONE 15 MG immediate release tablet Commonly known as:  ROXICODONE Take 1 tablet by mouth every 4 (four) hours.   OXYCONTIN 15 mg 12 hr tablet Generic drug:  oxyCODONE Take 45 mg by mouth at bedtime.   SUMAtriptan 50 MG tablet Commonly known as:  IMITREX   TART CHERRY ADVANCED PO Take by mouth.   ZANTAC 75 75 MG tablet Generic drug:  ranitidine Take 1 tablet by mouth daily.   zolpidem 5 MG tablet Commonly known as:  AMBIEN Take 5 mg by mouth at bedtime.       All past medical history, surgical history, allergies, family history, immunizations andmedications were updated in the EMR today and reviewed under the history and medication portions of their EMR.     ROS: Negative, with the exception of above mentioned in HPI   Objective:  BP 110/75 (BP Location: Left Arm, Patient Position: Sitting, Cuff Size: Large)   Pulse 67   Temp 98.1 F (36.7 C)   Resp 20   Wt 194 lb 4 oz (88.1 kg)   SpO2 98%   BMI 29.54 kg/m  Body mass index is 29.54 kg/m.  Gen: Afebrile. No acute distress.  HENT: AT. [place].  MMM. Negative hair pull test. Mild thinning bilateral temporal areas and centrally near part. Brown scaly stuck on plaques present posterior temporal area left.  Neuro:  Normal gait. PERLA. EOMi. Alert. Oriented 3     No exam data present No results found. No results found for this or  any previous visit (from the past 24 hour(s)).  Assessment/Plan: KETZIA GUZEK is a 62 y.o. female present for OV for  Hair loss - Vitamin D, TSH and iron studies were all normal at the beginning of workup. Patient does not feel her symptoms are any better and is concerning for autoimmune disorders. Agreed  to test for ANA and if positive will pursue further workup. Discussed with her positive ANA does not necessarily mean autoimmune disorder. - Clobetasol solution prescribed. - Follow-up dependent upon laboratory results.   Reviewed expectations re: course of current medical issues.  Discussed self-management of symptoms.  Outlined signs and symptoms indicating need for more acute intervention.  Patient verbalized understanding and all questions were answered.  Patient received an After-Visit Summary.   No orders of the defined types were placed in this encounter.  Note is dictated utilizing voice recognition software. Although note has been proof read prior to signing, occasional typographical errors still can be missed. If any questions arise, please do not hesitate to call for verification.   electronically signed by:  Howard Pouch, DO  [place]

## 2017-04-29 LAB — ANA: ANA: NEGATIVE

## 2017-05-01 ENCOUNTER — Ambulatory Visit: Payer: Self-pay | Admitting: Family Medicine

## 2017-05-04 DIAGNOSIS — M5481 Occipital neuralgia: Secondary | ICD-10-CM | POA: Diagnosis not present

## 2017-05-04 DIAGNOSIS — M62838 Other muscle spasm: Secondary | ICD-10-CM | POA: Diagnosis not present

## 2017-05-04 DIAGNOSIS — M26622 Arthralgia of left temporomandibular joint: Secondary | ICD-10-CM | POA: Diagnosis not present

## 2017-05-10 DIAGNOSIS — Z888 Allergy status to other drugs, medicaments and biological substances status: Secondary | ICD-10-CM | POA: Diagnosis not present

## 2017-05-10 DIAGNOSIS — Z79818 Long term (current) use of other agents affecting estrogen receptors and estrogen levels: Secondary | ICD-10-CM | POA: Diagnosis not present

## 2017-05-10 DIAGNOSIS — K219 Gastro-esophageal reflux disease without esophagitis: Secondary | ICD-10-CM | POA: Diagnosis not present

## 2017-05-10 DIAGNOSIS — R0789 Other chest pain: Secondary | ICD-10-CM | POA: Diagnosis not present

## 2017-05-10 DIAGNOSIS — Z886 Allergy status to analgesic agent status: Secondary | ICD-10-CM | POA: Diagnosis not present

## 2017-05-10 DIAGNOSIS — R079 Chest pain, unspecified: Secondary | ICD-10-CM | POA: Diagnosis not present

## 2017-05-10 DIAGNOSIS — Z79899 Other long term (current) drug therapy: Secondary | ICD-10-CM | POA: Diagnosis not present

## 2017-05-10 DIAGNOSIS — Z885 Allergy status to narcotic agent status: Secondary | ICD-10-CM | POA: Diagnosis not present

## 2017-05-10 DIAGNOSIS — R9431 Abnormal electrocardiogram [ECG] [EKG]: Secondary | ICD-10-CM | POA: Diagnosis not present

## 2017-05-10 DIAGNOSIS — M791 Myalgia, unspecified site: Secondary | ICD-10-CM | POA: Diagnosis not present

## 2017-05-10 DIAGNOSIS — M542 Cervicalgia: Secondary | ICD-10-CM | POA: Diagnosis not present

## 2017-05-11 DIAGNOSIS — R079 Chest pain, unspecified: Secondary | ICD-10-CM | POA: Diagnosis not present

## 2017-05-12 ENCOUNTER — Encounter: Payer: Self-pay | Admitting: Family Medicine

## 2017-05-15 ENCOUNTER — Encounter: Payer: Self-pay | Admitting: Family Medicine

## 2017-05-15 ENCOUNTER — Ambulatory Visit: Payer: Medicare Other | Admitting: Family Medicine

## 2017-05-15 VITALS — BP 128/79 | HR 80 | Resp 16 | Ht 68.0 in | Wt 191.0 lb

## 2017-05-15 DIAGNOSIS — G894 Chronic pain syndrome: Secondary | ICD-10-CM

## 2017-05-15 DIAGNOSIS — R030 Elevated blood-pressure reading, without diagnosis of hypertension: Secondary | ICD-10-CM | POA: Diagnosis not present

## 2017-05-15 DIAGNOSIS — R0789 Other chest pain: Secondary | ICD-10-CM | POA: Diagnosis not present

## 2017-05-15 NOTE — Progress Notes (Signed)
OFFICE VISIT  05/15/2017   CC:  Chief Complaint  Patient presents with  . Hospitalization Follow-up    chest pain, rib pain     HPI:    Patient is a 62 y.o. Caucasian female who presents accompanied by husband for recent musculoskeletal complaints and recent elevation of BP.  Went to [facility] ED (reviewed note for this encounter today) for some hip and L upper CP that migrated to L side of neck and some in L arm 05/10/17.  Some L chest wall symptoms were determined to be musculoskeletal (normal EKG, D-dimer, CMET, CBC, CXR normal, Tni neg). Has chronic pain syndrome, unclear etiology, ? Complex regional pain syndrome.  Some migrating muscle aches continue in R lower rib cage, other various areas---on and off.   Her bp was elevated to 170s-180s/70s-80s in the ED so she was told to f/u here.  No bp checks at home since.  She was in signif pain while there + anxious. Review of bp's here in the past avg 120s/70s. She sees a chronic pain specialist and an MD in [place] who does some osteopathic manipulation on her and she recalls no hx of elevated bp's in their offices.   Past Medical History:  Diagnosis Date  . Abnormal uterine bleeding    prior to hysterectomy  . Allergy   . Anemia    prior to hysterectomy  . Anxiety   . Arthritis    right hip  . Basal cell carcinoma 2015   Right shin.   Marland Kitchen BCC (basal cell carcinoma of skin)   . Cervical pain   . Complex regional pain syndrome   . Depression   . Epidemic cervical myalgia   . Family history of breast cancer   . Fibroid    reason for Hysterectomy  . Genetic testing 01/08/2017   Common Cancers panel (47 genes) @ [facility] - No pathogenic mutation detected  . Heart murmur    Grade I  . History of abnormal cervical Pap smear 1989   --hx colpo/cryotherapy to cervix   . 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 Hx of migraines   . Neuromuscular disorder (HCC)    fibromyalgia, myofasical  pain  . Occipital neuralgia   . Tic douloureux   . TMJ (dislocation of temporomandibular joint)   . Trigeminal neuralgia   . Urinary incontinence   . Vaginal prolapse 2016   cystocele and rectocele    Past Surgical History:  Procedure Laterality Date  . APPENDECTOMY  1972  . CHOLECYSTECTOMY  1995  . CRANIECTOMY  2010   due to Trigeminal neural[facility]  . URETHRAL DILATION  1974  . URETHRAL DIVERTICULUM REPAIR  1992  . VAGINAL HYSTERECTOMY  1997    Outpatient Medications Prior to Visit  Medication Sig Dispense Refill  . ALPRAZolam (XANAX) 1 MG tablet Takes 1/2 to 1 tablet up to 3 times a day  0  . b complex vitamins tablet Take 1 tablet by mouth daily.    . Cholecalciferol (D3 SUPER STRENGTH) 2000 units CAPS Take 2,000 Units by mouth daily.    . clobetasol (TEMOVATE) 0.05 % external solution Apply 1 application topically at bedtime. 50 mL 1  . conjugated estrogens (PREMARIN) vaginal cream Place 1/2 gram vaginally QHS 2 X per week 42.5 g 1  . estradiol (CLIMARA - DOSED IN MG/24 HR) 0.05 mg/24hr patch Place 1 patch (0.05 mg total) onto the skin once a  week. 4 patch 12  . magnesium 30 MG tablet Take 125 mg by mouth 2 (two) times daily.     . Misc Natural Products (TART CHERRY ADVANCED PO) Take by mouth.    . nystatin [facility]) powder Apply topically 2 (two) times daily. Use for one week as needed. 15 g 2  . ondansetron (ZOFRAN-ODT) 4 MG disintegrating tablet Take 4 mg by mouth every 8 (eight) hours as needed for nausea or vomiting.    Marland Kitchen oxyCODONE (ROXICODONE INTENSOL) 20 MG/ML concentrated solution Take 0.5 mLs by mouth as needed (for breakthrough pain only).   0  . oxyCODONE (ROXICODONE) 15 MG immediate release tablet Take 1 tablet by mouth every 4 (four) hours.  0  . OXYCONTIN 15 MG 12 hr tablet Take 45 mg by mouth at bedtime.  0  . ranitidine (ZANTAC 75) 75 MG tablet Take 1 tablet by mouth daily.    . SUMAtriptan (IMITREX) 50 MG tablet   0  . zolpidem (AMBIEN) 5 MG tablet Take  5 mg by mouth at bedtime.  0   No facility-administered medications prior to visit.     Allergies  Allergen Reactions  . Amitriptyline Rash    Other Reaction: involuntary movements  . Baclofen Diarrhea and Other (See Comments)    Other Reaction: rigid muscle, cramping  . Bupropion Other (See Comments)    Other Reaction: insominia, severe agitation  . Clonazepam Itching  . Duloxetine Rash    Other Reaction: high blood sugar, skin rash  . Fluoxetine Anxiety    Other Reaction: insomnia, severe agitation  . Gabapentin Other (See Comments)    Other Reaction: throat closing up, hallucinates  . Oxcarbazepine Rash  . Paroxetine Hcl Anxiety    Other Reaction: insomnia, severe agitation  . Penicillins Nausea And Vomiting  . Tapentadol Rash  . Topiramate     Other reaction(s): Other (See Comments) Other Reaction: inconttinence/urine  . Tramadol Anxiety    Other Reaction: heart racing/ panic attacks  . Carbamazepine Rash  . Carisoprodol Nausea Only  . Ibuprofen Rash  . Metaxalone Other (See Comments)    Other Reaction: Other reaction  . Morphine Rash  . Naproxen Sodium Rash  . Oxymorphone Rash  . Augmentin [Amoxicillin-Pot Clavulanate]   . Decadron  [Dexamethasone]     Other reaction(s): Unknown Uncoded Allergy. Allergen: DECADRON  . Fentanyl Other (See Comments)    Suicidal ideation  . Pregabalin Swelling    Other Reaction: swelling of hands/feet  . Prozac [Fluoxetine Hcl] Other (See Comments)    insomnia  . Ativan [Lorazepam] Anxiety  . Celebrex [Celecoxib] Rash  . Cymbalta [Duloxetine Hcl] Rash    Elevated blood sugar  . Flexeril [Cyclobenzaprine] Rash and Itching  . Nortriptyline Rash  . Opana [Oxymorphone Hcl] Rash  . Valium [Diazepam] Rash  . Vimpat [Lacosamide] Rash  . Zorvolex [Diclofenac] Anxiety    ROS As per HPI  PE: Blood pressure 128/79, pulse 80, resp. rate 16, height 5\' 8"  (1.727 m), weight 191 lb (86.6 kg), SpO2 97 %. Gen: Alert, well appearing.   Patient is oriented to person, place, time, and situation. AFFECT: pleasant, lucid thought and speech. CV: RRR, no m/r/g.   LUNGS: CTA bilat, nonlabored resps, good aeration in all lung fields. EXT: no clubbing, cyanosis, or edema.    LABS:  None today (reviewed from 05/10/17 ED visit, as per HPI).  IMPRESSION AND PLAN:  1) Elevated bp w/out dx of HTN: suspect this was due to acute pain and  anxiety. No hx of elevated bp's prior this or since. BP good here today. No further w/u or new recommendations at this time.  2) Chronic pain syndrome: reassured no sign of cardiopulmonary etiology of recent chest wall pain. She has some migrating pain in muscles that seems to be a "flare" of her chronic pain syndrome. She'll continue current meds for this and continue f/u with her specialist.  An After Visit Summary was printed and given to the patient.  FOLLOW UP: Return for as needed.  Signed:  Crissie Sickles, MD           05/15/2017

## 2017-05-19 DIAGNOSIS — G500 Trigeminal neuralgia: Secondary | ICD-10-CM | POA: Diagnosis not present

## 2017-05-19 DIAGNOSIS — M9900 Segmental and somatic dysfunction of head region: Secondary | ICD-10-CM | POA: Diagnosis not present

## 2017-05-19 DIAGNOSIS — M9901 Segmental and somatic dysfunction of cervical region: Secondary | ICD-10-CM | POA: Diagnosis not present

## 2017-05-19 DIAGNOSIS — M7918 Myalgia, other site: Secondary | ICD-10-CM | POA: Diagnosis not present

## 2017-05-27 DIAGNOSIS — M62838 Other muscle spasm: Secondary | ICD-10-CM | POA: Diagnosis not present

## 2017-05-27 DIAGNOSIS — M5481 Occipital neuralgia: Secondary | ICD-10-CM | POA: Diagnosis not present

## 2017-05-27 DIAGNOSIS — M47812 Spondylosis without myelopathy or radiculopathy, cervical region: Secondary | ICD-10-CM | POA: Diagnosis not present

## 2017-05-27 DIAGNOSIS — G501 Atypical facial pain: Secondary | ICD-10-CM | POA: Diagnosis not present

## 2017-06-26 DIAGNOSIS — M5481 Occipital neuralgia: Secondary | ICD-10-CM | POA: Diagnosis not present

## 2017-06-26 DIAGNOSIS — G501 Atypical facial pain: Secondary | ICD-10-CM | POA: Diagnosis not present

## 2017-06-26 DIAGNOSIS — M62838 Other muscle spasm: Secondary | ICD-10-CM | POA: Diagnosis not present

## 2017-06-26 DIAGNOSIS — M25512 Pain in left shoulder: Secondary | ICD-10-CM | POA: Diagnosis not present

## 2017-07-07 DIAGNOSIS — M9907 Segmental and somatic dysfunction of upper extremity: Secondary | ICD-10-CM | POA: Diagnosis not present

## 2017-07-07 DIAGNOSIS — M26629 Arthralgia of temporomandibular joint, unspecified side: Secondary | ICD-10-CM | POA: Diagnosis not present

## 2017-07-07 DIAGNOSIS — M7918 Myalgia, other site: Secondary | ICD-10-CM | POA: Diagnosis not present

## 2017-07-07 DIAGNOSIS — G500 Trigeminal neuralgia: Secondary | ICD-10-CM | POA: Diagnosis not present

## 2017-07-07 DIAGNOSIS — M9901 Segmental and somatic dysfunction of cervical region: Secondary | ICD-10-CM | POA: Diagnosis not present

## 2017-07-07 DIAGNOSIS — G5643 Causalgia of bilateral upper limbs: Secondary | ICD-10-CM | POA: Diagnosis not present

## 2017-07-07 DIAGNOSIS — M9902 Segmental and somatic dysfunction of thoracic region: Secondary | ICD-10-CM | POA: Diagnosis not present

## 2017-07-07 DIAGNOSIS — M9900 Segmental and somatic dysfunction of head region: Secondary | ICD-10-CM | POA: Diagnosis not present

## 2017-07-23 DIAGNOSIS — M5481 Occipital neuralgia: Secondary | ICD-10-CM | POA: Diagnosis not present

## 2017-07-23 DIAGNOSIS — M62838 Other muscle spasm: Secondary | ICD-10-CM | POA: Diagnosis not present

## 2017-07-23 DIAGNOSIS — M26621 Arthralgia of right temporomandibular joint: Secondary | ICD-10-CM | POA: Diagnosis not present

## 2017-07-23 DIAGNOSIS — G894 Chronic pain syndrome: Secondary | ICD-10-CM | POA: Diagnosis not present

## 2017-07-23 DIAGNOSIS — M26622 Arthralgia of left temporomandibular joint: Secondary | ICD-10-CM | POA: Diagnosis not present

## 2017-08-04 DIAGNOSIS — M9901 Segmental and somatic dysfunction of cervical region: Secondary | ICD-10-CM | POA: Diagnosis not present

## 2017-08-04 DIAGNOSIS — M9900 Segmental and somatic dysfunction of head region: Secondary | ICD-10-CM | POA: Diagnosis not present

## 2017-08-04 DIAGNOSIS — G500 Trigeminal neuralgia: Secondary | ICD-10-CM | POA: Diagnosis not present

## 2017-08-04 DIAGNOSIS — M26629 Arthralgia of temporomandibular joint, unspecified side: Secondary | ICD-10-CM | POA: Diagnosis not present

## 2017-08-05 DIAGNOSIS — M26622 Arthralgia of left temporomandibular joint: Secondary | ICD-10-CM | POA: Diagnosis not present

## 2017-08-05 DIAGNOSIS — M26621 Arthralgia of right temporomandibular joint: Secondary | ICD-10-CM | POA: Diagnosis not present

## 2017-08-10 DIAGNOSIS — M26622 Arthralgia of left temporomandibular joint: Secondary | ICD-10-CM | POA: Diagnosis not present

## 2017-08-10 DIAGNOSIS — M26621 Arthralgia of right temporomandibular joint: Secondary | ICD-10-CM | POA: Diagnosis not present

## 2017-08-10 DIAGNOSIS — M5481 Occipital neuralgia: Secondary | ICD-10-CM | POA: Diagnosis not present

## 2017-08-10 DIAGNOSIS — M62838 Other muscle spasm: Secondary | ICD-10-CM | POA: Diagnosis not present

## 2017-08-23 ENCOUNTER — Encounter: Payer: Self-pay | Admitting: Obstetrics and Gynecology

## 2017-08-24 ENCOUNTER — Telehealth: Payer: Self-pay | Admitting: Obstetrics and Gynecology

## 2017-08-24 ENCOUNTER — Other Ambulatory Visit: Payer: Self-pay | Admitting: Obstetrics and Gynecology

## 2017-08-24 MED ORDER — ESTROGENS, CONJUGATED 0.625 MG/GM VA CREA: TOPICAL_CREAM | VAGINAL | 0 refills | Status: DC

## 2017-08-24 NOTE — Telephone Encounter (Signed)
Patient sent the following correspondence through [place]. Routing to refill pool to assist patient with request.  ----- Message from [place], Generic sent at 08/23/2017 2:48 PM EDT -----    Hi, I normally have my annual exam with Dr. Quincy Simmonds in August after my annual mammogram. However I am out of Premarin cream that I use to control a vaginal prolapse condition.   Can she possibly call in a refill now to get me through until an August appointment? [facility] in [place].   Thank you, Weston Brass

## 2017-08-24 NOTE — Telephone Encounter (Signed)
Needs annual exam

## 2017-08-24 NOTE — Telephone Encounter (Signed)
Medication refill request: Premarin  Last AEX:  11-13-16  Next AEX: N/A Last MMG (if hormonal medication request): 10-28-16 WNL  Refill authorized: please advise

## 2017-08-24 NOTE — Telephone Encounter (Signed)
Patient sent the following correspondence through [place]. Routing to triage to assist patient with request.  ----- Message from [place], Generic sent at 08/24/2017 11:40 AM EDT -----    Thank you so much. Im unable to talk on the phone due to a painful facial nerve condition but I can have my husband call. Medicare wont pay for manmigram until a year later so he will have to schedule that and then my exam at your office, since she always wants that before refilling HRT patch.   ----- Message -----  From: Nurse Gena Fray  Sent: 08/24/2017 9:44 AM EDT  To: Diana Gross  Subject: refill request  [name], I have sent your refill request to Dr. Quincy Simmonds. It does look like you need to go ahead and scheduled your annual exam. Please call our office to schedule with the front desk.  Thanks  Starlyn Skeans, LPN

## 2017-08-24 NOTE — Telephone Encounter (Signed)
Left message to call back regarding RX request and needing to schedule/ Sent ,mychart message as well.  AEX-eh

## 2017-09-08 DIAGNOSIS — M9900 Segmental and somatic dysfunction of head region: Secondary | ICD-10-CM | POA: Diagnosis not present

## 2017-09-08 DIAGNOSIS — M26629 Arthralgia of temporomandibular joint, unspecified side: Secondary | ICD-10-CM | POA: Diagnosis not present

## 2017-09-08 DIAGNOSIS — G500 Trigeminal neuralgia: Secondary | ICD-10-CM | POA: Diagnosis not present

## 2017-09-08 DIAGNOSIS — M9901 Segmental and somatic dysfunction of cervical region: Secondary | ICD-10-CM | POA: Diagnosis not present

## 2017-09-28 DIAGNOSIS — M5481 Occipital neuralgia: Secondary | ICD-10-CM | POA: Diagnosis not present

## 2017-09-28 DIAGNOSIS — M62838 Other muscle spasm: Secondary | ICD-10-CM | POA: Diagnosis not present

## 2017-10-06 DIAGNOSIS — M26629 Arthralgia of temporomandibular joint, unspecified side: Secondary | ICD-10-CM | POA: Diagnosis not present

## 2017-10-06 DIAGNOSIS — M9908 Segmental and somatic dysfunction of rib cage: Secondary | ICD-10-CM | POA: Diagnosis not present

## 2017-10-06 DIAGNOSIS — M542 Cervicalgia: Secondary | ICD-10-CM | POA: Diagnosis not present

## 2017-10-06 DIAGNOSIS — M9900 Segmental and somatic dysfunction of head region: Secondary | ICD-10-CM | POA: Diagnosis not present

## 2017-10-06 DIAGNOSIS — M9903 Segmental and somatic dysfunction of lumbar region: Secondary | ICD-10-CM | POA: Diagnosis not present

## 2017-10-06 DIAGNOSIS — M7918 Myalgia, other site: Secondary | ICD-10-CM | POA: Diagnosis not present

## 2017-10-06 DIAGNOSIS — G500 Trigeminal neuralgia: Secondary | ICD-10-CM | POA: Diagnosis not present

## 2017-10-06 DIAGNOSIS — M9901 Segmental and somatic dysfunction of cervical region: Secondary | ICD-10-CM | POA: Diagnosis not present

## 2017-10-19 DIAGNOSIS — M5481 Occipital neuralgia: Secondary | ICD-10-CM | POA: Diagnosis not present

## 2017-10-19 DIAGNOSIS — M62838 Other muscle spasm: Secondary | ICD-10-CM | POA: Diagnosis not present

## 2017-10-19 DIAGNOSIS — R51 Headache: Secondary | ICD-10-CM | POA: Diagnosis not present

## 2017-10-20 DIAGNOSIS — G43709 Chronic migraine without aura, not intractable, without status migrainosus: Secondary | ICD-10-CM | POA: Diagnosis not present

## 2017-10-27 DIAGNOSIS — Z803 Family history of malignant neoplasm of breast: Secondary | ICD-10-CM | POA: Diagnosis not present

## 2017-10-27 DIAGNOSIS — Z1231 Encounter for screening mammogram for malignant neoplasm of breast: Secondary | ICD-10-CM | POA: Diagnosis not present

## 2017-10-28 DIAGNOSIS — Z0000 Encounter for general adult medical examination without abnormal findings: Secondary | ICD-10-CM | POA: Diagnosis not present

## 2017-11-03 DIAGNOSIS — M7918 Myalgia, other site: Secondary | ICD-10-CM | POA: Diagnosis not present

## 2017-11-03 DIAGNOSIS — M9907 Segmental and somatic dysfunction of upper extremity: Secondary | ICD-10-CM | POA: Diagnosis not present

## 2017-11-03 DIAGNOSIS — M9900 Segmental and somatic dysfunction of head region: Secondary | ICD-10-CM | POA: Diagnosis not present

## 2017-11-03 DIAGNOSIS — M9908 Segmental and somatic dysfunction of rib cage: Secondary | ICD-10-CM | POA: Diagnosis not present

## 2017-11-03 DIAGNOSIS — M9901 Segmental and somatic dysfunction of cervical region: Secondary | ICD-10-CM | POA: Diagnosis not present

## 2017-11-03 DIAGNOSIS — M26629 Arthralgia of temporomandibular joint, unspecified side: Secondary | ICD-10-CM | POA: Diagnosis not present

## 2017-11-03 DIAGNOSIS — R0781 Pleurodynia: Secondary | ICD-10-CM | POA: Diagnosis not present

## 2017-11-03 DIAGNOSIS — G500 Trigeminal neuralgia: Secondary | ICD-10-CM | POA: Diagnosis not present

## 2017-11-17 ENCOUNTER — Encounter: Payer: Self-pay | Admitting: Obstetrics and Gynecology

## 2017-11-19 ENCOUNTER — Ambulatory Visit: Payer: Medicare Other | Admitting: Obstetrics and Gynecology

## 2017-11-24 DIAGNOSIS — M26622 Arthralgia of left temporomandibular joint: Secondary | ICD-10-CM | POA: Diagnosis not present

## 2017-11-24 DIAGNOSIS — M5481 Occipital neuralgia: Secondary | ICD-10-CM | POA: Diagnosis not present

## 2017-11-24 DIAGNOSIS — G501 Atypical facial pain: Secondary | ICD-10-CM | POA: Diagnosis not present

## 2017-11-24 DIAGNOSIS — M62838 Other muscle spasm: Secondary | ICD-10-CM | POA: Diagnosis not present

## 2017-11-26 ENCOUNTER — Encounter: Payer: Self-pay | Admitting: Obstetrics and Gynecology

## 2017-11-26 ENCOUNTER — Ambulatory Visit (INDEPENDENT_AMBULATORY_CARE_PROVIDER_SITE_OTHER): Payer: Medicare Other | Admitting: Obstetrics and Gynecology

## 2017-11-26 ENCOUNTER — Other Ambulatory Visit: Payer: Self-pay

## 2017-11-26 VITALS — BP 118/62 | HR 72 | Resp 16 | Ht 68.25 in | Wt 202.0 lb

## 2017-11-26 DIAGNOSIS — Z124 Encounter for screening for malignant neoplasm of cervix: Secondary | ICD-10-CM | POA: Diagnosis not present

## 2017-11-26 DIAGNOSIS — Z01419 Encounter for gynecological examination (general) (routine) without abnormal findings: Secondary | ICD-10-CM | POA: Diagnosis not present

## 2017-11-26 MED ORDER — ESTRADIOL 0.05 MG/24HR TD PTWK: 0.0500 mg | MEDICATED_PATCH | TRANSDERMAL | 3 refills | Status: DC

## 2017-11-26 MED ORDER — ESTROGENS, CONJUGATED 0.625 MG/GM VA CREA: TOPICAL_CREAM | VAGINAL | 2 refills | Status: DC

## 2017-11-26 NOTE — Progress Notes (Signed)
62 y.o. [id number] Married Caucasian female here for annual exam.    Wants to continue her ERT and vaginal estrogen cream.  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   Bowel function good if she is taking a regular stool softener.  Bladder function controlled if she is using her vaginal estrogen cream.   Working on weight loss.  Seeing a pain medication specialist at [facility].   PCP: Dr. Raoul Pitch     No LMP recorded (lmp unknown). Patient has had a hysterectomy.           Sexually active: Yes.    The current method of family planning is status post hysterectomy.    Exercising: Yes.    stationary bike Smoker:  no  Health Maintenance: Pap:  2015 Normal History of abnormal Pap:  Yes, hx cryotherapy to cervix 1989; paps normal since MMG:  10/27/17 BIRADS 1 negative/density c Colonoscopy:  Done in 80s per patient -- patient has recently done an IFOB through [facility], and it was negative.  BMD:   2010  Result  Normal in [place]:  01/27/14 Gardasil:   n/a HIV: 2010 negative Hep C: unsure Screening Labs:  PCP   reports that she has never smoked. She has never used smokeless tobacco. She reports that she does not drink alcohol or use drugs.  Past Medical History:  Diagnosis Date  . Abnormal uterine bleeding    prior to hysterectomy  . Allergy   . Anemia    prior to hysterectomy  . Anxiety   . Arthritis    right hip  . Basal cell carcinoma 2015   Right shin.   Marland Kitchen BCC (basal cell carcinoma of skin)   . Cervical pain   . Complex regional pain syndrome   . Depression   . Epidemic cervical myalgia   . Family history of breast cancer   . Fibroid    reason for Hysterectomy  . Genetic testing 01/08/2017   Common Cancers panel (47 genes) @ [facility] - No pathogenic mutation detected  . Heart murmur    Grade I  . History of abnormal cervical Pap smear 1989   --hx colpo/cryotherapy to cervix   . History of cranial surgery    with "complications" which has  caused chronic pain   . Hx of migraines   . Neuromuscular disorder (HCC)    fibromyalgia, myofasical pain  . Occipital neuralgia   . Tic douloureux   . TMJ (dislocation of temporomandibular joint)   . Trigeminal neuralgia   . Urinary incontinence   . Vaginal prolapse 2016   cystocele and rectocele    Past Surgical History:  Procedure Laterality Date  . APPENDECTOMY  1972  . CHOLECYSTECTOMY  1995  . CRANIECTOMY  2010   due to [facility] at [facility]  . URETHRAL DILATION  1974  . URETHRAL DIVERTICULUM REPAIR  1992  . VAGINAL HYSTERECTOMY  1997    Current Outpatient Medications  Medication Sig Dispense Refill  . ALPRAZolam (XANAX) 1 MG tablet Takes 1/2 to 1 tablet up to 3 times a day  0  . b complex vitamins tablet Take 1 tablet by mouth daily.    . Cholecalciferol (D3 SUPER STRENGTH) 2000 units CAPS Take 2,000 Units by mouth daily.    Marland Kitchen conjugated estrogens (PREMARIN) vaginal cream Place 1/2 gram vaginally QHS 2 X per week 30 g 0  . docusate sodium (COLACE) 100 MG capsule Take 100 mg by mouth 2 (two) times daily.    Marland Kitchen  estradiol (CLIMARA - DOSED IN MG/24 HR) 0.05 mg/24hr patch Place 1 patch (0.05 mg total) onto the skin once a week. 4 patch 12  . fexofenadine (ALLEGRA) 180 MG tablet Take 180 mg by mouth daily.    . fluticasone (FLONASE) 50 MCG/ACT nasal spray Place into both nostrils daily.    . magnesium 30 MG tablet Take 125 mg by mouth 2 (two) times daily.     . Misc Natural Products (TART CHERRY ADVANCED PO) Take by mouth.    . NON FORMULARY [employer] 500 - 10mg  hemp oil    . ondansetron (ZOFRAN-ODT) 4 MG disintegrating tablet Take 4 mg by mouth every 8 (eight) hours as needed for nausea or vomiting.    Marland Kitchen oxyCODONE (ROXICODONE INTENSOL) 20 MG/ML concentrated solution Take 0.5 mLs by mouth as needed (for breakthrough pain only).   0  . oxyCODONE (ROXICODONE) 15 MG immediate release tablet Take 1 tablet by mouth every 4 (four) hours.  0  . OXYCONTIN 15 MG 12 hr tablet Take  45 mg by mouth.   0  . ranitidine (ZANTAC 75) 75 MG tablet Take 1 tablet by mouth daily.    . SUMAtriptan (IMITREX) 50 MG tablet as needed.   0  . zolpidem (AMBIEN) 5 MG tablet Take 5 mg by mouth at bedtime.  0   No current facility-administered medications for this visit.     Family History  Problem Relation Age of Onset  . Breast cancer Mother 97       TAH/BSO in late 84s  . Diabetes Mellitus II Mother   . Diabetes Mother 34       Type 2  . Hyperlipidemia Mother   . Aplastic anemia Brother 13       passed away age 75 with Leukemia  . Heart disease Father        Dec 72 CHF  . Alcoholism Father   . Hypertension Father   . Stroke Maternal Grandmother        dec age 85  . Alcoholism Sister   . Migraines Sister   . Testicular cancer Son 74  . Cancer Paternal Uncle 46       "Spinal"    Review of Systems  Musculoskeletal:       Muscle or joint pain  Neurological: Positive for headaches.  All other systems reviewed and are negative.   Exam:   BP 118/62 (BP Location: Right Arm, Patient Position: Sitting, Cuff Size: Large)   Pulse 72   Resp 16   Ht 5' 8.25" (1.734 m)   Wt 202 lb (91.6 kg)   LMP  (LMP Unknown)   BMI 30.49 kg/m     General appearance: alert, cooperative and appears stated age Head: Normocephalic, without obvious abnormality, atraumatic Neck: no adenopathy, supple, symmetrical, trachea midline and thyroid normal to inspection and palpation Lungs: clear to auscultation bilaterally Breasts: normal appearance, no masses or tenderness, No nipple retraction or dimpling, No nipple discharge or bleeding, No axillary or supraclavicular adenopathy Heart: regular rate and rhythm Abdomen: soft, non-tender; no masses, no organomegaly Extremities: extremities normal, atraumatic, no cyanosis or edema Skin: Skin color, texture, turgor normal. No rashes or lesions Lymph nodes: Cervical, supraclavicular, and axillary nodes normal. No abnormal inguinal nodes  palpated Neurologic: Grossly normal  Pelvic: External genitalia:  no lesions              Urethra:  normal appearing urethra with no masses, tenderness or lesions  Bartholins and Skenes: normal                 Vagina: normal appearing vagina with normal color and discharge, no lesions.  Good anterior and vaginal apex support.  Second degree rectocele.               Cervix: absent.               Pap taken: No. Bimanual Exam:  Uterus:   absent              Adnexa: no mass, fullness, tenderness              Rectal exam: Yes.  .  Confirms.              Anus:  normal sphincter tone, no lesions  Chaperone was present for exam.  Assessment:   Well woman visit with normal exam. FH of breast CA, testicular CA, and leukemia. Negative genetic testing.  Status post hysterectomy/BSO.  Premature menopause.  ERT patient.  FH of breast cancer in mother.  Status post urethral diverticulum repair.  Status post urethral dilation. Rectocele.  Genuine stress incontinence at max bladder capacity with urodynamic testing.  Voiding dysfunction. Possible retention. Narcotic use may be affecting voiding. Urodynamic testing unable to assess voiding as patient did forceful manipulation to void at the end of the urodynamic study.  Chronic pain and use of narcotics. Complex medical history.  Multiple allergies.   Plan: Mammogram screening. Recommended self breast awareness. Pap and HR HPV as above. Guidelines for Calcium, Vitamin D, regular exercise program including cardiovascular and weight bearing exercise. Refill of transdermal estrogen and vaginal estrogen for one year.  I discussed the WHI and increased risks of stroke, DVT, and PE.  BMD at [facility]. Order will be faxed.  Follow up annually and prn.   After visit summary provided.

## 2017-11-26 NOTE — Patient Instructions (Signed)

## 2017-12-08 DIAGNOSIS — M542 Cervicalgia: Secondary | ICD-10-CM | POA: Diagnosis not present

## 2017-12-08 DIAGNOSIS — M9900 Segmental and somatic dysfunction of head region: Secondary | ICD-10-CM | POA: Diagnosis not present

## 2017-12-08 DIAGNOSIS — G500 Trigeminal neuralgia: Secondary | ICD-10-CM | POA: Diagnosis not present

## 2017-12-08 DIAGNOSIS — M9908 Segmental and somatic dysfunction of rib cage: Secondary | ICD-10-CM | POA: Diagnosis not present

## 2017-12-08 DIAGNOSIS — G5643 Causalgia of bilateral upper limbs: Secondary | ICD-10-CM | POA: Diagnosis not present

## 2017-12-08 DIAGNOSIS — M26629 Arthralgia of temporomandibular joint, unspecified side: Secondary | ICD-10-CM | POA: Diagnosis not present

## 2017-12-08 DIAGNOSIS — M9907 Segmental and somatic dysfunction of upper extremity: Secondary | ICD-10-CM | POA: Diagnosis not present

## 2017-12-08 DIAGNOSIS — M9901 Segmental and somatic dysfunction of cervical region: Secondary | ICD-10-CM | POA: Diagnosis not present

## 2017-12-17 DIAGNOSIS — M62838 Other muscle spasm: Secondary | ICD-10-CM | POA: Diagnosis not present

## 2017-12-17 DIAGNOSIS — M5481 Occipital neuralgia: Secondary | ICD-10-CM | POA: Diagnosis not present

## 2017-12-22 DIAGNOSIS — M26622 Arthralgia of left temporomandibular joint: Secondary | ICD-10-CM | POA: Diagnosis not present

## 2017-12-22 DIAGNOSIS — M5481 Occipital neuralgia: Secondary | ICD-10-CM | POA: Diagnosis not present

## 2017-12-22 DIAGNOSIS — G894 Chronic pain syndrome: Secondary | ICD-10-CM | POA: Diagnosis not present

## 2017-12-22 DIAGNOSIS — M62838 Other muscle spasm: Secondary | ICD-10-CM | POA: Diagnosis not present

## 2018-01-05 DIAGNOSIS — M9908 Segmental and somatic dysfunction of rib cage: Secondary | ICD-10-CM | POA: Diagnosis not present

## 2018-01-05 DIAGNOSIS — M7918 Myalgia, other site: Secondary | ICD-10-CM | POA: Diagnosis not present

## 2018-01-05 DIAGNOSIS — Z23 Encounter for immunization: Secondary | ICD-10-CM | POA: Diagnosis not present

## 2018-01-05 DIAGNOSIS — M9907 Segmental and somatic dysfunction of upper extremity: Secondary | ICD-10-CM | POA: Diagnosis not present

## 2018-01-05 DIAGNOSIS — M26629 Arthralgia of temporomandibular joint, unspecified side: Secondary | ICD-10-CM | POA: Diagnosis not present

## 2018-01-05 DIAGNOSIS — M9900 Segmental and somatic dysfunction of head region: Secondary | ICD-10-CM | POA: Diagnosis not present

## 2018-01-05 DIAGNOSIS — M9901 Segmental and somatic dysfunction of cervical region: Secondary | ICD-10-CM | POA: Diagnosis not present

## 2018-01-05 DIAGNOSIS — G500 Trigeminal neuralgia: Secondary | ICD-10-CM | POA: Diagnosis not present

## 2018-01-18 DIAGNOSIS — G501 Atypical facial pain: Secondary | ICD-10-CM | POA: Diagnosis not present

## 2018-01-18 DIAGNOSIS — M26622 Arthralgia of left temporomandibular joint: Secondary | ICD-10-CM | POA: Diagnosis not present

## 2018-02-02 DIAGNOSIS — M9905 Segmental and somatic dysfunction of pelvic region: Secondary | ICD-10-CM | POA: Diagnosis not present

## 2018-02-02 DIAGNOSIS — M546 Pain in thoracic spine: Secondary | ICD-10-CM | POA: Diagnosis not present

## 2018-02-02 DIAGNOSIS — M9902 Segmental and somatic dysfunction of thoracic region: Secondary | ICD-10-CM | POA: Diagnosis not present

## 2018-02-02 DIAGNOSIS — M25551 Pain in right hip: Secondary | ICD-10-CM | POA: Diagnosis not present

## 2018-03-01 DIAGNOSIS — M62838 Other muscle spasm: Secondary | ICD-10-CM | POA: Diagnosis not present

## 2018-03-01 DIAGNOSIS — M26622 Arthralgia of left temporomandibular joint: Secondary | ICD-10-CM | POA: Diagnosis not present

## 2018-03-01 DIAGNOSIS — M5481 Occipital neuralgia: Secondary | ICD-10-CM | POA: Diagnosis not present

## 2018-03-01 DIAGNOSIS — G43709 Chronic migraine without aura, not intractable, without status migrainosus: Secondary | ICD-10-CM | POA: Diagnosis not present

## 2018-03-02 DIAGNOSIS — M542 Cervicalgia: Secondary | ICD-10-CM | POA: Diagnosis not present

## 2018-03-02 DIAGNOSIS — M9900 Segmental and somatic dysfunction of head region: Secondary | ICD-10-CM | POA: Diagnosis not present

## 2018-03-02 DIAGNOSIS — M26629 Arthralgia of temporomandibular joint, unspecified side: Secondary | ICD-10-CM | POA: Diagnosis not present

## 2018-03-02 DIAGNOSIS — M9901 Segmental and somatic dysfunction of cervical region: Secondary | ICD-10-CM | POA: Diagnosis not present

## 2018-03-26 ENCOUNTER — Ambulatory Visit: Payer: Medicare Other | Admitting: Family Medicine

## 2018-04-20 DIAGNOSIS — M9901 Segmental and somatic dysfunction of cervical region: Secondary | ICD-10-CM | POA: Diagnosis not present

## 2018-04-20 DIAGNOSIS — M9900 Segmental and somatic dysfunction of head region: Secondary | ICD-10-CM | POA: Diagnosis not present

## 2018-04-20 DIAGNOSIS — M26629 Arthralgia of temporomandibular joint, unspecified side: Secondary | ICD-10-CM | POA: Diagnosis not present

## 2018-04-20 DIAGNOSIS — G500 Trigeminal neuralgia: Secondary | ICD-10-CM | POA: Diagnosis not present

## 2018-04-26 DIAGNOSIS — M47812 Spondylosis without myelopathy or radiculopathy, cervical region: Secondary | ICD-10-CM | POA: Diagnosis not present

## 2018-04-26 DIAGNOSIS — M50323 Other cervical disc degeneration at C6-C7 level: Secondary | ICD-10-CM | POA: Diagnosis not present

## 2018-04-26 DIAGNOSIS — M26621 Arthralgia of right temporomandibular joint: Secondary | ICD-10-CM | POA: Diagnosis not present

## 2018-04-26 DIAGNOSIS — M26622 Arthralgia of left temporomandibular joint: Secondary | ICD-10-CM | POA: Diagnosis not present

## 2018-05-12 DIAGNOSIS — G501 Atypical facial pain: Secondary | ICD-10-CM | POA: Diagnosis not present

## 2018-06-01 DIAGNOSIS — M50323 Other cervical disc degeneration at C6-C7 level: Secondary | ICD-10-CM | POA: Diagnosis not present

## 2018-06-01 DIAGNOSIS — M5481 Occipital neuralgia: Secondary | ICD-10-CM | POA: Diagnosis not present

## 2018-06-01 DIAGNOSIS — M47812 Spondylosis without myelopathy or radiculopathy, cervical region: Secondary | ICD-10-CM | POA: Diagnosis not present

## 2018-06-01 DIAGNOSIS — M62838 Other muscle spasm: Secondary | ICD-10-CM | POA: Diagnosis not present

## 2018-06-21 ENCOUNTER — Encounter: Payer: Self-pay | Admitting: Family Medicine

## 2018-06-22 NOTE — Telephone Encounter (Signed)
Pt is asking for a note to get out of jury duty due to her chronic pain and this makes her unable to serve. Please advise.

## 2018-06-22 NOTE — Telephone Encounter (Signed)
Called pt and LM. VM states she does not ever check messages and to text her. My Chart message was sent in case she does not receive VM.

## 2018-07-01 DIAGNOSIS — M62838 Other muscle spasm: Secondary | ICD-10-CM | POA: Diagnosis not present

## 2018-07-01 DIAGNOSIS — M50323 Other cervical disc degeneration at C6-C7 level: Secondary | ICD-10-CM | POA: Diagnosis not present

## 2018-07-01 DIAGNOSIS — M5481 Occipital neuralgia: Secondary | ICD-10-CM | POA: Diagnosis not present

## 2018-07-01 DIAGNOSIS — M47812 Spondylosis without myelopathy or radiculopathy, cervical region: Secondary | ICD-10-CM | POA: Diagnosis not present

## 2018-07-26 DIAGNOSIS — M62838 Other muscle spasm: Secondary | ICD-10-CM | POA: Diagnosis not present

## 2018-07-26 DIAGNOSIS — M5481 Occipital neuralgia: Secondary | ICD-10-CM | POA: Diagnosis not present

## 2018-09-01 DIAGNOSIS — M5481 Occipital neuralgia: Secondary | ICD-10-CM | POA: Diagnosis not present

## 2018-09-01 DIAGNOSIS — M62838 Other muscle spasm: Secondary | ICD-10-CM | POA: Diagnosis not present

## 2018-09-01 DIAGNOSIS — R51 Headache: Secondary | ICD-10-CM | POA: Diagnosis not present

## 2018-09-01 DIAGNOSIS — M47812 Spondylosis without myelopathy or radiculopathy, cervical region: Secondary | ICD-10-CM | POA: Diagnosis not present

## 2018-09-22 DIAGNOSIS — M47812 Spondylosis without myelopathy or radiculopathy, cervical region: Secondary | ICD-10-CM | POA: Diagnosis not present

## 2018-09-22 DIAGNOSIS — M50323 Other cervical disc degeneration at C6-C7 level: Secondary | ICD-10-CM | POA: Diagnosis not present

## 2018-09-22 DIAGNOSIS — G43709 Chronic migraine without aura, not intractable, without status migrainosus: Secondary | ICD-10-CM | POA: Diagnosis not present

## 2018-09-22 DIAGNOSIS — G894 Chronic pain syndrome: Secondary | ICD-10-CM | POA: Diagnosis not present

## 2018-10-12 DIAGNOSIS — M9901 Segmental and somatic dysfunction of cervical region: Secondary | ICD-10-CM | POA: Diagnosis not present

## 2018-10-12 DIAGNOSIS — M26629 Arthralgia of temporomandibular joint, unspecified side: Secondary | ICD-10-CM | POA: Diagnosis not present

## 2018-10-12 DIAGNOSIS — G500 Trigeminal neuralgia: Secondary | ICD-10-CM | POA: Diagnosis not present

## 2018-10-12 DIAGNOSIS — M9900 Segmental and somatic dysfunction of head region: Secondary | ICD-10-CM | POA: Diagnosis not present

## 2018-10-13 DIAGNOSIS — M26629 Arthralgia of temporomandibular joint, unspecified side: Secondary | ICD-10-CM | POA: Diagnosis not present

## 2018-10-13 DIAGNOSIS — M9900 Segmental and somatic dysfunction of head region: Secondary | ICD-10-CM | POA: Diagnosis not present

## 2018-10-13 DIAGNOSIS — M9901 Segmental and somatic dysfunction of cervical region: Secondary | ICD-10-CM | POA: Diagnosis not present

## 2018-10-13 DIAGNOSIS — G500 Trigeminal neuralgia: Secondary | ICD-10-CM | POA: Diagnosis not present

## 2018-10-29 ENCOUNTER — Encounter: Payer: Self-pay | Admitting: Family Medicine

## 2018-10-29 ENCOUNTER — Ambulatory Visit: Payer: Medicare Other | Admitting: Family Medicine

## 2018-10-29 ENCOUNTER — Other Ambulatory Visit: Payer: Self-pay

## 2018-10-29 ENCOUNTER — Encounter: Payer: Self-pay | Admitting: Obstetrics and Gynecology

## 2018-10-29 ENCOUNTER — Other Ambulatory Visit: Payer: Self-pay | Admitting: Obstetrics and Gynecology

## 2018-10-29 VITALS — BP 130/78 | HR 68 | Temp 97.7°F | Resp 17 | Ht 68.0 in | Wt 202.0 lb

## 2018-10-29 DIAGNOSIS — Z1231 Encounter for screening mammogram for malignant neoplasm of breast: Secondary | ICD-10-CM

## 2018-10-29 DIAGNOSIS — M533 Sacrococcygeal disorders, not elsewhere classified: Secondary | ICD-10-CM | POA: Diagnosis not present

## 2018-10-29 DIAGNOSIS — Z803 Family history of malignant neoplasm of breast: Secondary | ICD-10-CM | POA: Diagnosis not present

## 2018-10-29 MED ORDER — PREDNISONE 20 MG PO TABS: ORAL_TABLET | ORAL | 0 refills | Status: DC

## 2018-10-29 MED ORDER — DICLOFENAC SODIUM 1.5 % TD SOLN: TRANSDERMAL | 1 refills | Status: DC
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
## 2018-10-29 NOTE — Patient Instructions (Signed)
Start prednisone taper today.  If the solution prescribed (diclofenac) is too expensive- you can try Aspercreme or capsicin cream over the counter.  Try stretches in a couple days, do not do them if it causes more pain.  Sacroiliac Joint Dysfunction  Sacroiliac joint dysfunction is a condition that causes inflammation on one or both sides of the sacroiliac (SI) joint. The SI joint connects the lower part of the spine (sacrum) with the two upper portions of the pelvis (ilium). This condition causes deep aching or burning pain in the low back. In some cases, the pain may also spread into one or both buttocks, hips, or thighs. What are the causes? This condition may be caused by:  Pregnancy. During pregnancy, extra stress is put on the SI joints because the pelvis widens.  Injury, such as: ? Injuries from car accidents. ? Sports-related injuries. ? Work-related injuries.  Having one leg that is shorter than the other.  Conditions that affect the joints, such as: ? Rheumatoid arthritis. ? Gout. ? Psoriatic arthritis. ? Joint infection (septic arthritis). Sometimes, the cause of SI joint dysfunction is not known. What are the signs or symptoms? Symptoms of this condition include:  Aching or burning pain in the lower back. The pain may also spread to other areas, such as: ? Buttocks. ? Groin. ? Thighs.  Muscle spasms in or around the painful areas.  Increased pain when standing, walking, running, stair climbing, bending, or lifting. How is this diagnosed? This condition is diagnosed with a physical exam and medical history. During the exam, the health care provider may move one or both of your legs to different positions to check for pain. Various tests may be done to confirm the diagnosis, including:  Imaging tests to look for other causes of pain. These may include: ? MRI. ? CT scan. ? Bone scan.  Diagnostic injection. A numbing medicine is injected into the SI joint using a  needle. If your pain is temporarily improved or stopped after the injection, this can indicate that SI joint dysfunction is the problem. How is this treated? Treatment depends on the cause and severity of your condition. Treatment options may include:  Ice or heat applied to the lower back area after an injury. This may help reduce pain and muscle spasms.  Medicines to relieve pain or inflammation or to relax the muscles.  Wearing a back brace (sacroiliac brace) to help support the joint while your back is healing.  Physical therapy to increase muscle strength around the joint and flexibility at the joint. This may also involve learning proper body positions and ways of moving to relieve stress on the joint.  Direct manipulation of the SI joint.  Injections of steroid medicine into the joint to reduce pain and swelling.  Radiofrequency ablation to burn away nerves that are carrying pain messages from the joint.  Use of a device that provides electrical stimulation to help reduce pain at the joint.  Surgery to put in screws and plates that limit or prevent joint motion. This is rare. Follow these instructions at home: Medicines  Take over-the-counter and prescription medicines only as told by your health care provider.  Do not drive or use heavy machinery while taking prescription pain medicine.  If you are taking prescription pain medicine, take actions to prevent or treat constipation. Your health care provider may recommend that you: ? Drink enough fluid to keep your urine pale yellow. ? Eat foods that are high in fiber, such as  fresh fruits and vegetables, whole grains, and beans. ? Limit foods that are high in fat and processed sugars, such as fried or sweet foods. ? Take an over-the-counter or prescription medicine for constipation. If you have a brace:  Wear the brace as told by your health care provider. Remove it only as told by your health care provider.  Keep the brace  clean.  If the brace is not waterproof: ? Do not let it get wet. ? Cover it with a watertight covering when you take a bath or a shower. Managing pain, stiffness, and swelling      Icing can help with pain and swelling. Heat may help with muscle tension or spasms. Ask your health care provider if you should use ice or heat.  If directed, put ice on the affected area: ? If you have a removable brace, remove it as told by your health care provider. ? Put ice in a plastic bag. ? Place a towel between your skin and the bag. ? Leave the ice on for 20 minutes, 2-3 times a day.  If directed, apply heat to the affected area. Use the heat source that your health care provider recommends, such as a moist heat pack or a heating pad. ? Place a towel between your skin and the heat source. ? Leave the heat on for 20-30 minutes. ? Remove the heat if your skin turns bright red. This is especially important if you are unable to feel pain, heat, or cold. You may have a greater risk of getting burned. General instructions  Rest as needed. Ask your health care provider what activities are safe for you.  Return to your normal activities as told by your health care provider.  Exercise as directed by your health care provider or physical therapist.  Do not use any products that contain nicotine or tobacco, such as cigarettes and e-cigarettes. These can delay bone healing. If you need help quitting, ask your health care provider.  Keep all follow-up visits as told by your health care provider. This is important. Contact a health care provider if:  Your pain is not controlled with medicine.  You have a fever.  Your pain is getting worse. Get help right away if:  You have weakness, numbness, or tingling in your legs or feet.  You lose control of your bladder or bowel. Summary  Sacroiliac joint dysfunction is a condition that causes inflammation on one or both sides of the sacroiliac (SI)  joint.  This condition causes deep aching or burning pain in the low back. In some cases, the pain may also spread into one or both buttocks, hips, or thighs.  Treatment depends on the cause and severity of your condition. It may include medicines to reduce pain and swelling or to relax muscles. This information is not intended to replace advice given to you by your health care provider. Make sure you discuss any questions you have with your health care provider. Document Released: 06/06/2008 Document Revised: 11/04/2017 Document Reviewed: 04/20/2017 Elsevier Patient Education  2020 [employer].    Sciatica Rehab Ask your health care provider which exercises are safe for you. Do exercises exactly as told by your health care provider and adjust them as directed. It is normal to feel mild stretching, pulling, tightness, or discomfort as you do these exercises. Stop right away if you feel sudden pain or your pain gets worse. Do not begin these exercises until told by your health care provider. Stretching and  range-of-motion exercises These exercises warm up your muscles and joints and improve the movement and flexibility of your hips and back. These exercises also help to relieve pain, numbness, and tingling. Sciatic nerve glide 1. Sit in a chair with your head facing down toward your chest. Place your hands behind your back. Let your shoulders slump forward. 2. Slowly straighten one of your legs while you tilt your head back as if you are looking toward the ceiling. Only straighten your leg as far as you can without making your symptoms worse. 3. Hold this position for __________ seconds. 4. Slowly return to the starting position. 5. Repeat with your other leg. Repeat __________ times. Complete this exercise __________ times a day. Knee to chest with hip adduction and internal rotation  1. Lie on your back on a firm surface with both legs straight. 2. Bend one of your knees and move it up  toward your chest until you feel a gentle stretch in your lower back and buttock. Then, move your knee toward the shoulder that is on the opposite side from your leg. This is hip adduction and internal rotation. ? Hold your leg in this position by holding on to the front of your knee. 3. Hold this position for __________ seconds. 4. Slowly return to the starting position. 5. Repeat with your other leg. Repeat __________ times. Complete this exercise __________ times a day. Prone extension on elbows  1. Lie on your abdomen on a firm surface. A bed may be too soft for this exercise. 2. Prop yourself up on your elbows. 3. Use your arms to help lift your chest up until you feel a gentle stretch in your abdomen and your lower back. ? This will place some of your body weight on your elbows. If this is uncomfortable, try stacking pillows under your chest. ? Your hips should stay down, against the surface that you are lying on. Keep your hip and back muscles relaxed. 4. Hold this position for __________ seconds. 5. Slowly relax your upper body and return to the starting position. Repeat __________ times. Complete this exercise __________ times a day. Strengthening exercises These exercises build strength and endurance in your back. Endurance is the ability to use your muscles for a long time, even after they get tired. Pelvic tilt This exercise strengthens the muscles that lie deep in the abdomen. 1. Lie on your back on a firm surface. Bend your knees and keep your feet flat on the floor. 2. Tense your abdominal muscles. Tip your pelvis up toward the ceiling and flatten your lower back into the floor. ? To help with this exercise, you may place a small towel under your lower back and try to push your back into the towel. 3. Hold this position for __________ seconds. 4. Let your muscles relax completely before you repeat this exercise. Repeat __________ times. Complete this exercise __________ times  a day. Alternating arm and leg raises  1. Get on your hands and knees on a firm surface. If you are on a hard floor, you may want to use padding, such as an exercise mat, to cushion your knees. 2. Line up your arms and legs. Your hands should be directly below your shoulders, and your knees should be directly below your hips. 3. Lift your left leg behind you. At the same time, raise your right arm and straighten it in front of you. ? Do not lift your leg higher than your hip. ? Do not lift  your arm higher than your shoulder. ? Keep your abdominal and back muscles tight. ? Keep your hips facing the ground. ? Do not arch your back. ? Keep your balance carefully, and do not hold your breath. 4. Hold this position for __________ seconds. 5. Slowly return to the starting position. 6. Repeat with your right leg and your left arm. Repeat __________ times. Complete this exercise __________ times a day. Posture and body mechanics Good posture and healthy body mechanics can help to relieve stress in your body's tissues and joints. Body mechanics refers to the movements and positions of your body while you do your daily activities. Posture is part of body mechanics. Good posture means:  Your spine is in its natural S-curve position (neutral).  Your shoulders are pulled back slightly.  Your head is not tipped forward. Follow these guidelines to improve your posture and body mechanics in your everyday activities. Standing   When standing, keep your spine neutral and your feet about hip width apart. Keep a slight bend in your knees. Your ears, shoulders, and hips should line up.  When you do a task in which you stand in one place for a long time, place one foot up on a stable object that is 2-4 inches (5-10 cm) high, such as a footstool. This helps keep your spine neutral. Sitting   When sitting, keep your spine neutral and keep your feet flat on the floor. Use a footrest, if necessary, and keep  your thighs parallel to the floor. Avoid rounding your shoulders, and avoid tilting your head forward.  When working at a desk or a computer, keep your desk at a height where your hands are slightly lower than your elbows. Slide your chair under your desk so you are close enough to maintain good posture.  When working at a computer, place your monitor at a height where you are looking straight ahead and you do not have to tilt your head forward or downward to look at the screen. Resting  When lying down and resting, avoid positions that are most painful for you.  If you have pain with activities such as sitting, bending, stooping, or squatting, lie in a position in which your body does not bend very much. For example, avoid curling up on your side with your arms and knees near your chest (fetal position).  If you have pain with activities such as standing for a long time or reaching with your arms, lie with your spine in a neutral position and bend your knees slightly. Try the following positions: ? Lying on your side with a pillow between your knees. ? Lying on your back with a pillow under your knees. Lifting   When lifting objects, keep your feet at least shoulder width apart and tighten your abdominal muscles.  Bend your knees and hips and keep your spine neutral. It is important to lift using the strength of your legs, not your back. Do not lock your knees straight out.  Always ask for help to lift heavy or awkward objects. This information is not intended to replace advice given to you by your health care provider. Make sure you discuss any questions you have with your health care provider. Document Released: 03/10/2005 Document Revised: 07/02/2018 Document Reviewed: 04/01/2018 Elsevier Patient Education  2020 [employer].

## 2018-11-01 DIAGNOSIS — M461 Sacroiliitis, not elsewhere classified: Secondary | ICD-10-CM | POA: Diagnosis not present

## 2018-11-01 DIAGNOSIS — M26622 Arthralgia of left temporomandibular joint: Secondary | ICD-10-CM | POA: Diagnosis not present

## 2018-11-01 DIAGNOSIS — M47812 Spondylosis without myelopathy or radiculopathy, cervical region: Secondary | ICD-10-CM | POA: Diagnosis not present

## 2018-11-01 DIAGNOSIS — M26621 Arthralgia of right temporomandibular joint: Secondary | ICD-10-CM | POA: Diagnosis not present

## 2018-11-09 DIAGNOSIS — M26629 Arthralgia of temporomandibular joint, unspecified side: Secondary | ICD-10-CM | POA: Diagnosis not present

## 2018-11-09 DIAGNOSIS — M542 Cervicalgia: Secondary | ICD-10-CM | POA: Diagnosis not present

## 2018-11-09 DIAGNOSIS — M9901 Segmental and somatic dysfunction of cervical region: Secondary | ICD-10-CM | POA: Diagnosis not present

## 2018-11-09 DIAGNOSIS — M9900 Segmental and somatic dysfunction of head region: Secondary | ICD-10-CM | POA: Diagnosis not present

## 2018-11-10 ENCOUNTER — Telehealth: Payer: Self-pay | Admitting: Family Medicine

## 2018-11-10 NOTE — Telephone Encounter (Signed)
Please call patient regarding steroid meds Dr. Raoul Pitch prescribed for her last visit.  Patient can be reached at [phone].  Thank you

## 2018-11-11 NOTE — Telephone Encounter (Signed)
Pt was called and stated when she took the prednisone, started Friday night at 60mg . She did not sleep at all, had restless legs all night, and felt like she was going crazy. Pt went to pain management appt and they suggested to take a benadryl and They gave instructions to slowly come off steroid by tapering down.   For the next 3 days she is to take 20mg  daily, then take 10mg  daily for 3 days, and 2-3 days take a half a tablet (5mg  tablet).   Pt is not feeling any better as far as her hip pain. She wanted appt next week, scheduled. She was educated to call back if the taper was not working

## 2018-11-17 DIAGNOSIS — M461 Sacroiliitis, not elsewhere classified: Secondary | ICD-10-CM | POA: Diagnosis not present

## 2018-11-17 DIAGNOSIS — M545 Low back pain: Secondary | ICD-10-CM | POA: Diagnosis not present

## 2018-11-17 DIAGNOSIS — G894 Chronic pain syndrome: Secondary | ICD-10-CM | POA: Diagnosis not present

## 2018-11-18 ENCOUNTER — Ambulatory Visit: Payer: Medicare Other | Admitting: Family Medicine

## 2018-11-19 ENCOUNTER — Ambulatory Visit: Payer: Medicare Other | Admitting: Family Medicine

## 2018-11-22 ENCOUNTER — Other Ambulatory Visit: Payer: Self-pay | Admitting: Family

## 2018-11-22 DIAGNOSIS — G8929 Other chronic pain: Secondary | ICD-10-CM

## 2018-11-22 DIAGNOSIS — M545 Low back pain, unspecified: Secondary | ICD-10-CM

## 2018-11-24 ENCOUNTER — Other Ambulatory Visit: Payer: Medicare Other

## 2018-11-24 DIAGNOSIS — M461 Sacroiliitis, not elsewhere classified: Secondary | ICD-10-CM | POA: Diagnosis not present

## 2018-11-26 ENCOUNTER — Other Ambulatory Visit: Payer: Self-pay | Admitting: Family

## 2018-11-26 DIAGNOSIS — M79662 Pain in left lower leg: Secondary | ICD-10-CM | POA: Diagnosis not present

## 2018-11-26 DIAGNOSIS — M79652 Pain in left thigh: Secondary | ICD-10-CM | POA: Diagnosis not present

## 2018-11-30 ENCOUNTER — Other Ambulatory Visit: Payer: Self-pay | Admitting: *Deleted

## 2018-12-01 ENCOUNTER — Other Ambulatory Visit: Payer: Self-pay

## 2018-12-01 ENCOUNTER — Ambulatory Visit (INDEPENDENT_AMBULATORY_CARE_PROVIDER_SITE_OTHER): Payer: Medicare Other

## 2018-12-01 VITALS — BP 138/72 | HR 77 | Ht 68.0 in | Wt 205.1 lb

## 2018-12-01 DIAGNOSIS — Z0000 Encounter for general adult medical examination without abnormal findings: Secondary | ICD-10-CM | POA: Diagnosis not present

## 2018-12-01 DIAGNOSIS — E669 Obesity, unspecified: Secondary | ICD-10-CM

## 2018-12-01 NOTE — Patient Instructions (Addendum)
Hep C/HIV with next lab draw.   Flu on next office visit.   Continue doing brain stimulating activities (puzzles, reading, adult coloring books, staying active) to keep memory sharp.   Bring a copy of your living will and/or healthcare power of attorney to your next office visit.   Health Maintenance, Female Adopting a healthy lifestyle and getting preventive care are important in promoting health and wellness. Ask your health care provider about:  The right schedule for you to have regular tests and exams.  Things you can do on your own to prevent diseases and keep yourself healthy. What should I know about diet, weight, and exercise? Eat a healthy diet   Eat a diet that includes plenty of vegetables, fruits, low-fat dairy products, and lean protein.  Do not eat a lot of foods that are high in solid fats, added sugars, or sodium. Maintain a healthy weight Body mass index (BMI) is used to identify weight problems. It estimates body fat based on height and weight. Your health care provider can help determine your BMI and help you achieve or maintain a healthy weight. Get regular exercise Get regular exercise. This is one of the most important things you can do for your health. Most adults should:  Exercise for at least 150 minutes each week. The exercise should increase your heart rate and make you sweat (moderate-intensity exercise).  Do strengthening exercises at least twice a week. This is in addition to the moderate-intensity exercise.  Spend less time sitting. Even light physical activity can be beneficial. Watch cholesterol and blood lipids Have your blood tested for lipids and cholesterol at 64 years of age, then have this test every 5 years. Have your cholesterol levels checked more often if:  Your lipid or cholesterol levels are high.  You are older than 64 years of age.  You are at high risk for heart disease. What should I know about cancer screening? Depending on  your health history and family history, you may need to have cancer screening at various ages. This may include screening for:  Breast cancer.  Cervical cancer.  Colorectal cancer.  Skin cancer.  Lung cancer. What should I know about heart disease, diabetes, and high blood pressure? Blood pressure and heart disease  High blood pressure causes heart disease and increases the risk of stroke. This is more likely to develop in people who have high blood pressure readings, are of African descent, or are overweight.  Have your blood pressure checked: ? Every 3-5 years if you are 51-49 years of age. ? Every year if you are 41 years old or older. Diabetes Have regular diabetes screenings. This checks your fasting blood sugar level. Have the screening done:  Once every three years after age 62 if you are at a normal weight and have a low risk for diabetes.  More often and at a younger age if you are overweight or have a high risk for diabetes. What should I know about preventing infection? Hepatitis B If you have a higher risk for hepatitis B, you should be screened for this virus. Talk with your health care provider to find out if you are at risk for hepatitis B infection. Hepatitis C Testing is recommended for:  Everyone born from [birth year] through 1966.  Anyone with known risk factors for hepatitis C. Sexually transmitted infections (STIs)  Get screened for STIs, including gonorrhea and chlamydia, if: ? You are sexually active and are younger than 64 years of age. ?  You are older than 64 years of age and your health care provider tells you that you are at risk for this type of infection. ? Your sexual activity has changed since you were last screened, and you are at increased risk for chlamydia or gonorrhea. Ask your health care provider if you are at risk.  Ask your health care provider about whether you are at high risk for HIV. Your health care provider may recommend a prescription  medicine to help prevent HIV infection. If you choose to take medicine to prevent HIV, you should first get tested for HIV. You should then be tested every 3 months for as long as you are taking the medicine. Pregnancy  If you are about to stop having your period (premenopausal) and you may become pregnant, seek counseling before you get pregnant.  Take 400 to 800 micrograms (mcg) of folic acid every day if you become pregnant.  Ask for birth control (contraception) if you want to prevent pregnancy. Osteoporosis and menopause Osteoporosis is a disease in which the bones lose minerals and strength with aging. This can result in bone fractures. If you are 6 years old or older, or if you are at risk for osteoporosis and fractures, ask your health care provider if you should:  Be screened for bone loss.  Take a calcium or vitamin D supplement to lower your risk of fractures.  Be given hormone replacement therapy (HRT) to treat symptoms of menopause. Follow these instructions at home: Lifestyle  Do not use any products that contain nicotine or tobacco, such as cigarettes, e-cigarettes, and chewing tobacco. If you need help quitting, ask your health care provider.  Do not use street drugs.  Do not share needles.  Ask your health care provider for help if you need support or information about quitting drugs. Alcohol use  Do not drink alcohol if: ? Your health care provider tells you not to drink. ? You are pregnant, may be pregnant, or are planning to become pregnant.  If you drink alcohol: ? Limit how much you use to 0-1 drink a day. ? Limit intake if you are breastfeeding.  Be aware of how much alcohol is in your drink. In the [place] one drink equals one 12 oz bottle of beer (355 mL), one 5 oz glass of wine (148 mL), or one 1 oz glass of hard liquor (44 mL). General instructions  Schedule regular health, dental, and eye exams.  Stay current with your vaccines.  Tell your health  care provider if: ? You often feel depressed. ? You have ever been abused or do not feel safe at home. Summary  Adopting a healthy lifestyle and getting preventive care are important in promoting health and wellness.  Follow your health care provider's instructions about healthy diet, exercising, and getting tested or screened for diseases.  Follow your health care provider's instructions on monitoring your cholesterol and blood pressure. This information is not intended to replace advice given to you by your health care provider. Make sure you discuss any questions you have with your health care provider. Document Released: 09/23/2010 Document Revised: 03/03/2018 Document Reviewed: 03/03/2018 Elsevier Patient Education  2020 [employer].
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
## 2018-12-03 ENCOUNTER — Ambulatory Visit: Payer: Medicare Other

## 2018-12-04 ENCOUNTER — Other Ambulatory Visit: Payer: Self-pay

## 2018-12-04 ENCOUNTER — Ambulatory Visit
Admission: RE | Admit: 2018-12-04 | Discharge: 2018-12-04 | Disposition: A | Discharge status: Home / Self Care | Payer: Medicare Other | Source: Ambulatory Visit | Attending: Family | Admitting: Family

## 2018-12-04 DIAGNOSIS — M5127 Other intervertebral disc displacement, lumbosacral region: Secondary | ICD-10-CM | POA: Diagnosis not present

## 2018-12-04 DIAGNOSIS — G8929 Other chronic pain: Secondary | ICD-10-CM

## 2018-12-06 ENCOUNTER — Other Ambulatory Visit: Payer: Self-pay | Admitting: Primary Care

## 2018-12-06 ENCOUNTER — Other Ambulatory Visit: Payer: Self-pay | Admitting: *Deleted

## 2018-12-06 ENCOUNTER — Other Ambulatory Visit: Payer: Self-pay

## 2018-12-06 DIAGNOSIS — M79662 Pain in left lower leg: Secondary | ICD-10-CM

## 2018-12-08 DIAGNOSIS — M47816 Spondylosis without myelopathy or radiculopathy, lumbar region: Secondary | ICD-10-CM | POA: Diagnosis not present

## 2018-12-08 DIAGNOSIS — M5136 Other intervertebral disc degeneration, lumbar region: Secondary | ICD-10-CM | POA: Diagnosis not present

## 2018-12-11 ENCOUNTER — Other Ambulatory Visit: Payer: Medicare Other

## 2018-12-17 DIAGNOSIS — M7918 Myalgia, other site: Secondary | ICD-10-CM | POA: Diagnosis not present

## 2018-12-17 DIAGNOSIS — M9901 Segmental and somatic dysfunction of cervical region: Secondary | ICD-10-CM | POA: Diagnosis not present

## 2018-12-17 DIAGNOSIS — G500 Trigeminal neuralgia: Secondary | ICD-10-CM | POA: Diagnosis not present

## 2018-12-17 DIAGNOSIS — M9900 Segmental and somatic dysfunction of head region: Secondary | ICD-10-CM | POA: Diagnosis not present

## 2018-12-23 DIAGNOSIS — M5020 Other cervical disc displacement, unspecified cervical region: Secondary | ICD-10-CM | POA: Insufficient documentation

## 2018-12-23 DIAGNOSIS — M5127 Other intervertebral disc displacement, lumbosacral region: Secondary | ICD-10-CM | POA: Diagnosis not present

## 2018-12-28 ENCOUNTER — Ambulatory Visit: Payer: Medicare Other | Admitting: Obstetrics and Gynecology

## 2018-12-28 ENCOUNTER — Other Ambulatory Visit: Payer: Self-pay

## 2018-12-28 NOTE — Telephone Encounter (Signed)
Medication refill request: Estradiol Last AEX:  11/26/2017 BS Next AEX: 02/04/2019 Last MMG (if hormonal medication request): 10/29/2018 BIRADS 1 Negative Density C Refill authorized: Pending #12 with 1 refill if appropriate. Please advise.   Medication refill request: Premarin Last AEX:  11/26/2017 BS Next AEX: 02/04/2019 Last MMG (if hormonal medication request): 10/29/2018 BIRADS 1 Negative Density C Refill authorized: Pending #30 grams with one refill if appropriate. Please advise.

## 2018-12-29 MED ORDER — ESTROGENS, CONJUGATED 0.625 MG/GM VA CREA: TOPICAL_CREAM | VAGINAL | 0 refills | Status: DC

## 2018-12-29 MED ORDER — ESTRADIOL 0.05 MG/24HR TD PTWK: 0.0500 mg | MEDICATED_PATCH | TRANSDERMAL | 0 refills | Status: DC

## 2018-12-31 DIAGNOSIS — M47816 Spondylosis without myelopathy or radiculopathy, lumbar region: Secondary | ICD-10-CM | POA: Diagnosis not present

## 2018-12-31 DIAGNOSIS — M5136 Other intervertebral disc degeneration, lumbar region: Secondary | ICD-10-CM | POA: Diagnosis not present

## 2018-12-31 DIAGNOSIS — G894 Chronic pain syndrome: Secondary | ICD-10-CM | POA: Diagnosis not present

## 2019-01-07 ENCOUNTER — Encounter: Payer: Self-pay | Admitting: Family Medicine

## 2019-01-07 ENCOUNTER — Other Ambulatory Visit: Payer: Self-pay

## 2019-01-07 ENCOUNTER — Ambulatory Visit (INDEPENDENT_AMBULATORY_CARE_PROVIDER_SITE_OTHER): Payer: Medicare Other | Admitting: Family Medicine

## 2019-01-07 VITALS — BP 131/77 | HR 83 | Temp 97.8°F | Resp 18 | Ht 68.0 in | Wt 199.0 lb

## 2019-01-07 DIAGNOSIS — E2839 Other primary ovarian failure: Secondary | ICD-10-CM

## 2019-01-07 DIAGNOSIS — Z130 Encounter for screening for diseases of the blood and blood-forming organs and certain disorders involving the immune mechanism: Secondary | ICD-10-CM | POA: Diagnosis not present

## 2019-01-07 DIAGNOSIS — Z23 Encounter for immunization: Secondary | ICD-10-CM

## 2019-01-07 DIAGNOSIS — E669 Obesity, unspecified: Secondary | ICD-10-CM

## 2019-01-07 DIAGNOSIS — Z1159 Encounter for screening for other viral diseases: Secondary | ICD-10-CM

## 2019-01-07 DIAGNOSIS — Z0000 Encounter for general adult medical examination without abnormal findings: Secondary | ICD-10-CM

## 2019-01-07 DIAGNOSIS — Z131 Encounter for screening for diabetes mellitus: Secondary | ICD-10-CM | POA: Diagnosis not present

## 2019-01-07 DIAGNOSIS — Z79890 Hormone replacement therapy (postmenopausal): Secondary | ICD-10-CM | POA: Diagnosis not present

## 2019-01-07 DIAGNOSIS — Z79899 Other long term (current) drug therapy: Secondary | ICD-10-CM

## 2019-01-07 DIAGNOSIS — Z114 Encounter for screening for human immunodeficiency virus [HIV]: Secondary | ICD-10-CM

## 2019-01-07 LAB — TSH: TSH: 1.05 u[IU]/mL (ref 0.35–4.50)

## 2019-01-07 LAB — COMPREHENSIVE METABOLIC PANEL
ALT: 11 U/L (ref 0–35)
AST: 15 U/L (ref 0–37)
Albumin: 4.6 g/dL (ref 3.5–5.2)
Alkaline Phosphatase: 68 U/L (ref 39–117)
BUN: 16 mg/dL (ref 6–23)
CO2: 26 mEq/L (ref 19–32)
Calcium: 9.2 mg/dL (ref 8.4–10.5)
Chloride: 105 mEq/L (ref 96–112)
Creatinine, Ser: 0.85 mg/dL (ref 0.40–1.20)
GFR: 67.22 mL/min (ref >60.00)
Glucose, Bld: 99 mg/dL (ref 70–99)
Potassium: 4 mEq/L (ref 3.5–5.1)
Sodium: 139 mEq/L (ref 135–145)
Total Bilirubin: 0.5 mg/dL (ref 0.2–1.2)
Total Protein: 7.1 g/dL (ref 6.0–8.3)

## 2019-01-07 LAB — CBC
HCT: 45.1 % (ref 36.0–46.0)
Hemoglobin: 15.1 g/dL — ABNORMAL HIGH (ref 12.0–15.0)
MCHC: 33.5 g/dL (ref 30.0–36.0)
MCV: 89.5 fl (ref 78.0–100.0)
Platelets: 208 10*3/uL (ref 150.0–400.0)
RBC: 5.04 Mil/uL (ref 3.87–5.11)
RDW: 12.7 % (ref 11.5–15.5)
WBC: 5.6 10*3/uL (ref 4.0–10.5)

## 2019-01-07 LAB — LIPID PANEL
Cholesterol: 177 mg/dL (ref 0–200)
HDL: 41.3 mg/dL (ref >39.00)
NonHDL: 135.85
Total CHOL/HDL Ratio: 4
Triglycerides: 212 mg/dL — ABNORMAL HIGH (ref 0.0–149.0)
VLDL: 42.4 mg/dL — ABNORMAL HIGH (ref 0.0–40.0)

## 2019-01-07 LAB — LDL CHOLESTEROL, DIRECT: Direct LDL: 99 mg/dL

## 2019-01-07 LAB — HEMOGLOBIN A1C: Hgb A1c MFr Bld: 5.7 % (ref 4.6–6.5)

## 2019-01-07 MED ORDER — ZOSTER VAC RECOMB ADJUVANTED 50 MCG/0.5ML IM SUSR: INTRAMUSCULAR | 1 refills | Status: DC

## 2019-01-07 NOTE — Patient Instructions (Signed)
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
## 2019-01-10 LAB — HEPATITIS C ANTIBODY
Hepatitis C Ab: NONREACTIVE
SIGNAL TO CUT-OFF: 0.01 (ref <1.00)

## 2019-01-10 LAB — HIV ANTIBODY (ROUTINE TESTING W REFLEX): HIV 1&2 Ab, 4th Generation: NONREACTIVE

## 2019-01-12 DIAGNOSIS — M7918 Myalgia, other site: Secondary | ICD-10-CM | POA: Diagnosis not present

## 2019-01-12 DIAGNOSIS — M9900 Segmental and somatic dysfunction of head region: Secondary | ICD-10-CM | POA: Diagnosis not present

## 2019-01-12 DIAGNOSIS — M26629 Arthralgia of temporomandibular joint, unspecified side: Secondary | ICD-10-CM | POA: Diagnosis not present

## 2019-01-12 DIAGNOSIS — M9901 Segmental and somatic dysfunction of cervical region: Secondary | ICD-10-CM | POA: Diagnosis not present

## 2019-01-12 DIAGNOSIS — G500 Trigeminal neuralgia: Secondary | ICD-10-CM | POA: Diagnosis not present

## 2019-01-12 DIAGNOSIS — M9907 Segmental and somatic dysfunction of upper extremity: Secondary | ICD-10-CM | POA: Diagnosis not present

## 2019-01-12 DIAGNOSIS — M9905 Segmental and somatic dysfunction of pelvic region: Secondary | ICD-10-CM | POA: Diagnosis not present

## 2019-01-12 DIAGNOSIS — M542 Cervicalgia: Secondary | ICD-10-CM | POA: Diagnosis not present

## 2019-01-24 DIAGNOSIS — M5417 Radiculopathy, lumbosacral region: Secondary | ICD-10-CM | POA: Diagnosis not present

## 2019-01-24 DIAGNOSIS — M5136 Other intervertebral disc degeneration, lumbar region: Secondary | ICD-10-CM | POA: Diagnosis not present

## 2019-01-24 DIAGNOSIS — M5126 Other intervertebral disc displacement, lumbar region: Secondary | ICD-10-CM | POA: Diagnosis not present

## 2019-01-24 DIAGNOSIS — M47816 Spondylosis without myelopathy or radiculopathy, lumbar region: Secondary | ICD-10-CM | POA: Diagnosis not present

## 2019-02-02 DIAGNOSIS — M5417 Radiculopathy, lumbosacral region: Secondary | ICD-10-CM | POA: Diagnosis not present

## 2019-02-02 DIAGNOSIS — M5126 Other intervertebral disc displacement, lumbar region: Secondary | ICD-10-CM | POA: Diagnosis not present

## 2019-02-02 DIAGNOSIS — M5136 Other intervertebral disc degeneration, lumbar region: Secondary | ICD-10-CM | POA: Diagnosis not present

## 2019-02-04 ENCOUNTER — Ambulatory Visit: Payer: Medicare Other | Admitting: Obstetrics and Gynecology

## 2019-02-08 DIAGNOSIS — G500 Trigeminal neuralgia: Secondary | ICD-10-CM | POA: Diagnosis not present

## 2019-02-08 DIAGNOSIS — M9901 Segmental and somatic dysfunction of cervical region: Secondary | ICD-10-CM | POA: Diagnosis not present

## 2019-02-08 DIAGNOSIS — M7918 Myalgia, other site: Secondary | ICD-10-CM | POA: Diagnosis not present

## 2019-02-08 DIAGNOSIS — M9900 Segmental and somatic dysfunction of head region: Secondary | ICD-10-CM | POA: Diagnosis not present

## 2019-02-10 ENCOUNTER — Other Ambulatory Visit: Payer: Self-pay

## 2019-02-14 ENCOUNTER — Encounter: Payer: Self-pay | Admitting: Certified Nurse Midwife

## 2019-02-14 ENCOUNTER — Ambulatory Visit (INDEPENDENT_AMBULATORY_CARE_PROVIDER_SITE_OTHER): Payer: Medicare Other | Admitting: Certified Nurse Midwife

## 2019-02-14 ENCOUNTER — Other Ambulatory Visit: Payer: Self-pay

## 2019-02-14 VITALS — BP 120/78 | HR 68 | Temp 97.1°F | Resp 16 | Ht 68.25 in | Wt 197.0 lb

## 2019-02-14 DIAGNOSIS — N951 Menopausal and female climacteric states: Secondary | ICD-10-CM | POA: Diagnosis not present

## 2019-02-14 DIAGNOSIS — N812 Incomplete uterovaginal prolapse: Secondary | ICD-10-CM

## 2019-02-14 DIAGNOSIS — Z01419 Encounter for gynecological examination (general) (routine) without abnormal findings: Secondary | ICD-10-CM

## 2019-02-14 DIAGNOSIS — N952 Postmenopausal atrophic vaginitis: Secondary | ICD-10-CM | POA: Diagnosis not present

## 2019-02-14 MED ORDER — ESTROGENS, CONJUGATED 0.625 MG/GM VA CREA: TOPICAL_CREAM | VAGINAL | 2 refills | Status: DC

## 2019-02-14 MED ORDER — ESTRADIOL 0.05 MG/24HR TD PTWK: 0.0500 mg | MEDICATED_PATCH | TRANSDERMAL | 3 refills | Status: DC

## 2019-02-14 NOTE — Progress Notes (Signed)
63 y.o. [id number] Married  Caucasian Fe here for annual exam. Post menopausal, no vaginal dryness issues. Using Premarin cream for dryness and prolapse. Patient continues with Climara dose patch, for quality of life issues. Sees Dr. Raoul Pitch for aex and labs. Sees Neurology for migraine headaches with Imitrex. Having some issues in back under neurology for low back spasms and nerve issues. Battling Fibromyalgia and feels she is doing better. No other health issues today.  No LMP recorded (lmp unknown). Patient has had a hysterectomy.          Sexually active: Yes.    The current method of family planning is status post hysterectomy.    Exercising: No.  exercise Smoker:  no  Review of Systems  Constitutional: Negative.   HENT: Negative.   Eyes: Negative.   Respiratory: Negative.   Cardiovascular: Negative.   Gastrointestinal: Negative.   Genitourinary: Negative.   Musculoskeletal: Negative.   Skin: Negative.   Neurological: Negative.   Endo/Heme/Allergies: Negative.   Psychiatric/Behavioral: Negative.     Health Maintenance: Pap:  2015 neg per patient hysterectomy History of Abnormal Pap: yes,cryo MMG:  10-29-2018 category c density birads 1:neg Self Breast exams: occ Colonoscopy:  Done in her 50's, ifob done couple yrs ago-neg per patient BMD:   2010 neg TDaP:  2015 Shingles: not done Pneumonia: not done Hep C and HIV: both neg 2020 Labs: with PCP   reports that she has never smoked. She has never used smokeless tobacco. She reports that she does not drink alcohol or use drugs.  Past Medical History:  Diagnosis Date  . Abnormal uterine bleeding    prior to hysterectomy  . Allergy   . Anemia    prior to hysterectomy  . Anxiety   . Arthritis    right hip  . Basal cell carcinoma 2015   Right shin.   Marland Kitchen BCC (basal cell carcinoma of skin)   . Cervical pain   . Complex regional pain syndrome   . Depression   . Epidemic cervical myalgia   . Family history of breast cancer    . Fibroid    reason for Hysterectomy  . Genetic testing 01/08/2017   Common Cancers panel (47 genes) @ [facility] - No pathogenic mutation detected  . Heart murmur    Grade I  . History of abnormal cervical Pap smear 1989   --hx colpo/cryotherapy to cervix   . 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 Hx of migraines   . Neuromuscular disorder (HCC)    fibromyalgia, myofasical pain  . Occipital neuralgia   . Tic douloureux   . TMJ (dislocation of temporomandibular joint)   . Trigeminal neuralgia   . Urinary incontinence   . Vaginal prolapse 2016   cystocele and rectocele    Past Surgical History:  Procedure Laterality Date  . APPENDECTOMY  1972  . CHOLECYSTECTOMY  1995  . CRANIECTOMY  2010   due to [facility]eminal neuralgia at [facility]  . URETHRAL DILATION  1974  . URETHRAL DIVERTICULUM REPAIR  1992  . VAGINAL HYSTERECTOMY  1997    Current Outpatient Medications  Medication Sig Dispense Refill  . ALPRAZolam (XANAX) 1 MG tablet Takes 1/2 to 1 tablet up to 3 times a day  0  . b complex vitamins tablet Take 1 tablet by mouth daily.    . Cholecalciferol (D3 SUPER STRENGTH) 2000 units CAPS Take 2,000 Units by mouth daily.    Marland Kitchen  conjugated estrogens (PREMARIN) vaginal cream Place 1/2 gram vaginally at bedtime three times a week. 30 g 0  . Diclofenac Sodium 1.5 % SOLN Apply topically BID as needed to affected 150 mL 1  . diphenhydrAMINE (BENADRYL) 25 MG tablet Take 25 mg by mouth as needed.    . docusate sodium (COLACE) 100 MG capsule Take 100 mg by mouth 2 (two) times daily.    Marland Kitchen estradiol (CLIMARA - DOSED IN MG/24 HR) 0.05 mg/24hr patch Place 1 patch (0.05 mg total) onto the skin once a week. 12 patch 0  . famotidine (PEPCID) 10 MG tablet Take 10 mg by mouth 2 (two) times daily.    . fexofenadine (ALLEGRA) 180 MG tablet Take 180 mg by mouth daily.    . fluticasone (FLONASE) 50 MCG/ACT nasal spray Place into both nostrils daily.    Marland Kitchen L-Theanine  100 MG CAPS Take 2 capsules by mouth daily.    . magnesium 30 MG tablet Take 125 mg by mouth 2 (two) times daily.     . Misc Natural Products (TART CHERRY ADVANCED PO) Take by mouth.    . Multiple Vitamins-Minerals (HAIR SKIN AND NAILS FORMULA PO) Take 1 tablet by mouth daily.    Marland Kitchen NARCAN 4 MG/0.1ML LIQD nasal spray kit     . NON FORMULARY 15 mg. Hempworks 500 - 61m hemp oil 7513m   . NON FORMULARY 1 tablet daily.    . ondansetron (ZOFRAN-ODT) 4 MG disintegrating tablet Take 4 mg by mouth every 4 (four) hours as needed for nausea or vomiting.     . Marland KitchenxyCODONE (ROXICODONE) 15 MG immediate release tablet Take 1 tablet by mouth every 4 (four) hours.  0  . OXYCONTIN 15 MG 12 hr tablet Take 45 mg by mouth.   0  . SUMAtriptan (IMITREX) 50 MG tablet as needed.   0  . tiZANidine (ZANAFLEX) 4 MG tablet     . zolpidem (AMBIEN) 5 MG tablet Take 5 mg by mouth at bedtime.  0  . Zoster Vaccine Adjuvanted ([facility]ack[facility]mc Mountainsideinjection IM administration once, then Repeat dose in 2-6 months once. 0.5 mL 1   No current facility-administered medications for this visit.     Family History  Problem Relation Age of Onset  . Breast cancer Mother 7142     TAH/BSO in late 3073s. Diabetes Mellitus II Mother   . Diabetes Mother 7246     Type 2  . Hyperlipidemia Mother   . Aplastic anemia Brother 1183     passed away age 5718ith Leukemia  . Heart disease Father        Dec 72 CHF  . Alcoholism Father   . Hypertension Father   . Stroke Maternal Grandmother        dec age 64. Alcoholism Sister   . Migraines Sister   . Testicular cancer Son 1841. Cancer Paternal Uncle 8048     "Spinal"    ROS:  Pertinent items are noted in HPI.  Otherwise, a comprehensive ROS was negative.  Exam:   LMP  (LMP Unknown)    Ht Readings from Last 3 Encounters:  01/07/19 '5\' 8"'  (1.727 m)  12/01/18 '5\' 8"'  (1.727 m)  10/29/18 '5\' 8"'  (1.727 m)    General appearance: alert, cooperative and appears stated age Head: Normocephalic,  without obvious abnormality, atraumatic Neck: no adenopathy, supple, symmetrical, trachea midline and thyroid normal to inspection and palpation  Lungs: clear to auscultation bilaterally Breasts: normal appearance, no masses or tenderness, No nipple retraction or dimpling, No nipple discharge or bleeding, No axillary or supraclavicular adenopathy Heart: regular rate and rhythm Abdomen: soft, non-tender; no masses,  no organomegaly Extremities: extremities normal, atraumatic, no cyanosis or edema Skin: Skin color, texture, turgor normal. No rashes or lesions Lymph nodes: Cervical, supraclavicular, and axillary nodes normal. No abnormal inguinal nodes palpated Neurologic: Grossly normal   Pelvic: External genitalia:  no lesions              Urethra:  normal appearing urethra with no masses, tenderness or lesions              Bartholin's and Skene's: normal                 Vagina: normal appearing vagina with normal color and discharge, no lesions, cystocele/rectocele noted              Cervix: absent              Pap taken: No. Bimanual Exam:  Uterus:  uterus absent              Adnexa: no mass, fullness, tenderness               Rectovaginal: Confirms               Anus:  normal sphincter tone, no lesions  Chaperone present: yes  A:  Well Woman with normal exam  Menopausal on HRT desires continuance for vaginal tissues and quality life with hot flashes.   Cystocele/rectocele no change  Fibromyalgia, headaches, back issues currently with neurology management.  PCP management of aex and labs, Vitamin D deficiency    P:   Reviewed health and wellness pertinent to exam  Discussed risks/benefits/warning signs of HRT and premarin use. She has to have current mammogram to continue use.  Rx Premarin cream see instructions   Rx Climara patch see instructions for use  Advise if any changes in prolapse.  Continue follow up with MDs as indicated.  Pap smear: no   counseled on breast self  exam, mammography screening needed  yearly, feminine hygiene, menopause, adequate intake of calcium and vitamin D, diet and exercise, Kegel's exercises  return annually or prn  An After Visit Summary was printed and given to the patient.

## 2019-02-14 NOTE — Patient Instructions (Signed)
EXERCISE AND DIET:  We recommended that you start or continue a regular exercise program for good health. Regular exercise means any activity that makes your heart beat faster and makes you sweat.  We recommend exercising at least 30 minutes per day at least 3 days a week, preferably 4 or 5.  We also recommend a diet low in fat and sugar.  Inactivity, poor dietary choices and obesity can cause diabetes, heart attack, stroke, and kidney damage, among others.   ° °ALCOHOL AND SMOKING:  Women should limit their alcohol intake to no more than 7 drinks/beers/glasses of wine (combined, not each!) per week. Moderation of alcohol intake to this level decreases your risk of breast cancer and liver damage. And of course, no recreational drugs are part of a healthy lifestyle.  And absolutely no smoking or even second hand smoke. Most people know smoking can cause heart and lung diseases, but did you know it also contributes to weakening of your bones? Aging of your skin?  Yellowing of your teeth and nails? ° °CALCIUM AND VITAMIN D:  Adequate intake of calcium and Vitamin D are recommended.  The recommendations for exact amounts of these supplements seem to change often, but generally speaking 600 mg of calcium (either carbonate or citrate) and 800 units of Vitamin D per day seems prudent. Certain women may benefit from higher intake of Vitamin D.  If you are among these women, your doctor will have told you during your visit.   ° °PAP SMEARS:  Pap smears, to check for cervical cancer or precancers,  have traditionally been done yearly, although recent scientific advances have shown that most women can have pap smears less often.  However, every woman still should have a physical exam from her gynecologist every year. It will include a breast check, inspection of the vulva and vagina to check for abnormal growths or skin changes, a visual exam of the cervix, and then an exam to evaluate the size and shape of the uterus and  ovaries.  And after 64 years of age, a rectal exam is indicated to check for rectal cancers. We will also provide age appropriate advice regarding health maintenance, like when you should have certain vaccines, screening for sexually transmitted diseases, bone density testing, colonoscopy, mammograms, etc.  ° °MAMMOGRAMS:  All women over 40 years old should have a yearly mammogram. Many facilities now offer a "3D" mammogram, which may cost around $50 extra out of pocket. If possible,  we recommend you accept the option to have the 3D mammogram performed.  It both reduces the number of women who will be called back for extra views which then turn out to be normal, and it is better than the routine mammogram at detecting truly abnormal areas.   ° °COLONOSCOPY:  Colonoscopy to screen for colon cancer is recommended for all women at age 50.  We know, you hate the idea of the prep.  We agree, BUT, having colon cancer and not knowing it is worse!!  Colon cancer so often starts as a polyp that can be seen and removed at colonscopy, which can quite literally save your life!  And if your first colonoscopy is normal and you have no family history of colon cancer, most women don't have to have it again for 10 years.  Once every ten years, you can do something that may end up saving your life, right?  We will be happy to help you get it scheduled when you are ready.    Be sure to check your insurance coverage so you understand how much it will cost.  It may be covered as a preventative service at no cost, but you should check your particular policy.      Menopause and Hormone Replacement Therapy Menopause is a normal time of life when menstrual periods stop completely and the ovaries stop producing the female hormones estrogen and progesterone. This lack of hormones can affect your health and cause undesirable symptoms. Hormone replacement therapy (HRT) can relieve some of those symptoms. What is hormone replacement  therapy? HRT is the use of artificial (synthetic) hormones to replace hormones that your body has stopped producing because you have reached menopause. What are my options for HRT?  HRT may consist of the synthetic hormones estrogen and progestin, or it may consist of only estrogen (estrogen-only therapy). You and your health care provider will decide which form of HRT is best for you. If you choose to be on HRT and you have a uterus, estrogen and progestin are usually prescribed. Estrogen-only therapy is used for women who do not have a uterus. Possible options for taking HRT include:  Pills.  Patches.  Gels.  Sprays.  Vaginal cream.  Vaginal rings.  Vaginal inserts. The amount of hormone(s) that you take and how long you take the hormone(s) varies according to your health. It is important to:  Begin HRT with the lowest possible dosage.  Stop HRT as soon as your health care provider tells you to stop.  Work with your health care provider so that you feel informed and comfortable with your decisions. What are the benefits of HRT? HRT can reduce the frequency and severity of menopausal symptoms. Benefits of HRT vary according to the kind of symptoms that you have, how severe they are, and your overall health. HRT may help to improve the following symptoms of menopause:  Hot flashes and night sweats. These are sudden feelings of heat that spread over the face and body. The skin may turn red, like a blush. Night sweats are hot flashes that happen while you are sleeping or trying to sleep.  Bone loss (osteoporosis). The body loses calcium more quickly after menopause, causing the bones to become weaker. This can increase the risk for bone breaks (fractures).  Vaginal dryness. The lining of the vagina can become thin and dry, which can cause pain during sex or cause infection, burning, or itching.  Urinary tract infections.  Urinary incontinence. This is the inability to control  when you pass urine.  Irritability.  Short-term memory problems. What are the risks of HRT? Risks of HRT vary depending on your individual health and medical history. Risks of HRT also depend on whether you receive both estrogen and progestin or you receive estrogen only. HRT may increase the risk of:  Spotting. This is when a small amount of blood leaks from the vagina unexpectedly.  Endometrial cancer. This cancer is in the lining of the uterus (endometrium).  Breast cancer.  Increased density of breast tissue. This can make it harder to find breast cancer on a breast X-ray (mammogram).  Stroke.  Heart disease.  Blood clots.  Gallbladder disease.  Liver disease. Risks of HRT can increase if you have any of the following conditions:  Endometrial cancer.  Liver disease.  Heart disease.  Breast cancer.  History of blood clots.  History of stroke. Follow these instructions at home:  Take over-the-counter and prescription medicines only as told by your health care provider.  Get mammograms, pelvic exams, and medical checkups as often as told by your health care provider.  Have Pap tests done as often as told by your health care provider. A Pap test is sometimes called a Pap smear. It is a screening test that is used to check for signs of cancer of the cervix and vagina. A Pap test can also identify the presence of infection or precancerous changes. Pap tests may be done: ? Every 3 years, starting at age 101. ? Every 5 years, starting after age 66, in combination with testing for human papillomavirus (HPV). ? More often or less often depending on other medical conditions you have, your age, and other risk factors.  It is up to you to get the results of your Pap test. Ask your health care provider, or the department that is doing the test, when your results will be ready.  Keep all follow-up visits as told by your health care provider. This is important. Contact a health  care provider if you have:  Pain or swelling in your legs.  Shortness of breath.  Chest pain.  Lumps or changes in your breasts or armpits.  Slurred speech.  Pain, burning, or bleeding when you urinate.  Unusual vaginal bleeding.  Dizziness or headaches.  Weakness or numbness in any part of your arms or legs.  Pain in your abdomen. Summary  Menopause is a normal time of life when menstrual periods stop completely and the ovaries stop producing the female hormones estrogen and progesterone.  Hormone replacement therapy (HRT) can relieve some of the symptoms of menopause.  HRT can reduce the frequency and severity of menopausal symptoms.  Risks of HRT vary depending on your individual health and medical history. This information is not intended to replace advice given to you by your health care provider. Make sure you discuss any questions you have with your health care provider. Document Released: 12/07/2002 Document Revised: 11/10/2017 Document Reviewed: 11/10/2017 Elsevier Patient Education  2020 [employer].

## 2019-02-24 DIAGNOSIS — M5417 Radiculopathy, lumbosacral region: Secondary | ICD-10-CM | POA: Diagnosis not present

## 2019-02-24 DIAGNOSIS — M5136 Other intervertebral disc degeneration, lumbar region: Secondary | ICD-10-CM | POA: Diagnosis not present

## 2019-02-24 DIAGNOSIS — M5126 Other intervertebral disc displacement, lumbar region: Secondary | ICD-10-CM | POA: Diagnosis not present

## 2019-03-02 DIAGNOSIS — M5417 Radiculopathy, lumbosacral region: Secondary | ICD-10-CM | POA: Diagnosis not present

## 2019-03-02 DIAGNOSIS — M5136 Other intervertebral disc degeneration, lumbar region: Secondary | ICD-10-CM | POA: Diagnosis not present

## 2019-03-02 DIAGNOSIS — M5126 Other intervertebral disc displacement, lumbar region: Secondary | ICD-10-CM | POA: Diagnosis not present

## 2019-03-08 DIAGNOSIS — M9901 Segmental and somatic dysfunction of cervical region: Secondary | ICD-10-CM | POA: Diagnosis not present

## 2019-03-08 DIAGNOSIS — M7918 Myalgia, other site: Secondary | ICD-10-CM | POA: Diagnosis not present

## 2019-03-08 DIAGNOSIS — R0781 Pleurodynia: Secondary | ICD-10-CM | POA: Diagnosis not present

## 2019-03-08 DIAGNOSIS — M9902 Segmental and somatic dysfunction of thoracic region: Secondary | ICD-10-CM | POA: Diagnosis not present

## 2019-03-08 DIAGNOSIS — M9907 Segmental and somatic dysfunction of upper extremity: Secondary | ICD-10-CM | POA: Diagnosis not present

## 2019-03-08 DIAGNOSIS — G5643 Causalgia of bilateral upper limbs: Secondary | ICD-10-CM | POA: Diagnosis not present

## 2019-03-08 DIAGNOSIS — M9908 Segmental and somatic dysfunction of rib cage: Secondary | ICD-10-CM | POA: Diagnosis not present

## 2019-03-08 DIAGNOSIS — M546 Pain in thoracic spine: Secondary | ICD-10-CM | POA: Diagnosis not present

## 2019-03-25 HISTORY — PX: LUMBAR DISC SURGERY: SHX700

## 2019-03-31 DIAGNOSIS — M5417 Radiculopathy, lumbosacral region: Secondary | ICD-10-CM | POA: Diagnosis not present

## 2019-03-31 DIAGNOSIS — M62838 Other muscle spasm: Secondary | ICD-10-CM | POA: Diagnosis not present

## 2019-03-31 DIAGNOSIS — M5136 Other intervertebral disc degeneration, lumbar region: Secondary | ICD-10-CM | POA: Diagnosis not present

## 2019-03-31 DIAGNOSIS — M5481 Occipital neuralgia: Secondary | ICD-10-CM | POA: Diagnosis not present

## 2019-04-05 DIAGNOSIS — G5702 Lesion of sciatic nerve, left lower limb: Secondary | ICD-10-CM | POA: Diagnosis not present

## 2019-04-05 DIAGNOSIS — M7632 Iliotibial band syndrome, left leg: Secondary | ICD-10-CM | POA: Diagnosis not present

## 2019-04-05 DIAGNOSIS — M9905 Segmental and somatic dysfunction of pelvic region: Secondary | ICD-10-CM | POA: Diagnosis not present

## 2019-04-05 DIAGNOSIS — M9906 Segmental and somatic dysfunction of lower extremity: Secondary | ICD-10-CM | POA: Diagnosis not present

## 2019-04-06 DIAGNOSIS — M5127 Other intervertebral disc displacement, lumbosacral region: Secondary | ICD-10-CM | POA: Diagnosis not present

## 2019-04-12 DIAGNOSIS — Z01818 Encounter for other preprocedural examination: Secondary | ICD-10-CM | POA: Diagnosis not present

## 2019-04-18 DIAGNOSIS — M5127 Other intervertebral disc displacement, lumbosacral region: Secondary | ICD-10-CM | POA: Diagnosis not present

## 2019-04-18 DIAGNOSIS — M5117 Intervertebral disc disorders with radiculopathy, lumbosacral region: Secondary | ICD-10-CM | POA: Diagnosis not present

## 2019-04-28 DIAGNOSIS — M545 Low back pain: Secondary | ICD-10-CM | POA: Diagnosis not present

## 2019-04-28 DIAGNOSIS — M5126 Other intervertebral disc displacement, lumbar region: Secondary | ICD-10-CM | POA: Diagnosis not present

## 2019-04-28 DIAGNOSIS — M5417 Radiculopathy, lumbosacral region: Secondary | ICD-10-CM | POA: Diagnosis not present

## 2019-04-28 DIAGNOSIS — M47816 Spondylosis without myelopathy or radiculopathy, lumbar region: Secondary | ICD-10-CM | POA: Diagnosis not present

## 2019-05-24 DIAGNOSIS — M5126 Other intervertebral disc displacement, lumbar region: Secondary | ICD-10-CM | POA: Diagnosis not present

## 2019-05-24 DIAGNOSIS — M62838 Other muscle spasm: Secondary | ICD-10-CM | POA: Diagnosis not present

## 2019-05-24 DIAGNOSIS — M5417 Radiculopathy, lumbosacral region: Secondary | ICD-10-CM | POA: Diagnosis not present

## 2019-05-24 DIAGNOSIS — M26622 Arthralgia of left temporomandibular joint: Secondary | ICD-10-CM | POA: Diagnosis not present

## 2019-05-26 DIAGNOSIS — M5442 Lumbago with sciatica, left side: Secondary | ICD-10-CM | POA: Diagnosis not present

## 2019-05-31 DIAGNOSIS — M5442 Lumbago with sciatica, left side: Secondary | ICD-10-CM | POA: Diagnosis not present

## 2019-06-03 DIAGNOSIS — M5442 Lumbago with sciatica, left side: Secondary | ICD-10-CM | POA: Diagnosis not present

## 2019-06-14 ENCOUNTER — Encounter: Payer: Self-pay | Admitting: Certified Nurse Midwife

## 2019-06-14 DIAGNOSIS — M5442 Lumbago with sciatica, left side: Secondary | ICD-10-CM | POA: Diagnosis not present

## 2019-06-17 DIAGNOSIS — M5442 Lumbago with sciatica, left side: Secondary | ICD-10-CM | POA: Diagnosis not present

## 2019-06-20 DIAGNOSIS — M5416 Radiculopathy, lumbar region: Secondary | ICD-10-CM | POA: Diagnosis not present

## 2019-06-20 DIAGNOSIS — M9905 Segmental and somatic dysfunction of pelvic region: Secondary | ICD-10-CM | POA: Diagnosis not present

## 2019-06-20 DIAGNOSIS — M9903 Segmental and somatic dysfunction of lumbar region: Secondary | ICD-10-CM | POA: Diagnosis not present

## 2019-06-20 DIAGNOSIS — M25551 Pain in right hip: Secondary | ICD-10-CM | POA: Diagnosis not present

## 2019-06-22 DIAGNOSIS — M5442 Lumbago with sciatica, left side: Secondary | ICD-10-CM | POA: Diagnosis not present

## 2019-06-24 DIAGNOSIS — M5442 Lumbago with sciatica, left side: Secondary | ICD-10-CM | POA: Diagnosis not present

## 2019-06-28 ENCOUNTER — Ambulatory Visit (INDEPENDENT_AMBULATORY_CARE_PROVIDER_SITE_OTHER): Payer: Medicare Other | Admitting: Family Medicine

## 2019-06-28 ENCOUNTER — Encounter: Payer: Self-pay | Admitting: Family Medicine

## 2019-06-28 ENCOUNTER — Telehealth: Payer: Self-pay

## 2019-06-28 VITALS — Ht 68.25 in

## 2019-06-28 DIAGNOSIS — K529 Noninfective gastroenteritis and colitis, unspecified: Secondary | ICD-10-CM

## 2019-06-28 MED ORDER — ONDANSETRON HCL 4 MG PO TABS: 4.0000 mg | ORAL_TABLET | Freq: Three times a day (TID) | ORAL | 0 refills | Status: DC | PRN

## 2019-06-28 MED ORDER — PROMETHAZINE HCL 12.5 MG RE SUPP: 12.5000 mg | Freq: Four times a day (QID) | RECTAL | 0 refills | Status: DC | PRN

## 2019-06-28 NOTE — Telephone Encounter (Signed)
Pts husband was called back and virtual visit was scheduled at 2:30.

## 2019-06-28 NOTE — Telephone Encounter (Signed)
Patient's husband states husband patient has been throwing up since 2AM. He had the same stomach virus last week but she seems to be doing worse than he did. He is worried she is going to become dehydrated and wants to know if there is a shot that we can give her for the nausea.

## 2019-06-28 NOTE — Progress Notes (Signed)
VIRTUAL VISIT VIA VIDEO  I connected with [name] on 06/28/19 at  2:30 PM EDT by elemedicine application and verified that I am speaking with the correct person using two identifiers. Location patient: Home Location provider: [facility], Office Persons participating in the virtual visit: Patient, Dr. Raoul Pitch and R.Baker, LPN  I discussed the limitations of evaluation and management by telemedicine and the availability of in person appointments. The patient expressed understanding and agreed to proceed.   SUBJECTIVE Chief Complaint  Patient presents with  . Emesis    Started at 2am with vomiting and nausea. Denies fever. Pt has taken Zofran. Pt husband had stomach bug over the past week   . Nausea  . Headache    HPI: Diana Gross is a 64 y.o. female present for gastrointestinal illness that started between 2 and 3 AM last night.  She states she has had multiple episodes of nausea and vomiting occurring routinely at least 2-3 times an hour since that time.  She denies fever but does endorse having chills.  She is on chronic opioids for pain and has been unable to tolerate anything on her stomach therefore unable to take her medications.  She does have Zofran on hand and attempted to take the medication at onset of nausea and vomiting however was unable to tolerate.  She states she was finally able to keep down Zofran around 9:45 AM and was able to take her chronic pain meds around 10 AM.  She did have one episode of loose stool around 8 AM.  However she states that she frequently will have that if she misses her chronic opioid.  She has mild cramping of her stomach, headache and now mild myalgias.  She denies any coughing, shortness of breath or fever.  She states her husband had a very similar gastrointestinal illness this past Friday.  She states his vomiting lasted about 6-7 hours and then he remained fatigued for about 3 days.  Neither has had known  COVID-19 exposure.  ROS: See pertinent positives and negatives per HPI.  Patient Active Problem List   Diagnosis Date Noted  . Obesity (BMI 30-39.9) 01/07/2019  . Genetic testing 01/08/2017  . Family history of breast cancer   . Hair loss 12/15/2016  . Medicare annual wellness visit, subsequent 07/02/2015  . Skin lesion 07/02/2015  . Family history of glaucoma 07/02/2015  . Eye pressure 07/02/2015  . Nonallopathic lesion of cervical region 06/12/2015  . Nonallopathic lesion of thoracic region 06/12/2015  . Nonallopathic lesion of lumbosacral region 06/12/2015  . Somatic dysfunction 05/01/2015  . Vaginal prolapse 05/01/2015  . Hormone replacement therapy 05/01/2015  . Chronic pain syndrome 05/01/2015  . Fibromyalgia 05/01/2015  . Chronic narcotic use 05/01/2015  . Occipital neuralgia 05/01/2015  . Trigeminal neuralgia 05/01/2015    Social History   Tobacco Use  . Smoking status: Never Smoker  . Smokeless tobacco: Never Used  Substance Use Topics  . Alcohol use: No    Alcohol/week: 0.0 standard drinks    Current Outpatient Medications:  .  ALPRAZolam (XANAX) 1 MG tablet, Takes 1/2 to 1 tablet up to 3 times a day, Disp: , Rfl: 0 .  Cholecalciferol (D3 SUPER STRENGTH) 2000 units CAPS, Take 2,000 Units by mouth daily., Disp: , Rfl:  .  conjugated estrogens (PREMARIN) vaginal cream, Place 1/2 gram vaginally at bedtime two three times a week. Do not overuse., Disp: 30 g, Rfl: 2 .  diphenhydrAMINE (BENADRYL) 25 MG tablet,  Take 25 mg by mouth as needed., Disp: , Rfl:  .  docusate sodium (COLACE) 100 MG capsule, Take 100 mg by mouth 2 (two) times daily., Disp: , Rfl:  .  estradiol (CLIMARA - DOSED IN MG/24 HR) 0.05 mg/24hr patch, Place 1 patch (0.05 mg total) onto the skin once a week., Disp: 12 patch, Rfl: 3 .  famotidine (PEPCID) 10 MG tablet, Take 10 mg by mouth 2 (two) times daily., Disp: , Rfl:  .  fexofenadine (ALLEGRA) 180 MG tablet, Take 180 mg by mouth as needed. , Disp: ,  Rfl:  .  fluticasone (FLONASE) 50 MCG/ACT nasal spray, Place into both nostrils as needed. , Disp: , Rfl:  .  L-Theanine 100 MG CAPS, Take 2 capsules by mouth daily., Disp: , Rfl:  .  magnesium 30 MG tablet, Take by mouth. , Disp: , Rfl:  .  NARCAN 4 MG/0.1ML LIQD nasal spray kit, , Disp: , Rfl:  .  NON FORMULARY, 15 mg. Hempworks 500 - 55m hemp oil 7577m Disp: , Rfl:  .  NON FORMULARY, 1 tablet daily. collagen, Disp: , Rfl:  .  ondansetron (ZOFRAN-ODT) 4 MG disintegrating tablet, Take 4 mg by mouth every 4 (four) hours as needed for nausea or vomiting. , Disp: , Rfl:  .  oxyCODONE (ROXICODONE) 15 MG immediate release tablet, Take 1 tablet by mouth every 4 (four) hours., Disp: , Rfl: 0 .  OXYCONTIN 15 MG 12 hr tablet, Take 45 mg by mouth. , Disp: , Rfl: 0 .  SUMAtriptan (IMITREX) 50 MG tablet, as needed. , Disp: , Rfl: 0 .  tiZANidine (ZANAFLEX) 4 MG tablet, Takes 1/2 when needed, Disp: , Rfl:  .  ondansetron (ZOFRAN) 4 MG tablet, Take 1 tablet (4 mg total) by mouth every 8 (eight) hours as needed for nausea or vomiting., Disp: 20 tablet, Rfl: 0 .  promethazine (PHENERGAN) 12.5 MG suppository, Place 1 suppository (12.5 mg total) rectally every 6 (six) hours as needed for nausea or vomiting., Disp: 6 each, Rfl: 0  Allergies  Allergen Reactions  . Amitriptyline Rash    Other Reaction: involuntary movements  . Baclofen Diarrhea and Other (See Comments)    Other Reaction: rigid muscle, cramping  . Bupropion Other (See Comments)    Other Reaction: insominia, severe agitation  . Clonazepam Itching  . Duloxetine Rash    Other Reaction: high blood sugar, skin rash  . Fluoxetine Anxiety    Other Reaction: insomnia, severe agitation  . Gabapentin Other (See Comments)    Other Reaction: throat closing up, hallucinates  . Oxcarbazepine Rash  . Paroxetine Hcl Anxiety    Other Reaction: insomnia, severe agitation  . Penicillins Nausea And Vomiting  . Tapentadol Rash  . Topiramate     Other  reaction(s): Other (See Comments) Other Reaction: inconttinence/urine  . Tramadol Anxiety    Other Reaction: heart racing/ panic attacks  . Carbamazepine Rash  . Carisoprodol Nausea Only  . Ibuprofen Rash  . Metaxalone Other (See Comments)    Other Reaction: Other reaction  . Morphine Rash  . Naproxen Sodium Rash  . Oxymorphone Rash  . Augmentin [Amoxicillin-Pot Clavulanate]   . Decadron  [Dexamethasone]     Other reaction(s): Unknown Uncoded Allergy. Allergen: DECADRON  . Fentanyl Other (See Comments)    Suicidal ideation  . Pregabalin Swelling    Other Reaction: swelling of hands/feet  . Prozac [Fluoxetine Hcl] Other (See Comments)    insomnia  . Ativan [Lorazepam] Anxiety  . Celebrex [  Celecoxib] Rash  . Cymbalta [Duloxetine Hcl] Rash    Elevated blood sugar  . Flexeril [Cyclobenzaprine] Rash and Itching  . Nortriptyline Rash  . Opana [Oxymorphone Hcl] Rash  . Valium [Diazepam] Rash  . Vimpat [Lacosamide] Rash  . Zorvolex [Diclofenac] Anxiety    OBJECTIVE: Ht 5' 8.25" (1.734 m)   LMP  (LMP Unknown)   BMI 29.73 kg/m  Gen: No acute distress. Nontoxic in appearance.  Appears mildly pale-but nontoxic. HENT: AT. [place].  MMM.  Eyes:Pupils Equal Round Reactive to light, Extraocular movements intact,  Conjunctiva without redness, discharge or icterus. Chest: Cough or shortness of breath not present Skin: No rashes, purpura or petechiae.  Neuro:  Alert. Oriented x3  Psych: Normal affect and demeanor. Normal speech. Normal thought content and judgment.  ASSESSMENT AND PLAN: Diana Gross is a 64 y.o. female present for  Gastroenteritis Likely viral gastroenteritis since her husband also recently had similar illness. She is tolerating Zofran since 945 but does admit to continued nausea.  Will refill Zofran for her today-she is actually tolerating the Zofran tabs versus disintegrating tablets at this time. Also discussed the use of low-dose Phenergan suppositories as  a last resort if unable to tolerate Zofran.  Discussed caution with sedation and side effects with her opiates.  She reports understanding. Clear liquid diet next 24 hours and then slowly advance as tolerated Strongly encouraged her to hydrate.  Water, ginger ale, G2.  Must replace electrolytes with G2.  Patient was encouraged to monitor her urine output and color.  If urine is golden in color need to continue to increase fluid until very light yellow to clear.  She reports understanding.  If unable to tolerate fluids she will need to go to the urgent care or emergency room in order to obtain IV fluid hydration and emergent labs to monitor her kidney function with her use of chronic medications.  Patient reports understanding Patient and her husband are arranging COVID-19 testing. Follow-up as needed.   Howard Pouch, DO 06/28/2019   No orders of the defined types were placed in this encounter.  Meds ordered this encounter  Medications  . ondansetron (ZOFRAN) 4 MG tablet    Sig: Take 1 tablet (4 mg total) by mouth every 8 (eight) hours as needed for nausea or vomiting.    Dispense:  20 tablet    Refill:  0  . promethazine (PHENERGAN) 12.5 MG suppository    Sig: Place 1 suppository (12.5 mg total) rectally every 6 (six) hours as needed for nausea or vomiting.    Dispense:  6 each    Refill:  0   Referral Orders  No referral(s) requested today

## 2019-06-28 NOTE — Patient Instructions (Signed)

## 2019-07-01 DIAGNOSIS — M9901 Segmental and somatic dysfunction of cervical region: Secondary | ICD-10-CM | POA: Diagnosis not present

## 2019-07-01 DIAGNOSIS — M26629 Arthralgia of temporomandibular joint, unspecified side: Secondary | ICD-10-CM | POA: Diagnosis not present

## 2019-07-01 DIAGNOSIS — M9900 Segmental and somatic dysfunction of head region: Secondary | ICD-10-CM | POA: Diagnosis not present

## 2019-07-01 DIAGNOSIS — G500 Trigeminal neuralgia: Secondary | ICD-10-CM | POA: Diagnosis not present

## 2019-07-19 DIAGNOSIS — M62838 Other muscle spasm: Secondary | ICD-10-CM | POA: Diagnosis not present

## 2019-07-19 DIAGNOSIS — M5481 Occipital neuralgia: Secondary | ICD-10-CM | POA: Diagnosis not present

## 2019-07-19 DIAGNOSIS — M26622 Arthralgia of left temporomandibular joint: Secondary | ICD-10-CM | POA: Diagnosis not present

## 2019-07-19 DIAGNOSIS — M5417 Radiculopathy, lumbosacral region: Secondary | ICD-10-CM | POA: Diagnosis not present

## 2019-08-03 ENCOUNTER — Ambulatory Visit: Payer: Medicare Other | Admitting: Family Medicine

## 2019-08-03 ENCOUNTER — Encounter: Payer: Self-pay | Admitting: Family Medicine

## 2019-08-03 ENCOUNTER — Other Ambulatory Visit: Payer: Self-pay

## 2019-08-03 VITALS — BP 132/82 | HR 88 | Temp 97.9°F | Resp 18 | Ht 68.25 in | Wt 196.0 lb

## 2019-08-03 DIAGNOSIS — N3001 Acute cystitis with hematuria: Secondary | ICD-10-CM

## 2019-08-03 DIAGNOSIS — R350 Frequency of micturition: Secondary | ICD-10-CM | POA: Diagnosis not present

## 2019-08-03 LAB — POCT URINALYSIS DIPSTICK
Bilirubin, UA: NEGATIVE
Glucose, UA: NEGATIVE
Ketones, UA: NEGATIVE
Nitrite, UA: POSITIVE
Protein, UA: POSITIVE — AB
Spec Grav, UA: 1.02 (ref 1.010–1.025)
Urobilinogen, UA: 1 E.U./dL
pH, UA: 6 (ref 5.0–8.0)

## 2019-08-03 MED ORDER — CEPHALEXIN 500 MG PO CAPS: 500.0000 mg | ORAL_CAPSULE | Freq: Three times a day (TID) | ORAL | 0 refills | Status: DC
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
## 2019-08-03 NOTE — Patient Instructions (Signed)
Start keflex every 8 hrs for 7 days.  We will call you with lab results once we get.  Hydrate. You can continue azo per package label.

## 2019-08-05 ENCOUNTER — Encounter: Payer: Self-pay | Admitting: Family Medicine

## 2019-08-05 LAB — CULTURE INDICATED

## 2019-08-05 LAB — URINALYSIS W MICROSCOPIC + REFLEX CULTURE
Bilirubin Urine: NEGATIVE
Glucose, UA: NEGATIVE
Hyaline Cast: NONE SEEN /LPF
Ketones, ur: NEGATIVE
Nitrites, Initial: POSITIVE — AB
Specific Gravity, Urine: 1.016 (ref 1.001–1.03)
Squamous Epithelial / HPF: NONE SEEN /HPF (ref <=5)
WBC, UA: >=60 /HPF — AB (ref 0–5)
pH: 6.5 (ref 5.0–8.0)

## 2019-08-05 LAB — URINE CULTURE
MICRO NUMBER:: 10473825
SPECIMEN QUALITY:: ADEQUATE

## 2019-08-08 ENCOUNTER — Telehealth: Payer: Self-pay

## 2019-08-08 MED ORDER — CIPROFLOXACIN HCL 500 MG PO TABS: 500.0000 mg | ORAL_TABLET | Freq: Two times a day (BID) | ORAL | 0 refills | Status: DC

## 2019-08-08 NOTE — Telephone Encounter (Signed)
Called in cipro BID x 3 days to finish treatment of UTI to [facility]. Keflex added to allergies

## 2019-08-08 NOTE — Addendum Note (Signed)
Addended by: Howard Pouch A on: 08/08/2019 10:46 AM   Modules accepted: Orders

## 2019-08-08 NOTE — Telephone Encounter (Signed)
[facility] - Client TELEPHONE ADVICE RECORD [facility] Patient Name: Diana [facility] Gross Gender: Female DOB: Sep 04, 1955 Age: 64 Y 1 M 19 D Return Phone Number: [phone] (Primary), [phone] (Secondary) Address: City/State/Zip: [place] [address] Client [facility] - Client Client Site [place] - Day Physician Raoul Pitch, [place] Contact Type Call Who Is Calling Patient / Member / Family / Caregiver Call Type Triage / Clinical Relationship To Patient Self Return Phone Number [phone] (Primary) Chief Complaint Medication reaction Reason for Call Symptomatic / Request for [place] states she thinks she is allergic to rx, Keflex. She has a rash on her face, neck and arms. Translation No Nurse Assessment Nurse: Kenton Kingfisher, RN, [place] [employer] Date/Time (Eastern Time): 08/06/2019 12:47:34 PM Confirm and document reason for call. If symptomatic, describe symptoms.    ---Caller states she started taking Keflex on Wednesday for UTI and she noticed a rash last night. Patient states she stopped taking the medication this morning and the rash has gotten better since taking a Benadryl and stopping the Keflex. She states it is red and bumpy on bilateral arms, chest, neck, and face.   Has the patient had close contact with a person known or suspected to have the novel coronavirus illness OR traveled / lives in area with major community spread (including international travel) in the last 14 days from the onset of symptoms? * If Asymptomatic, screen for exposure and travel within the last 14 days. ---No Does the patient have any new or worsening symptoms? ---Yes Will a triage be completed? ---Yes Related visit to physician within the last 2 weeks? ---No Does the PT have any chronic conditions? (i.e. diabetes, asthma, this includes High risk factors for pregnancy, etc.) ---Yes List chronic  conditions. ---Trigeminal and Occipital Neuralgia Chronic pain, Fibromyalgia Is this a behavioral health or substance abuse call? ---No Guidelines Guideline Title Affirmed Question Affirmed Notes Nurse Date/Time (Eastern Time) Rash - Widespread On Drugs Taking new prescription antibiotic (Exception: finished taking new prescription antibiotic) Kenton Kingfisher, RN, [employer] [employer] 08/06/2019 12:53:24 PMPLEASE NOTE: All timestamps contained within this report are represented as [place] Standard Time. CONFIDENTIALTY NOTICE: This fax transmission is intended only for the addressee. It contains information that is legally privileged, confidential or otherwise protected from use or disclosure. If you are not the intended recipient, you are strictly prohibited from reviewing, disclosing, copying using or disseminating any of this information or taking any action in reliance on or regarding this information. If you have received this fax in error, please notify us immediately by telephone so that we can arrange for its return to [place]. Phone: [phone], Toll-Free: [phone], Fax: [phone] Page: 2 of 2 Call Id: [id number] [place]. Time Eilene Ghazi Time) Disposition Final User 08/06/2019 12:57:53 PM See PCP within 24 Hours Yes Kenton Kingfisher, RN, [employer] [employer] Caller Disagree/Comply Comply Caller Understands Yes PreDisposition Go to Urgent Care/Walk-In Clinic Care Advice Given Per Guideline SEE PCP WITHIN 24 HOURS: STOP THE MEDICATION: * Stop the medicine until examined. ANTIHISTAMINE FOR HIVES OR SEVERE ITCHING: * Take an antihistamine by mouth. Diphenhydramine (OTC Benadryl) is a good choice. Adult dose is 25-50 mg. Take it up to 4 times a day. * Other OTC antihistamine: loratadine (Alavert, Claritin). * If the rash or itching is mild, do not take an antihistamine during the 8 hours before seeing the doctor (Reason: so the doctor can see the full-blown rash). CAUTION - ANTIHISTAMINES: * Examples include diphenhydramine (Benadryl)  and chlorpheniramine (  Chlortrimeton, Chlor-tripolon) * Do not take these medicines if you have prostate problems. * May cause sleepiness. Do not drink alcohol, drive, or operate dangerous machinery while taking antihistamines. * Read the package instructions and warnings. CALL BACK IF: * You become worse. CARE ADVICE given per Rash - Widespread on Drugs (Adult) guideline Referrals GO TO FACILITY UNDECIDED

## 2019-08-08 NOTE — Telephone Encounter (Signed)
Pt was called and given information, she verbalized understanding  

## 2019-08-26 DIAGNOSIS — M9903 Segmental and somatic dysfunction of lumbar region: Secondary | ICD-10-CM | POA: Diagnosis not present

## 2019-08-26 DIAGNOSIS — M9902 Segmental and somatic dysfunction of thoracic region: Secondary | ICD-10-CM | POA: Diagnosis not present

## 2019-08-26 DIAGNOSIS — M9905 Segmental and somatic dysfunction of pelvic region: Secondary | ICD-10-CM | POA: Diagnosis not present

## 2019-08-26 DIAGNOSIS — M9906 Segmental and somatic dysfunction of lower extremity: Secondary | ICD-10-CM | POA: Diagnosis not present

## 2019-08-26 DIAGNOSIS — M7918 Myalgia, other site: Secondary | ICD-10-CM | POA: Diagnosis not present

## 2019-08-26 DIAGNOSIS — M25551 Pain in right hip: Secondary | ICD-10-CM | POA: Diagnosis not present

## 2019-08-26 DIAGNOSIS — M545 Low back pain: Secondary | ICD-10-CM | POA: Diagnosis not present

## 2019-08-26 DIAGNOSIS — M7631 Iliotibial band syndrome, right leg: Secondary | ICD-10-CM | POA: Diagnosis not present

## 2019-09-01 DIAGNOSIS — G894 Chronic pain syndrome: Secondary | ICD-10-CM | POA: Diagnosis not present

## 2019-09-01 DIAGNOSIS — M5481 Occipital neuralgia: Secondary | ICD-10-CM | POA: Diagnosis not present

## 2019-09-01 DIAGNOSIS — M62838 Other muscle spasm: Secondary | ICD-10-CM | POA: Diagnosis not present

## 2019-10-04 DIAGNOSIS — M542 Cervicalgia: Secondary | ICD-10-CM | POA: Diagnosis not present

## 2019-10-04 DIAGNOSIS — M9908 Segmental and somatic dysfunction of rib cage: Secondary | ICD-10-CM | POA: Diagnosis not present

## 2019-10-04 DIAGNOSIS — G500 Trigeminal neuralgia: Secondary | ICD-10-CM | POA: Diagnosis not present

## 2019-10-04 DIAGNOSIS — M26629 Arthralgia of temporomandibular joint, unspecified side: Secondary | ICD-10-CM | POA: Diagnosis not present

## 2019-10-04 DIAGNOSIS — M9907 Segmental and somatic dysfunction of upper extremity: Secondary | ICD-10-CM | POA: Diagnosis not present

## 2019-10-04 DIAGNOSIS — M9901 Segmental and somatic dysfunction of cervical region: Secondary | ICD-10-CM | POA: Diagnosis not present

## 2019-10-04 DIAGNOSIS — R0781 Pleurodynia: Secondary | ICD-10-CM | POA: Diagnosis not present

## 2019-10-04 DIAGNOSIS — M9900 Segmental and somatic dysfunction of head region: Secondary | ICD-10-CM | POA: Diagnosis not present

## 2019-10-17 ENCOUNTER — Telehealth: Payer: Self-pay

## 2019-10-17 DIAGNOSIS — M62838 Other muscle spasm: Secondary | ICD-10-CM | POA: Diagnosis not present

## 2019-10-17 DIAGNOSIS — M5481 Occipital neuralgia: Secondary | ICD-10-CM | POA: Diagnosis not present

## 2019-10-17 DIAGNOSIS — M5126 Other intervertebral disc displacement, lumbar region: Secondary | ICD-10-CM | POA: Diagnosis not present

## 2019-10-17 DIAGNOSIS — M5417 Radiculopathy, lumbosacral region: Secondary | ICD-10-CM | POA: Diagnosis not present

## 2019-10-17 NOTE — Telephone Encounter (Signed)
Patient gained 9 pounds in 7 days. Patient has rash that developed 10/14/19 on both arms and neck.  Patient had 2nd COVID vaccine 10/10/19.  Patient is requesting an appointment but there no openings.

## 2019-10-17 NOTE — Telephone Encounter (Signed)
Can this pt be placed on the schedule for 4pm tomorrow? Or we can see if another [facility] provider can see her. Please advise.

## 2019-10-17 NOTE — Telephone Encounter (Signed)
Patient can be placed in the 8:00 or 11:00 slot tomorrow (Tuesday, July 27).

## 2019-10-24 ENCOUNTER — Encounter: Payer: Self-pay | Admitting: Family Medicine

## 2019-10-24 ENCOUNTER — Ambulatory Visit (INDEPENDENT_AMBULATORY_CARE_PROVIDER_SITE_OTHER): Payer: Medicare Other | Admitting: Family Medicine

## 2019-10-24 ENCOUNTER — Other Ambulatory Visit: Payer: Self-pay

## 2019-10-24 VITALS — BP 130/80 | HR 68 | Temp 97.6°F | Ht 68.25 in | Wt 215.2 lb

## 2019-10-24 DIAGNOSIS — R600 Localized edema: Secondary | ICD-10-CM | POA: Diagnosis not present

## 2019-10-24 NOTE — Patient Instructions (Signed)
Keep feet elevated when able.  Compression stockings if needed.   If swelling occurs again be seen immediately- may need to consider starting a diuretic at that time.     Edema  Edema is when you have too much fluid in your body or under your skin. Edema may make your legs, feet, and ankles swell up. Swelling is also common in looser tissues, like around your eyes. This is a common condition. It gets more common as you get older. There are many possible causes of edema. Eating too much salt (sodium) and being on your feet or sitting for a long time can cause edema in your legs, feet, and ankles. Hot weather may make edema worse. Edema is usually painless. Your skin may look swollen or shiny. Follow these instructions at home:  Keep the swollen body part raised (elevated) above the level of your heart when you are sitting or lying down.  Do not sit still or stand for a long time.  Do not wear tight clothes. Do not wear garters on your upper legs.  Exercise your legs. This can help the swelling go down.  Wear elastic bandages or support stockings as told by your doctor.  Eat a low-salt (low-sodium) diet to reduce fluid as told by your doctor.  Depending on the cause of your swelling, you may need to limit how much fluid you drink (fluid restriction).  Take over-the-counter and prescription medicines only as told by your doctor. Contact a doctor if:  Treatment is not working.  You have heart, liver, or kidney disease and have symptoms of edema.  You have sudden and unexplained weight gain. Get help right away if:  You have shortness of breath or chest pain.  You cannot breathe when you lie down.  You have pain, redness, or warmth in the swollen areas.  You have heart, liver, or kidney disease and get edema all of a sudden.  You have a fever and your symptoms get worse all of a sudden. Summary  Edema is when you have too much fluid in your body or under your skin.  Edema  may make your legs, feet, and ankles swell up. Swelling is also common in looser tissues, like around your eyes.  Raise (elevate) the swollen body part above the level of your heart when you are sitting or lying down.  Follow your doctor's instructions about diet and how much fluid you can drink (fluid restriction). This information is not intended to replace advice given to you by your health care provider. Make sure you discuss any questions you have with your health care provider. Document Revised: 03/13/2017 Document Reviewed: 03/28/2016 Elsevier Patient Education  2020 [employer].
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
## 2019-11-04 DIAGNOSIS — Z1231 Encounter for screening mammogram for malignant neoplasm of breast: Secondary | ICD-10-CM | POA: Diagnosis not present

## 2019-11-08 DIAGNOSIS — M9901 Segmental and somatic dysfunction of cervical region: Secondary | ICD-10-CM | POA: Diagnosis not present

## 2019-11-08 DIAGNOSIS — M9900 Segmental and somatic dysfunction of head region: Secondary | ICD-10-CM | POA: Diagnosis not present

## 2019-11-08 DIAGNOSIS — M7918 Myalgia, other site: Secondary | ICD-10-CM | POA: Diagnosis not present

## 2019-11-08 DIAGNOSIS — G500 Trigeminal neuralgia: Secondary | ICD-10-CM | POA: Diagnosis not present

## 2019-11-18 ENCOUNTER — Telehealth: Payer: Self-pay | Admitting: Family Medicine

## 2019-11-18 DIAGNOSIS — M62838 Other muscle spasm: Secondary | ICD-10-CM | POA: Diagnosis not present

## 2019-11-18 DIAGNOSIS — M5481 Occipital neuralgia: Secondary | ICD-10-CM | POA: Diagnosis not present

## 2019-11-18 NOTE — Telephone Encounter (Signed)
LVM that appt has been resched to 12/21/19 [facility]

## 2019-11-29 ENCOUNTER — Encounter: Payer: Self-pay | Admitting: Obstetrics and Gynecology

## 2019-12-06 DIAGNOSIS — M9901 Segmental and somatic dysfunction of cervical region: Secondary | ICD-10-CM | POA: Diagnosis not present

## 2019-12-06 DIAGNOSIS — M542 Cervicalgia: Secondary | ICD-10-CM | POA: Diagnosis not present

## 2019-12-06 DIAGNOSIS — M26629 Arthralgia of temporomandibular joint, unspecified side: Secondary | ICD-10-CM | POA: Diagnosis not present

## 2019-12-06 DIAGNOSIS — M9900 Segmental and somatic dysfunction of head region: Secondary | ICD-10-CM | POA: Diagnosis not present

## 2019-12-13 ENCOUNTER — Ambulatory Visit: Payer: Medicare Other

## 2019-12-19 DIAGNOSIS — M5417 Radiculopathy, lumbosacral region: Secondary | ICD-10-CM | POA: Diagnosis not present

## 2019-12-19 DIAGNOSIS — M26621 Arthralgia of right temporomandibular joint: Secondary | ICD-10-CM | POA: Diagnosis not present

## 2019-12-19 DIAGNOSIS — M50323 Other cervical disc degeneration at C6-C7 level: Secondary | ICD-10-CM | POA: Diagnosis not present

## 2019-12-19 DIAGNOSIS — M26622 Arthralgia of left temporomandibular joint: Secondary | ICD-10-CM | POA: Diagnosis not present
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
## 2019-12-21 ENCOUNTER — Other Ambulatory Visit: Payer: Self-pay

## 2019-12-21 ENCOUNTER — Telehealth: Payer: Self-pay

## 2019-12-21 ENCOUNTER — Ambulatory Visit (INDEPENDENT_AMBULATORY_CARE_PROVIDER_SITE_OTHER): Payer: Medicare Other

## 2019-12-21 VITALS — BP 130/80 | HR 72 | Temp 98.0°F | Resp 16 | Ht 68.0 in | Wt 210.0 lb

## 2019-12-21 DIAGNOSIS — Z0000 Encounter for general adult medical examination without abnormal findings: Secondary | ICD-10-CM

## 2019-12-21 DIAGNOSIS — E2839 Other primary ovarian failure: Secondary | ICD-10-CM

## 2019-12-21 DIAGNOSIS — Z23 Encounter for immunization: Secondary | ICD-10-CM | POA: Diagnosis not present

## 2019-12-21 DIAGNOSIS — Z780 Asymptomatic menopausal state: Secondary | ICD-10-CM

## 2019-12-21 NOTE — Patient Instructions (Addendum)
[name] , Thank you for taking time to come for your Medicare Wellness Visit. I appreciate your ongoing commitment to your health goals. Please review the following plan we discussed and let me know if I can assist you in the future.   Screening recommendations/referrals: Colonoscopy: Completed 09/22/2010- Due 09/21/2020 Mammogram: Completed 11/04/2019- Due 8/113/2022 Bone Density: Ordered today. Someone will be calling you to schedule Recommended yearly ophthalmology/optometry visit for glaucoma screening and checkup Recommended yearly dental visit for hygiene and checkup  Vaccinations: Influenza vaccine: Administered today Pneumococcal vaccine: Due- May obtain in our office or at your local pharmacy Tdap vaccine: Up to Date- Due-01/28/2024 Shingles vaccine: Discuss with pharmacy   Covid-19:Completed vaccines  Advanced directives: Please bring a copy for your chart  Conditions/risks identified: See problem list  Next appointment: Follow up in one year for your annual wellness visit 01/02/2021 @ 10:30am   Preventive Care 65 Years and Older, Female Preventive care refers to lifestyle choices and visits with your health care provider that can promote health and wellness. What does preventive care include?  A yearly physical exam. This is also called an annual well check.  Dental exams once or twice a year.  Routine eye exams. Ask your health care provider how often you should have your eyes checked.  Personal lifestyle choices, including:  Daily care of your teeth and gums.  Regular physical activity.  Eating a healthy diet.  Avoiding tobacco and drug use.  Limiting alcohol use.  Practicing safe sex.  Taking low-dose aspirin every day.  Taking vitamin and mineral supplements as recommended by your health care provider. What happens during an annual well check? The services and screenings done by your health care provider during your annual well check will depend  on your age, overall health, lifestyle risk factors, and family history of disease. Counseling  Your health care provider may ask you questions about your:  Alcohol use.  Tobacco use.  Drug use.  Emotional well-being.  Home and relationship well-being.  Sexual activity.  Eating habits.  History of falls.  Memory and ability to understand (cognition).  Work and work Statistician.  Reproductive health. Screening  You may have the following tests or measurements:  Height, weight, and BMI.  Blood pressure.  Lipid and cholesterol levels. These may be checked every 5 years, or more frequently if you are over 52 years old.  Skin check.  Lung cancer screening. You may have this screening every year starting at age 72 if you have a 30-pack-year history of smoking and currently smoke or have quit within the past 15 years.  Fecal occult blood test (FOBT) of the stool. You may have this test every year starting at age 89.  Flexible sigmoidoscopy or colonoscopy. You may have a sigmoidoscopy every 5 years or a colonoscopy every 10 years starting at age 12.  Hepatitis C blood test.  Hepatitis B blood test.  Sexually transmitted disease (STD) testing.  Diabetes screening. This is done by checking your blood sugar (glucose) after you have not eaten for a while (fasting). You may have this done every 1-3 years.  Bone density scan. This is done to screen for osteoporosis. You may have this done starting at age 40.  Mammogram. This may be done every 1-2 years. Talk to your health care provider about how often you should have regular mammograms. Talk with your health care provider about your test results, treatment options, and if necessary, the need for more tests. Vaccines  Your  health care provider may recommend certain vaccines, such as:  Influenza vaccine. This is recommended every year.  Tetanus, diphtheria, and acellular pertussis (Tdap, Td) vaccine. You may need a Td  booster every 10 years.  Zoster vaccine. You may need this after age 56.  Pneumococcal 13-valent conjugate (PCV13) vaccine. One dose is recommended after age 63.  Pneumococcal polysaccharide (PPSV23) vaccine. One dose is recommended after age 37. Talk to your health care provider about which screenings and vaccines you need and how often you need them. This information is not intended to replace advice given to you by your health care provider. Make sure you discuss any questions you have with your health care provider. Document Released: 04/06/2015 Document Revised: 11/28/2015 Document Reviewed: 01/09/2015 Elsevier Interactive Patient Education  2017 [place] Prevention in the Home Falls can cause injuries. They can happen to people of all ages. There are many things you can do to make your home safe and to help prevent falls. What can I do on the outside of my home?  Regularly fix the edges of walkways and driveways and fix any cracks.  Remove anything that might make you trip as you walk through a door, such as a raised step or threshold.  Trim any bushes or trees on the path to your home.  Use bright outdoor lighting.  Clear any walking paths of anything that might make someone trip, such as rocks or tools.  Regularly check to see if handrails are loose or broken. Make sure that both sides of any steps have handrails.  Any raised decks and porches should have guardrails on the edges.  Have any leaves, snow, or ice cleared regularly.  Use sand or salt on walking paths during winter.  Clean up any spills in your garage right away. This includes oil or grease spills. What can I do in the bathroom?  Use night lights.  Install grab bars by the toilet and in the tub and shower. Do not use towel bars as grab bars.  Use non-skid mats or decals in the tub or shower.  If you need to sit down in the shower, use a plastic, non-slip stool.  Keep the floor dry. Clean up  any water that spills on the floor as soon as it happens.  Remove soap buildup in the tub or shower regularly.  Attach bath mats securely with double-sided non-slip rug tape.  Do not have throw rugs and other things on the floor that can make you trip. What can I do in the bedroom?  Use night lights.  Make sure that you have a light by your bed that is easy to reach.  Do not use any sheets or blankets that are too big for your bed. They should not hang down onto the floor.  Have a firm chair that has side arms. You can use this for support while you get dressed.  Do not have throw rugs and other things on the floor that can make you trip. What can I do in the kitchen?  Clean up any spills right away.  Avoid walking on wet floors.  Keep items that you use a lot in easy-to-reach places.  If you need to reach something above you, use a strong step stool that has a grab bar.  Keep electrical cords out of the way.  Do not use floor polish or wax that makes floors slippery. If you must use wax, use non-skid floor wax.  Do not  have throw rugs and other things on the floor that can make you trip. What can I do with my stairs?  Do not leave any items on the stairs.  Make sure that there are handrails on both sides of the stairs and use them. Fix handrails that are broken or loose. Make sure that handrails are as long as the stairways.  Check any carpeting to make sure that it is firmly attached to the stairs. Fix any carpet that is loose or worn.  Avoid having throw rugs at the top or bottom of the stairs. If you do have throw rugs, attach them to the floor with carpet tape.  Make sure that you have a light switch at the top of the stairs and the bottom of the stairs. If you do not have them, ask someone to add them for you. What else can I do to help prevent falls?  Wear shoes that:  Do not have high heels.  Have rubber bottoms.  Are comfortable and fit you well.  Are  closed at the toe. Do not wear sandals.  If you use a stepladder:  Make sure that it is fully opened. Do not climb a closed stepladder.  Make sure that both sides of the stepladder are locked into place.  Ask someone to hold it for you, if possible.  Clearly mark and make sure that you can see:  Any grab bars or handrails.  First and last steps.  Where the edge of each step is.  Use tools that help you move around (mobility aids) if they are needed. These include:  Canes.  Walkers.  Scooters.  Crutches.  Turn on the lights when you go into a dark area. Replace any light bulbs as soon as they burn out.  Set up your furniture so you have a clear path. Avoid moving your furniture around.  If any of your floors are uneven, fix them.  If there are any pets around you, be aware of where they are.  Review your medicines with your doctor. Some medicines can make you feel dizzy. This can increase your chance of falling. Ask your doctor what other things that you can do to help prevent falls. This information is not intended to replace advice given to you by your health care provider. Make sure you discuss any questions you have with your health care provider. Document Released: 01/04/2009 Document Revised: 08/16/2015 Document Reviewed: 04/14/2014 Elsevier Interactive Patient Education  2017 [employer].

## 2019-12-21 NOTE — Addendum Note (Signed)
Addended by: Howard Pouch A on: 12/21/2019 04:47 PM   Modules accepted: Orders

## 2019-12-21 NOTE — Telephone Encounter (Signed)
Patient states she is not happy with her current GYN. She wants to know if you are able to prescribe her HRT. If not she would like a referral to a new GYN.

## 2019-12-21 NOTE — Telephone Encounter (Signed)
I have placed referral for her to a new gynecologist to discuss hormone replacement therapy.

## 2020-01-03 DIAGNOSIS — M26629 Arthralgia of temporomandibular joint, unspecified side: Secondary | ICD-10-CM | POA: Diagnosis not present

## 2020-01-03 DIAGNOSIS — M9900 Segmental and somatic dysfunction of head region: Secondary | ICD-10-CM | POA: Diagnosis not present

## 2020-01-03 DIAGNOSIS — G500 Trigeminal neuralgia: Secondary | ICD-10-CM | POA: Diagnosis not present

## 2020-01-03 DIAGNOSIS — M9901 Segmental and somatic dysfunction of cervical region: Secondary | ICD-10-CM | POA: Diagnosis not present

## 2020-01-14 DIAGNOSIS — M542 Cervicalgia: Secondary | ICD-10-CM | POA: Diagnosis not present

## 2020-01-30 DIAGNOSIS — M5481 Occipital neuralgia: Secondary | ICD-10-CM | POA: Diagnosis not present

## 2020-01-30 DIAGNOSIS — M62838 Other muscle spasm: Secondary | ICD-10-CM | POA: Diagnosis not present

## 2020-01-31 DIAGNOSIS — M25552 Pain in left hip: Secondary | ICD-10-CM | POA: Diagnosis not present

## 2020-01-31 DIAGNOSIS — M9906 Segmental and somatic dysfunction of lower extremity: Secondary | ICD-10-CM | POA: Diagnosis not present

## 2020-01-31 DIAGNOSIS — M9908 Segmental and somatic dysfunction of rib cage: Secondary | ICD-10-CM | POA: Diagnosis not present

## 2020-01-31 DIAGNOSIS — M7631 Iliotibial band syndrome, right leg: Secondary | ICD-10-CM | POA: Diagnosis not present

## 2020-01-31 DIAGNOSIS — R0781 Pleurodynia: Secondary | ICD-10-CM | POA: Diagnosis not present

## 2020-01-31 DIAGNOSIS — M62838 Other muscle spasm: Secondary | ICD-10-CM | POA: Diagnosis not present

## 2020-01-31 DIAGNOSIS — M9907 Segmental and somatic dysfunction of upper extremity: Secondary | ICD-10-CM | POA: Diagnosis not present

## 2020-01-31 DIAGNOSIS — M9905 Segmental and somatic dysfunction of pelvic region: Secondary | ICD-10-CM | POA: Diagnosis not present

## 2020-02-02 DIAGNOSIS — M545 Low back pain, unspecified: Secondary | ICD-10-CM | POA: Diagnosis not present

## 2020-02-02 DIAGNOSIS — M50323 Other cervical disc degeneration at C6-C7 level: Secondary | ICD-10-CM | POA: Diagnosis not present

## 2020-02-02 DIAGNOSIS — M26621 Arthralgia of right temporomandibular joint: Secondary | ICD-10-CM | POA: Diagnosis not present

## 2020-02-02 DIAGNOSIS — M26622 Arthralgia of left temporomandibular joint: Secondary | ICD-10-CM | POA: Diagnosis not present

## 2020-02-14 ENCOUNTER — Encounter: Payer: Self-pay | Admitting: Family Medicine

## 2020-02-15 ENCOUNTER — Other Ambulatory Visit: Payer: Self-pay | Admitting: Obstetrics and Gynecology

## 2020-02-15 ENCOUNTER — Ambulatory Visit: Payer: Medicare Other | Admitting: Certified Nurse Midwife

## 2020-02-15 DIAGNOSIS — N951 Menopausal and female climacteric states: Secondary | ICD-10-CM

## 2020-02-15 MED ORDER — ESTRADIOL 0.05 MG/24HR TD PTWK: 0.0500 mg | MEDICATED_PATCH | TRANSDERMAL | 0 refills | Status: DC

## 2020-02-15 NOTE — Telephone Encounter (Signed)
Patient is asking for refill of estradiol patch to [facility], [place], [place]. Patient asked for 1 refill until her appointment with her new gyn 02/22/2020.

## 2020-02-15 NOTE — Telephone Encounter (Signed)
Medication refill request: Estradiol patch Last AEX:  02/14/19 Next AEX: scheduling with new Gyn 02/22/20 Last MMG (if hormonal medication request): 11/04/19  Neg  Refill authorized: 4/0

## 2020-02-22 ENCOUNTER — Ambulatory Visit: Payer: Medicare Other | Admitting: Obstetrics and Gynecology

## 2020-02-22 ENCOUNTER — Encounter: Payer: Self-pay | Admitting: Obstetrics and Gynecology

## 2020-02-22 ENCOUNTER — Other Ambulatory Visit: Payer: Self-pay

## 2020-02-22 VITALS — BP 118/80 | Ht 68.0 in | Wt 219.0 lb

## 2020-02-22 DIAGNOSIS — Z780 Asymptomatic menopausal state: Secondary | ICD-10-CM

## 2020-02-22 DIAGNOSIS — Z01419 Encounter for gynecological examination (general) (routine) without abnormal findings: Secondary | ICD-10-CM | POA: Diagnosis not present

## 2020-02-22 DIAGNOSIS — Z13820 Encounter for screening for osteoporosis: Secondary | ICD-10-CM

## 2020-02-22 DIAGNOSIS — N951 Menopausal and female climacteric states: Secondary | ICD-10-CM

## 2020-02-22 DIAGNOSIS — N812 Incomplete uterovaginal prolapse: Secondary | ICD-10-CM | POA: Diagnosis not present

## 2020-02-22 DIAGNOSIS — N952 Postmenopausal atrophic vaginitis: Secondary | ICD-10-CM

## 2020-02-22 MED ORDER — ESTRADIOL 0.05 MG/24HR TD PTWK: 0.0500 mg | MEDICATED_PATCH | TRANSDERMAL | 12 refills | Status: DC

## 2020-02-22 MED ORDER — ESTROGENS, CONJUGATED 0.625 MG/GM VA CREA: TOPICAL_CREAM | VAGINAL | 2 refills | Status: DC

## 2020-02-22 NOTE — Progress Notes (Signed)
Diana Gross 1956-02-14 [id number]  SUBJECTIVE:  64 y.o. G87P1001 female new patient here for a breast and pelvic exam. Prior hysterectomy for benign indications. Has used ERT for relief of menopausal symptoms but also finds that it has helped her trigeminal neuralgia.  She does share an excerpt from reported researching physician at [facility] who specializes in the study of these neurologic conditions stating that hormone therapy would be beneficial.   She also uses vaginal estrogen cream with pelvic organ prolapse/cystocele and has found that this is reduce recurrence of UTIs.   Current Outpatient Medications  Medication Sig Dispense Refill  . ALPRAZolam (XANAX) 1 MG tablet Takes 1/2 to 1 tablet up to 3 times a day  0  . Cholecalciferol (D3 SUPER STRENGTH) 2000 units CAPS Take 2,000 Units by mouth daily.    Marland Kitchen conjugated estrogens (PREMARIN) vaginal cream Place 1/2 gram vaginally at bedtime two three times a week. Do not overuse. 30 g 2  . diphenhydrAMINE (BENADRYL) 25 MG tablet Take 25 mg by mouth as needed.    . docusate sodium (COLACE) 100 MG capsule Take 100 mg by mouth 2 (two) times daily.    Marland Kitchen estradiol (CLIMARA - DOSED IN MG/24 HR) 0.05 mg/24hr patch Place 1 patch (0.05 mg total) onto the skin once a week. 4 patch 0  . famotidine (PEPCID) 10 MG tablet Take 10 mg by mouth 2 (two) times daily.    . fexofenadine (ALLEGRA) 180 MG tablet Take 180 mg by mouth as needed.     . fluticasone (FLONASE) 50 MCG/ACT nasal spray Place into both nostrils as needed.     Marland Kitchen L-Theanine 100 MG CAPS Take 2 capsules by mouth daily.    . magnesium 30 MG tablet Take by mouth.     [name] 4 MG/0.1ML LIQD nasal spray kit     . NON FORMULARY 15 mg. Hempworks 500 - 73m hemp oil 7576m   . NON FORMULARY 1 tablet daily. collagen    . ondansetron (ZOFRAN) 4 MG tablet Take 1 tablet (4 mg total) by mouth every 8 (eight) hours as needed for nausea or vomiting. 20 tablet 0  . ondansetron (ZOFRAN-ODT) 4 MG  disintegrating tablet Take 4 mg by mouth every 4 (four) hours as needed for nausea or vomiting.     . Marland KitchenxyCODONE (ROXICODONE) 15 MG immediate release tablet Take 1 tablet by mouth every 4 (four) hours.  0  . OXYCONTIN 15 MG 12 hr tablet Take 45 mg by mouth.   0  . SUMAtriptan (IMITREX) 50 MG tablet as needed.   0  . tiZANidine (ZANAFLEX) 4 MG tablet Takes 1/2 when needed     No current facility-administered medications for this visit.   Allergies: Amitriptyline, Baclofen, Bupropion, Clonazepam, Duloxetine, Fluoxetine, Gabapentin, Oxcarbazepine, Paroxetine hcl, Penicillins, Tapentadol, Topiramate, Tramadol, Carbamazepine, Carisoprodol, Ibuprofen, Metaxalone, Morphine, Naproxen sodium, Oxymorphone, Augmentin [amoxicillin-pot clavulanate], Decadron  [dexamethasone], Fentanyl, Keflex [cephalexin], Pregabalin, Prozac [fluoxetine hcl], Ativan [lorazepam], Celebrex [celecoxib], Cymbalta [duloxetine hcl], Flexeril [cyclobenzaprine], Nortriptyline, Opana [oxymorphone hcl], Valium [diazepam], Vimpat [lacosamide], and Zorvolex [diclofenac]   No LMP recorded (lmp unknown). Patient has had a hysterectomy.  Past medical history,surgical history, problem list, medications, allergies, family history and social history were all reviewed and documented as reviewed in the EPIC chart.  GYN ROS: no abnormal bleeding, pelvic pain or discharge, no breast pain or new or enlarging lumps on self exam.  No dysuria, frequency, burning, pain with urination, cloudy/malodorous urine.   OBJECTIVE:  BP 118/80 (  BP Location: Right Arm, Patient Position: Sitting, Cuff Size: Large)   Ht '5\' 8"'  (1.727 m)   Wt 219 lb (99.3 kg)   LMP  (LMP Unknown)   BMI 33.30 kg/m  The patient appears well, alert, oriented, in no distress.  BREAST EXAM: breasts appear normal, no suspicious masses, no skin or nipple changes or axillary nodes  PELVIC EXAM: VULVA: normal appearing vulva with atrophic change, no masses, tenderness or lesions,  VAGINA: Grade 1 cystocele, grade 1 vaginal vault prolapse, grade 2-3 to seal, otherwise normal appearing vagina with atrophic change, normal color and discharge, 1 cm and 2 cm smooth and benign-appearing vaginal polyps at the left vaginal introitus, no other lesions, CERVIX: surgically absent, UTERUS: surgically absent, vaginal cuff normal, ADNEXA: no masses, nontender  Chaperone: Wandra Scot Bonham present during the examination  ASSESSMENT:  64 y.o. G1P1001 here for a breast and pelvic exam, discussion of ERT  PLAN:   1. Postmenopausal/ERT. Prior hysterectomy for benign indications.  As above, she has used ERT, Climara 0.05 mg patches twice weekly for control of vasomotor symptoms and also her trigeminal neuralgia.  We had a discussion today again regarding the findings of the [facility] and the associated risks with estrogen use that are thought to increase with age and directly correlate with dosage.  Risks include thrombotic diseases such as heart attack, stroke, DVT, PE, and the breast cancer issue.  She also is using vaginal Premarin cream 2-3 times per week, and she wants to continue this as discussed below.  We discussed the additive effects with systemic absorption of estrogens from the cream would only add to the risks as mentioned above.  Taking all of that into count she does want to continue on both sources of estrogen.  She has tried to wean at least 2 separate times in the past 5 years down to a lower dose of the Climara patch and has had worsening trigeminal related pain and vasomotor symptoms.  So we will keep her on the dose 0.05 mg patches twice weekly, refill x1 year provided.  I encouraged her to consider weaning again in the next year or two. 2. Pap smear 2019.  Abnormal Pap smear in 1989 and had cryosurgery, then normal Pap smears since.  With the current screening guidelines based on age and prior hysterectomy, she is comfortable with not continuing Pap smears at this point. 3. 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  Relatively asymptomatic.  Keeps bowel movements regular and this helps.  Does have to mildly splint.  Not overly bothered by the symptoms.  She used to have recurrent UTIs but with use of vaginal estrogen cream she has resolved this problem.  We discussed weight loss can also improve that and any urinary issues.  Recommend monitoring the prolapse at her yearly exam, will let us know if symptoms get any more severe. 4. Mammogram 10/2019.  Normal breast exam today.  Will continue with annual mammograms. 5. Colonoscopy 2016.  She will follow up at the interval recommended by her GI specialist and primary care doctor.   6. DEXA 10 yrs ago. Next DEXA recommended this year so she plans to schedule this now as we can do them in this office.  She will plan to schedule at checkout. 7. Health maintenance.  No labs today as she normally has these completed with her primary care doctor.  Additional time was spent in today's encounter above and beyond the usual breast and pelvic  exam portion in discussing HRT and the pelvic organ prolapse.  Return annually or sooner, prn.  Joseph Pierini MD 02/22/20

## 2020-02-28 DIAGNOSIS — M26629 Arthralgia of temporomandibular joint, unspecified side: Secondary | ICD-10-CM | POA: Diagnosis not present

## 2020-02-28 DIAGNOSIS — M9901 Segmental and somatic dysfunction of cervical region: Secondary | ICD-10-CM | POA: Diagnosis not present

## 2020-02-28 DIAGNOSIS — M542 Cervicalgia: Secondary | ICD-10-CM | POA: Diagnosis not present

## 2020-02-28 DIAGNOSIS — M9900 Segmental and somatic dysfunction of head region: Secondary | ICD-10-CM | POA: Diagnosis not present

## 2020-03-01 ENCOUNTER — Ambulatory Visit (INDEPENDENT_AMBULATORY_CARE_PROVIDER_SITE_OTHER): Payer: Medicare Other

## 2020-03-01 ENCOUNTER — Other Ambulatory Visit: Payer: Self-pay

## 2020-03-01 ENCOUNTER — Other Ambulatory Visit: Payer: Self-pay | Admitting: Obstetrics and Gynecology

## 2020-03-01 DIAGNOSIS — Z01419 Encounter for gynecological examination (general) (routine) without abnormal findings: Secondary | ICD-10-CM

## 2020-03-01 DIAGNOSIS — M85852 Other specified disorders of bone density and structure, left thigh: Secondary | ICD-10-CM

## 2020-03-01 DIAGNOSIS — Z780 Asymptomatic menopausal state: Secondary | ICD-10-CM | POA: Diagnosis not present

## 2020-03-01 DIAGNOSIS — Z13820 Encounter for screening for osteoporosis: Secondary | ICD-10-CM

## 2020-03-08 DIAGNOSIS — G501 Atypical facial pain: Secondary | ICD-10-CM | POA: Diagnosis not present

## 2020-03-29 DIAGNOSIS — M5481 Occipital neuralgia: Secondary | ICD-10-CM | POA: Diagnosis not present

## 2020-03-29 DIAGNOSIS — M47812 Spondylosis without myelopathy or radiculopathy, cervical region: Secondary | ICD-10-CM | POA: Diagnosis not present

## 2020-03-29 DIAGNOSIS — M62838 Other muscle spasm: Secondary | ICD-10-CM | POA: Diagnosis not present

## 2020-03-29 DIAGNOSIS — M542 Cervicalgia: Secondary | ICD-10-CM | POA: Diagnosis not present

## 2020-04-03 DIAGNOSIS — M9900 Segmental and somatic dysfunction of head region: Secondary | ICD-10-CM | POA: Diagnosis not present

## 2020-04-03 DIAGNOSIS — M9901 Segmental and somatic dysfunction of cervical region: Secondary | ICD-10-CM | POA: Diagnosis not present

## 2020-04-03 DIAGNOSIS — M542 Cervicalgia: Secondary | ICD-10-CM | POA: Diagnosis not present

## 2020-04-03 DIAGNOSIS — M26629 Arthralgia of temporomandibular joint, unspecified side: Secondary | ICD-10-CM | POA: Diagnosis not present

## 2020-05-01 DIAGNOSIS — H524 Presbyopia: Secondary | ICD-10-CM | POA: Diagnosis not present

## 2020-05-08 DIAGNOSIS — M9901 Segmental and somatic dysfunction of cervical region: Secondary | ICD-10-CM | POA: Diagnosis not present

## 2020-05-08 DIAGNOSIS — G500 Trigeminal neuralgia: Secondary | ICD-10-CM | POA: Diagnosis not present

## 2020-05-08 DIAGNOSIS — M9900 Segmental and somatic dysfunction of head region: Secondary | ICD-10-CM | POA: Diagnosis not present

## 2020-05-08 DIAGNOSIS — M26629 Arthralgia of temporomandibular joint, unspecified side: Secondary | ICD-10-CM | POA: Diagnosis not present

## 2020-05-10 DIAGNOSIS — M5481 Occipital neuralgia: Secondary | ICD-10-CM | POA: Diagnosis not present

## 2020-05-10 DIAGNOSIS — M62838 Other muscle spasm: Secondary | ICD-10-CM | POA: Diagnosis not present

## 2020-05-22 DIAGNOSIS — M5417 Radiculopathy, lumbosacral region: Secondary | ICD-10-CM | POA: Diagnosis not present

## 2020-05-22 DIAGNOSIS — M47816 Spondylosis without myelopathy or radiculopathy, lumbar region: Secondary | ICD-10-CM | POA: Diagnosis not present

## 2020-05-22 DIAGNOSIS — M5126 Other intervertebral disc displacement, lumbar region: Secondary | ICD-10-CM | POA: Diagnosis not present

## 2020-05-22 DIAGNOSIS — M5136 Other intervertebral disc degeneration, lumbar region: Secondary | ICD-10-CM | POA: Diagnosis not present

## 2020-05-24 ENCOUNTER — Other Ambulatory Visit: Payer: Self-pay | Admitting: Family

## 2020-05-24 DIAGNOSIS — M5136 Other intervertebral disc degeneration, lumbar region: Secondary | ICD-10-CM

## 2020-05-24 DIAGNOSIS — Z6832 Body mass index (BMI) 32.0-32.9, adult: Secondary | ICD-10-CM | POA: Diagnosis not present

## 2020-05-24 DIAGNOSIS — M5417 Radiculopathy, lumbosacral region: Secondary | ICD-10-CM

## 2020-05-24 DIAGNOSIS — M5416 Radiculopathy, lumbar region: Secondary | ICD-10-CM | POA: Insufficient documentation

## 2020-05-24 DIAGNOSIS — R030 Elevated blood-pressure reading, without diagnosis of hypertension: Secondary | ICD-10-CM | POA: Diagnosis not present

## 2020-05-24 DIAGNOSIS — M5126 Other intervertebral disc displacement, lumbar region: Secondary | ICD-10-CM

## 2020-05-24 DIAGNOSIS — M51369 Other intervertebral disc degeneration, lumbar region without mention of lumbar back pain or lower extremity pain: Secondary | ICD-10-CM

## 2020-05-24 DIAGNOSIS — M47816 Spondylosis without myelopathy or radiculopathy, lumbar region: Secondary | ICD-10-CM

## 2020-05-29 DIAGNOSIS — M5417 Radiculopathy, lumbosacral region: Secondary | ICD-10-CM | POA: Diagnosis not present

## 2020-05-29 DIAGNOSIS — M5136 Other intervertebral disc degeneration, lumbar region: Secondary | ICD-10-CM | POA: Diagnosis not present

## 2020-05-29 DIAGNOSIS — G894 Chronic pain syndrome: Secondary | ICD-10-CM | POA: Diagnosis not present

## 2020-06-06 DIAGNOSIS — M5416 Radiculopathy, lumbar region: Secondary | ICD-10-CM | POA: Diagnosis not present

## 2020-06-06 DIAGNOSIS — M545 Low back pain, unspecified: Secondary | ICD-10-CM | POA: Diagnosis not present

## 2020-06-12 DIAGNOSIS — G500 Trigeminal neuralgia: Secondary | ICD-10-CM | POA: Diagnosis not present

## 2020-06-12 DIAGNOSIS — M9900 Segmental and somatic dysfunction of head region: Secondary | ICD-10-CM | POA: Diagnosis not present

## 2020-06-12 DIAGNOSIS — M26629 Arthralgia of temporomandibular joint, unspecified side: Secondary | ICD-10-CM | POA: Diagnosis not present

## 2020-06-12 DIAGNOSIS — M9901 Segmental and somatic dysfunction of cervical region: Secondary | ICD-10-CM | POA: Diagnosis not present

## 2020-06-13 DIAGNOSIS — M5416 Radiculopathy, lumbar region: Secondary | ICD-10-CM | POA: Diagnosis not present

## 2020-06-13 DIAGNOSIS — Z6833 Body mass index (BMI) 33.0-33.9, adult: Secondary | ICD-10-CM | POA: Diagnosis not present

## 2020-06-13 DIAGNOSIS — R030 Elevated blood-pressure reading, without diagnosis of hypertension: Secondary | ICD-10-CM | POA: Diagnosis not present

## 2020-06-21 DIAGNOSIS — M5126 Other intervertebral disc displacement, lumbar region: Secondary | ICD-10-CM | POA: Diagnosis not present

## 2020-07-17 DIAGNOSIS — M9900 Segmental and somatic dysfunction of head region: Secondary | ICD-10-CM | POA: Diagnosis not present

## 2020-07-17 DIAGNOSIS — M9901 Segmental and somatic dysfunction of cervical region: Secondary | ICD-10-CM | POA: Diagnosis not present

## 2020-07-17 DIAGNOSIS — G500 Trigeminal neuralgia: Secondary | ICD-10-CM | POA: Diagnosis not present

## 2020-07-17 DIAGNOSIS — M26629 Arthralgia of temporomandibular joint, unspecified side: Secondary | ICD-10-CM | POA: Diagnosis not present

## 2020-07-20 DIAGNOSIS — M9901 Segmental and somatic dysfunction of cervical region: Secondary | ICD-10-CM | POA: Diagnosis not present

## 2020-07-20 DIAGNOSIS — M26629 Arthralgia of temporomandibular joint, unspecified side: Secondary | ICD-10-CM | POA: Diagnosis not present

## 2020-07-20 DIAGNOSIS — M9900 Segmental and somatic dysfunction of head region: Secondary | ICD-10-CM | POA: Diagnosis not present

## 2020-07-20 DIAGNOSIS — G500 Trigeminal neuralgia: Secondary | ICD-10-CM | POA: Diagnosis not present

## 2020-07-25 DIAGNOSIS — F4481 Dissociative identity disorder: Secondary | ICD-10-CM | POA: Diagnosis not present

## 2020-07-25 DIAGNOSIS — F451 Undifferentiated somatoform disorder: Secondary | ICD-10-CM | POA: Diagnosis not present

## 2020-07-25 DIAGNOSIS — F331 Major depressive disorder, recurrent, moderate: Secondary | ICD-10-CM | POA: Diagnosis not present

## 2020-07-27 DIAGNOSIS — M62838 Other muscle spasm: Secondary | ICD-10-CM | POA: Diagnosis not present

## 2020-07-27 DIAGNOSIS — M5481 Occipital neuralgia: Secondary | ICD-10-CM | POA: Diagnosis not present

## 2020-07-27 DIAGNOSIS — M47816 Spondylosis without myelopathy or radiculopathy, lumbar region: Secondary | ICD-10-CM | POA: Diagnosis not present

## 2020-07-27 DIAGNOSIS — G894 Chronic pain syndrome: Secondary | ICD-10-CM | POA: Diagnosis not present

## 2020-08-07 DIAGNOSIS — F451 Undifferentiated somatoform disorder: Secondary | ICD-10-CM | POA: Diagnosis not present

## 2020-08-07 DIAGNOSIS — F4481 Dissociative identity disorder: Secondary | ICD-10-CM | POA: Diagnosis not present

## 2020-08-07 DIAGNOSIS — F331 Major depressive disorder, recurrent, moderate: Secondary | ICD-10-CM | POA: Diagnosis not present

## 2020-08-09 ENCOUNTER — Ambulatory Visit: Payer: Medicare Other | Admitting: Family Medicine

## 2020-08-16 DIAGNOSIS — F331 Major depressive disorder, recurrent, moderate: Secondary | ICD-10-CM | POA: Diagnosis not present

## 2020-08-16 DIAGNOSIS — F4481 Dissociative identity disorder: Secondary | ICD-10-CM | POA: Diagnosis not present

## 2020-08-16 DIAGNOSIS — F451 Undifferentiated somatoform disorder: Secondary | ICD-10-CM | POA: Diagnosis not present

## 2020-08-21 DIAGNOSIS — M9900 Segmental and somatic dysfunction of head region: Secondary | ICD-10-CM | POA: Diagnosis not present

## 2020-08-21 DIAGNOSIS — M9902 Segmental and somatic dysfunction of thoracic region: Secondary | ICD-10-CM | POA: Diagnosis not present

## 2020-08-21 DIAGNOSIS — M26629 Arthralgia of temporomandibular joint, unspecified side: Secondary | ICD-10-CM | POA: Diagnosis not present

## 2020-08-21 DIAGNOSIS — M9901 Segmental and somatic dysfunction of cervical region: Secondary | ICD-10-CM | POA: Diagnosis not present

## 2020-08-21 DIAGNOSIS — R0781 Pleurodynia: Secondary | ICD-10-CM | POA: Diagnosis not present

## 2020-08-21 DIAGNOSIS — G500 Trigeminal neuralgia: Secondary | ICD-10-CM | POA: Diagnosis not present

## 2020-08-21 DIAGNOSIS — M9908 Segmental and somatic dysfunction of rib cage: Secondary | ICD-10-CM | POA: Diagnosis not present

## 2020-08-21 DIAGNOSIS — M542 Cervicalgia: Secondary | ICD-10-CM | POA: Diagnosis not present

## 2020-08-30 DIAGNOSIS — F451 Undifferentiated somatoform disorder: Secondary | ICD-10-CM | POA: Diagnosis not present

## 2020-08-30 DIAGNOSIS — F4481 Dissociative identity disorder: Secondary | ICD-10-CM | POA: Diagnosis not present

## 2020-08-30 DIAGNOSIS — F331 Major depressive disorder, recurrent, moderate: Secondary | ICD-10-CM | POA: Diagnosis not present

## 2020-08-31 DIAGNOSIS — M62838 Other muscle spasm: Secondary | ICD-10-CM | POA: Diagnosis not present

## 2020-08-31 DIAGNOSIS — M5481 Occipital neuralgia: Secondary | ICD-10-CM | POA: Diagnosis not present

## 2020-09-07 DIAGNOSIS — F331 Major depressive disorder, recurrent, moderate: Secondary | ICD-10-CM | POA: Diagnosis not present

## 2020-09-07 DIAGNOSIS — F4481 Dissociative identity disorder: Secondary | ICD-10-CM | POA: Diagnosis not present

## 2020-09-07 DIAGNOSIS — F451 Undifferentiated somatoform disorder: Secondary | ICD-10-CM | POA: Diagnosis not present

## 2020-09-14 DIAGNOSIS — F4481 Dissociative identity disorder: Secondary | ICD-10-CM | POA: Diagnosis not present

## 2020-09-14 DIAGNOSIS — F451 Undifferentiated somatoform disorder: Secondary | ICD-10-CM | POA: Diagnosis not present

## 2020-09-14 DIAGNOSIS — F331 Major depressive disorder, recurrent, moderate: Secondary | ICD-10-CM | POA: Diagnosis not present

## 2020-09-18 DIAGNOSIS — M25551 Pain in right hip: Secondary | ICD-10-CM | POA: Diagnosis not present

## 2020-09-18 DIAGNOSIS — M9905 Segmental and somatic dysfunction of pelvic region: Secondary | ICD-10-CM | POA: Diagnosis not present

## 2020-09-18 DIAGNOSIS — M9904 Segmental and somatic dysfunction of sacral region: Secondary | ICD-10-CM | POA: Diagnosis not present

## 2020-09-18 DIAGNOSIS — M533 Sacrococcygeal disorders, not elsewhere classified: Secondary | ICD-10-CM | POA: Diagnosis not present

## 2020-09-21 DIAGNOSIS — F4481 Dissociative identity disorder: Secondary | ICD-10-CM | POA: Diagnosis not present

## 2020-09-21 DIAGNOSIS — F451 Undifferentiated somatoform disorder: Secondary | ICD-10-CM | POA: Diagnosis not present

## 2020-09-21 DIAGNOSIS — F331 Major depressive disorder, recurrent, moderate: Secondary | ICD-10-CM | POA: Diagnosis not present

## 2020-09-26 DIAGNOSIS — F331 Major depressive disorder, recurrent, moderate: Secondary | ICD-10-CM | POA: Diagnosis not present

## 2020-09-26 DIAGNOSIS — F4481 Dissociative identity disorder: Secondary | ICD-10-CM | POA: Diagnosis not present

## 2020-09-26 DIAGNOSIS — F451 Undifferentiated somatoform disorder: Secondary | ICD-10-CM | POA: Diagnosis not present

## 2020-09-27 DIAGNOSIS — M62838 Other muscle spasm: Secondary | ICD-10-CM | POA: Diagnosis not present

## 2020-09-27 DIAGNOSIS — M5481 Occipital neuralgia: Secondary | ICD-10-CM | POA: Diagnosis not present

## 2020-09-27 DIAGNOSIS — G894 Chronic pain syndrome: Secondary | ICD-10-CM | POA: Diagnosis not present

## 2020-10-10 DIAGNOSIS — F331 Major depressive disorder, recurrent, moderate: Secondary | ICD-10-CM | POA: Diagnosis not present

## 2020-10-10 DIAGNOSIS — F4481 Dissociative identity disorder: Secondary | ICD-10-CM | POA: Diagnosis not present

## 2020-10-10 DIAGNOSIS — F451 Undifferentiated somatoform disorder: Secondary | ICD-10-CM | POA: Diagnosis not present

## 2020-10-11 DIAGNOSIS — M62838 Other muscle spasm: Secondary | ICD-10-CM | POA: Diagnosis not present

## 2020-10-11 DIAGNOSIS — M5481 Occipital neuralgia: Secondary | ICD-10-CM | POA: Diagnosis not present

## 2020-10-17 DIAGNOSIS — F331 Major depressive disorder, recurrent, moderate: Secondary | ICD-10-CM | POA: Diagnosis not present

## 2020-10-17 DIAGNOSIS — F4481 Dissociative identity disorder: Secondary | ICD-10-CM | POA: Diagnosis not present

## 2020-10-17 DIAGNOSIS — F451 Undifferentiated somatoform disorder: Secondary | ICD-10-CM | POA: Diagnosis not present

## 2020-10-23 DIAGNOSIS — G500 Trigeminal neuralgia: Secondary | ICD-10-CM | POA: Diagnosis not present

## 2020-10-23 DIAGNOSIS — M26629 Arthralgia of temporomandibular joint, unspecified side: Secondary | ICD-10-CM | POA: Diagnosis not present

## 2020-10-23 DIAGNOSIS — M9900 Segmental and somatic dysfunction of head region: Secondary | ICD-10-CM | POA: Diagnosis not present

## 2020-10-23 DIAGNOSIS — M9901 Segmental and somatic dysfunction of cervical region: Secondary | ICD-10-CM | POA: Diagnosis not present

## 2020-10-26 DIAGNOSIS — F4481 Dissociative identity disorder: Secondary | ICD-10-CM | POA: Diagnosis not present

## 2020-10-26 DIAGNOSIS — F331 Major depressive disorder, recurrent, moderate: Secondary | ICD-10-CM | POA: Diagnosis not present

## 2020-10-26 DIAGNOSIS — F451 Undifferentiated somatoform disorder: Secondary | ICD-10-CM | POA: Diagnosis not present

## 2020-10-30 DIAGNOSIS — F451 Undifferentiated somatoform disorder: Secondary | ICD-10-CM | POA: Diagnosis not present

## 2020-10-30 DIAGNOSIS — F331 Major depressive disorder, recurrent, moderate: Secondary | ICD-10-CM | POA: Diagnosis not present

## 2020-10-30 DIAGNOSIS — F4481 Dissociative identity disorder: Secondary | ICD-10-CM | POA: Diagnosis not present

## 2020-11-06 DIAGNOSIS — F451 Undifferentiated somatoform disorder: Secondary | ICD-10-CM | POA: Diagnosis not present

## 2020-11-06 DIAGNOSIS — F331 Major depressive disorder, recurrent, moderate: Secondary | ICD-10-CM | POA: Diagnosis not present

## 2020-11-06 DIAGNOSIS — F4481 Dissociative identity disorder: Secondary | ICD-10-CM | POA: Diagnosis not present

## 2020-11-09 DIAGNOSIS — Z1231 Encounter for screening mammogram for malignant neoplasm of breast: Secondary | ICD-10-CM | POA: Diagnosis not present

## 2020-11-09 LAB — HM MAMMOGRAPHY

## 2020-11-12 ENCOUNTER — Encounter: Payer: Self-pay | Admitting: Nurse Practitioner

## 2020-11-12 ENCOUNTER — Encounter: Payer: Self-pay | Admitting: Family Medicine

## 2020-11-12 ENCOUNTER — Telehealth: Payer: Self-pay

## 2020-11-12 NOTE — Telephone Encounter (Signed)
Mammogram received and will be placed on providers desk.   Incomplete results. Additional imaging evaluation needed.

## 2020-11-12 NOTE — Telephone Encounter (Signed)
Report is on TW's desk.

## 2020-11-13 ENCOUNTER — Encounter: Payer: Self-pay | Admitting: Nurse Practitioner

## 2020-11-13 NOTE — Telephone Encounter (Signed)
Awaiting on provider to review results.

## 2020-11-16 DIAGNOSIS — F331 Major depressive disorder, recurrent, moderate: Secondary | ICD-10-CM | POA: Diagnosis not present

## 2020-11-16 DIAGNOSIS — F451 Undifferentiated somatoform disorder: Secondary | ICD-10-CM | POA: Diagnosis not present

## 2020-11-16 DIAGNOSIS — F4481 Dissociative identity disorder: Secondary | ICD-10-CM | POA: Diagnosis not present

## 2020-11-19 ENCOUNTER — Encounter: Payer: Self-pay | Admitting: Gynecology

## 2020-11-19 DIAGNOSIS — R928 Other abnormal and inconclusive findings on diagnostic imaging of breast: Secondary | ICD-10-CM | POA: Diagnosis not present

## 2020-11-19 DIAGNOSIS — R922 Inconclusive mammogram: Secondary | ICD-10-CM | POA: Diagnosis not present

## 2020-11-19 IMAGING — MR MR LUMBAR SPINE W/O CM
4 of 5 series · 18 of 48 positions shown · non-contrast
Comparison: Report of MRI lumbar spine 01/01/2001. Images are not
available.

CLINICAL DATA: Low back pain. G in both calves and left buttocks.
Occasional right foot numbness. Symptoms began 6 weeks ago.

EXAM:
MRI LUMBAR SPINE WITHOUT CONTRAST
TECHNIQUE: Multiplanar, multisequence MR imaging of the lumbar spine was
performed. No intravenous contrast was administered.

[Series 6: T2 · sagittal · 4.0mm · 0.73mm/px · 6 of 15 slices shown (1 of 2)]
[im 1/15]
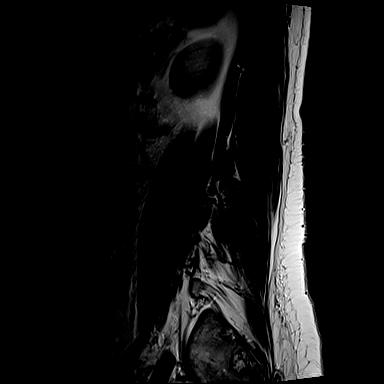
[im 3/15]
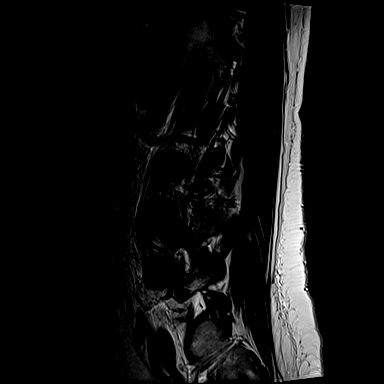
[im 6/15]
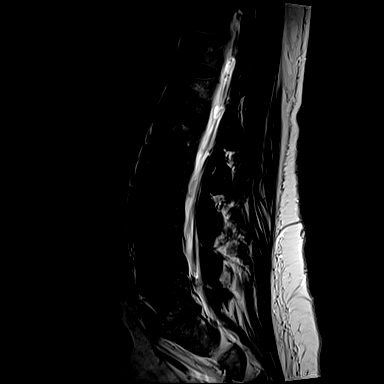
[im 9/15]
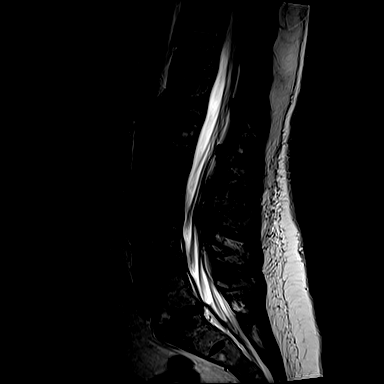
[im 12/15]
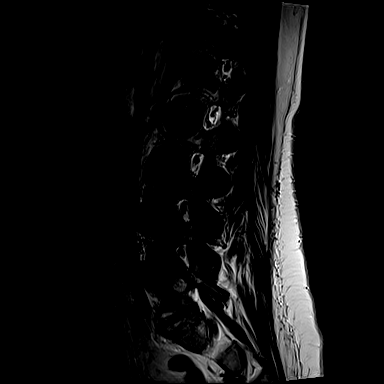
[im 15/15]
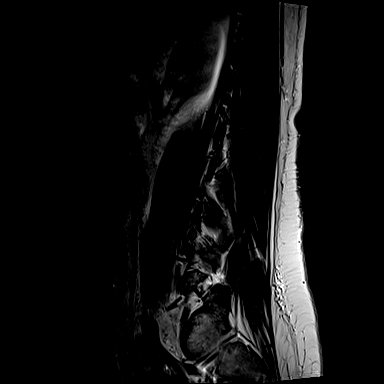

[Series 7: T1 · sagittal · 4.0mm · 0.73mm/px · 3 of 15 slices shown (1 of 2)]
[im 3/15]
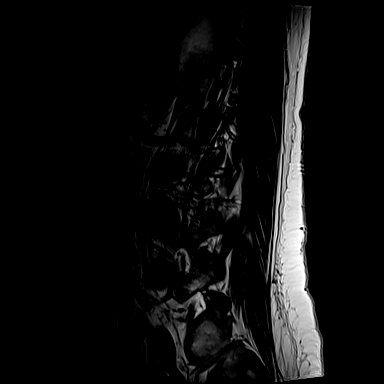
[im 9/15]
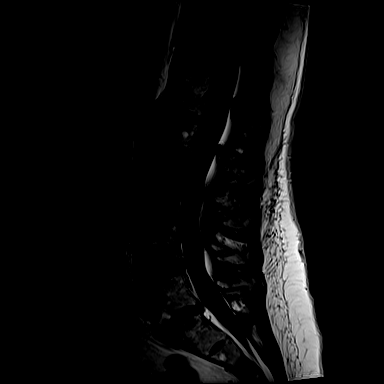
[im 15/15]
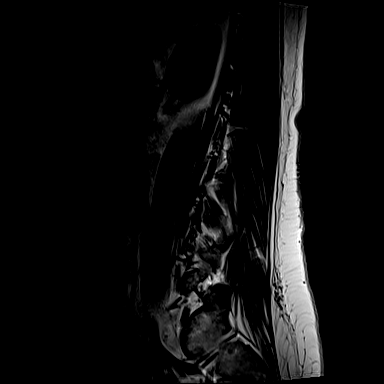

[Series 11: T1 · axial · 4.0mm · 0.28mm/px · z∈[-133,+44]mm · 3 of 41 slices shown (2 of 2)]
[im 6/41]
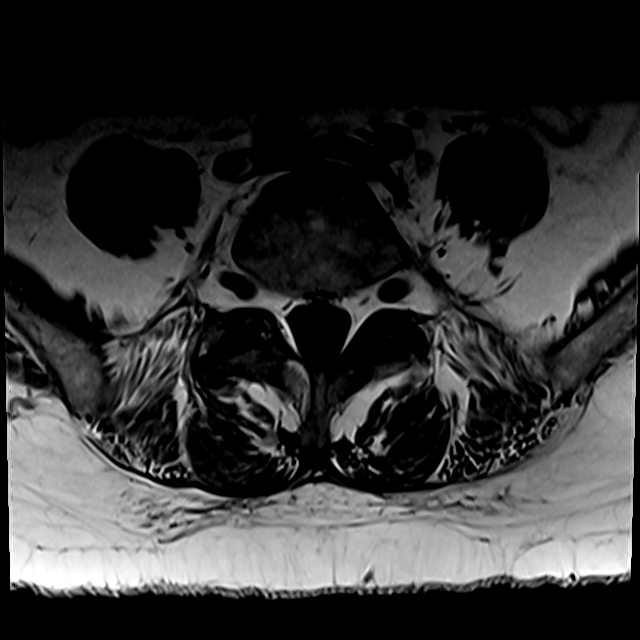
[im 21/41]
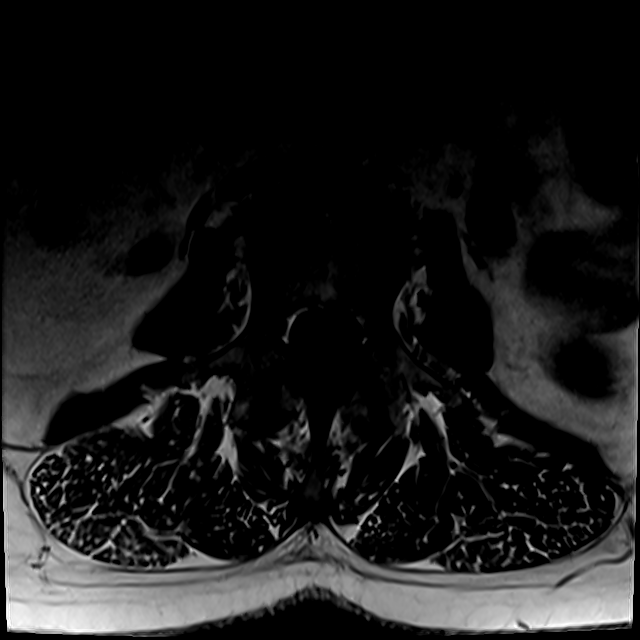
[im 35/41]
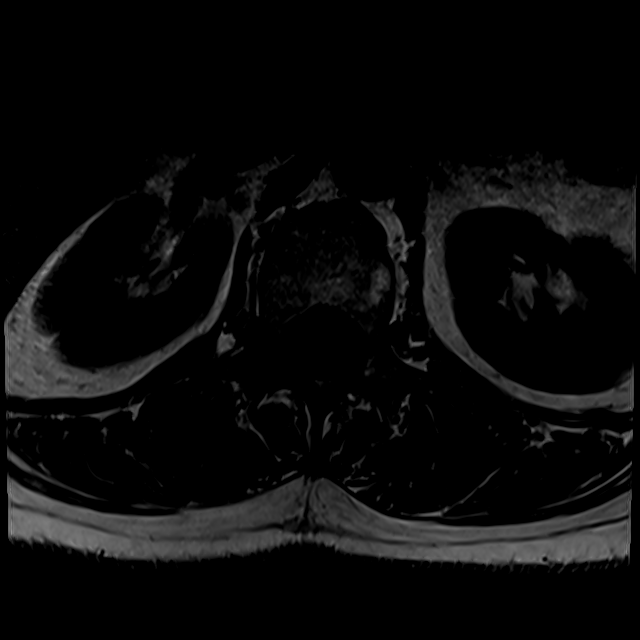

[Series 14: T2 · axial · 4.0mm · 0.28mm/px · z∈[-158,+44]mm · 6 of 41 slices shown (2 of 2)]
[im 1/41]
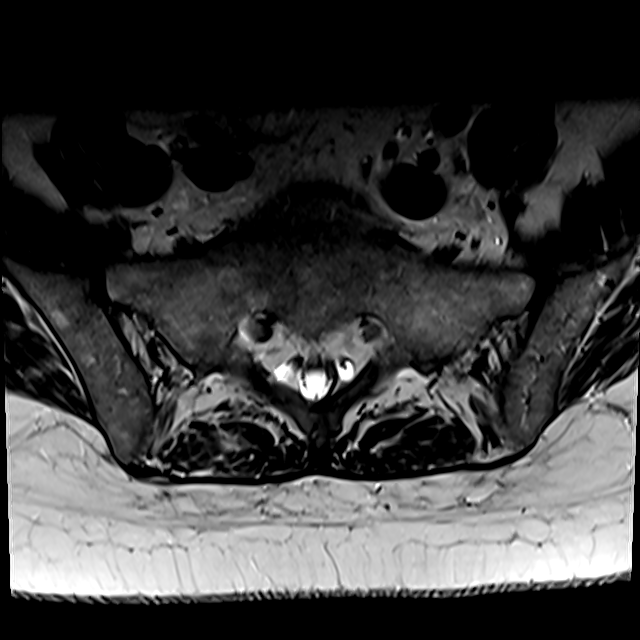
[im 6/41]
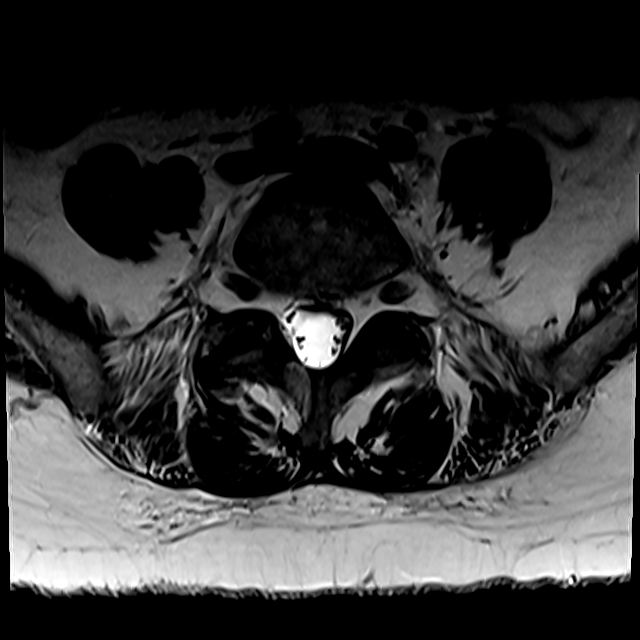
[im 12/41]
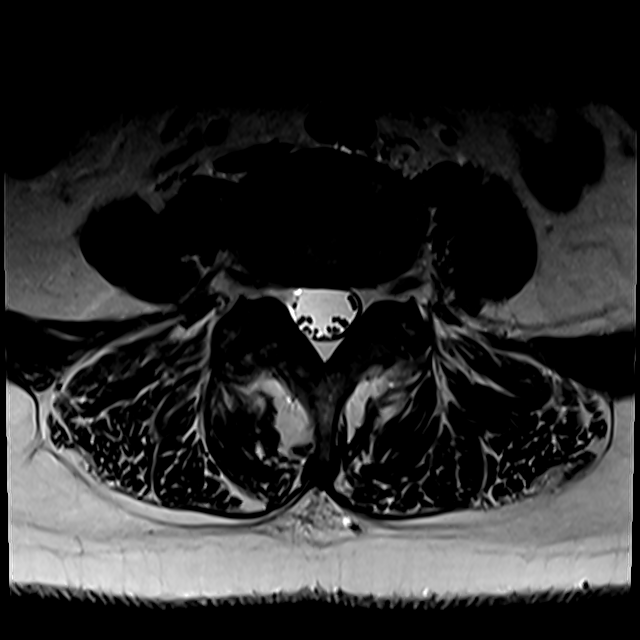
[im 18/41]
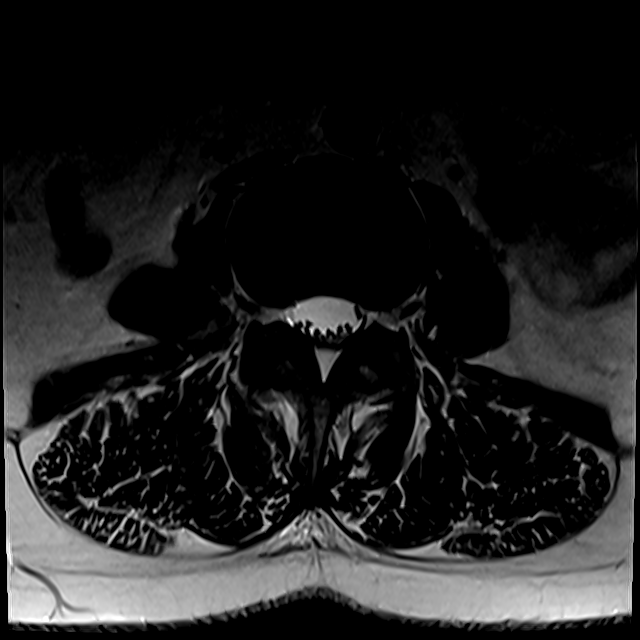
[im 21/41]
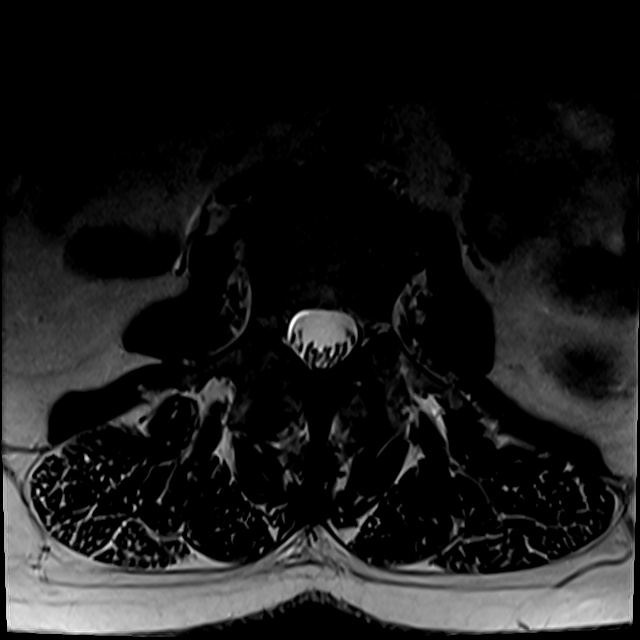
[im 35/41]
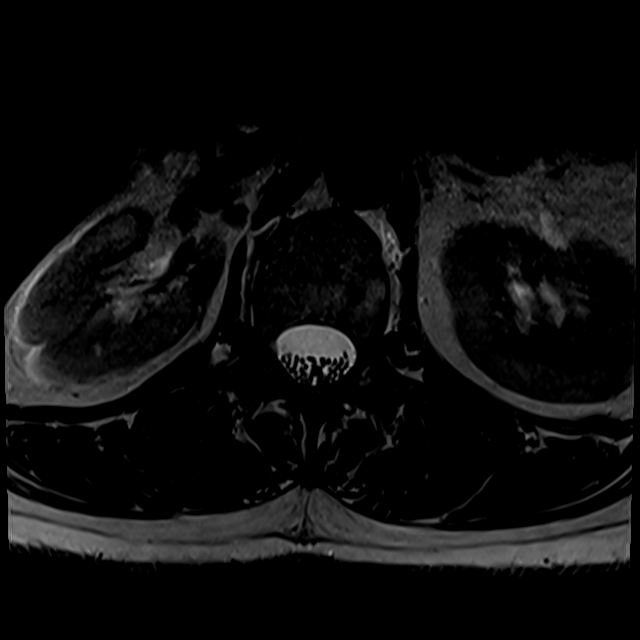

[18 of 48 positions shown; findings below may reference images not displayed]

FINDINGS: Segmentation: 5 non rib-bearing lumbar type vertebral bodies are
present. The lowest fully formed vertebral body is L5.

Alignment: Normal lumbar lordosis is present. No significant
listhesis is present. Mild leftward curvature is centered at L4.

Vertebrae: Fatty endplate marrow changes are noted anteriorly at
L5-S1.

Conus medullaris and cauda equina: Conus extends to the L1 level.
Conus and cauda equina appear normal.

Paraspinal and other soft tissues: Limited imaging the abdomen is
unremarkable. There is no significant adenopathy. No solid organ
lesions are present.

Disc levels:

L1-2: Normal.

L2-3: There is some desiccation of disc signal without significant
disc protrusion or stenosis.

L3-4: There is some desiccation of disc signal without significant
disc protrusion or stenosis.

L4-5: Mild facet hypertrophy is present bilaterally. No significant
disc protrusion or stenosis is present.

L5-S1: A left paramedian disc protrusion is present. There is
potential contact of the traversing left S1 nerve root. There is
chronic loss of disc height at this level. Foramina are patent.
IMPRESSION: 1. Left paramedian disc protrusion at L5-S1 with contact or
irritation of the traversing left S1 nerve roots.
2. Chronic loss of disc height and endplate change at L5-S1.
3. Mild facet hypertrophy at L4-5 without significant disc
protrusion or stenosis.

## 2020-11-22 DIAGNOSIS — F331 Major depressive disorder, recurrent, moderate: Secondary | ICD-10-CM | POA: Diagnosis not present

## 2020-11-22 DIAGNOSIS — F451 Undifferentiated somatoform disorder: Secondary | ICD-10-CM | POA: Diagnosis not present

## 2020-11-22 DIAGNOSIS — F4481 Dissociative identity disorder: Secondary | ICD-10-CM | POA: Diagnosis not present

## 2020-11-23 DIAGNOSIS — M5481 Occipital neuralgia: Secondary | ICD-10-CM | POA: Diagnosis not present

## 2020-11-23 DIAGNOSIS — M26621 Arthralgia of right temporomandibular joint: Secondary | ICD-10-CM | POA: Diagnosis not present

## 2020-11-23 DIAGNOSIS — G501 Atypical facial pain: Secondary | ICD-10-CM | POA: Diagnosis not present

## 2020-11-23 DIAGNOSIS — M26622 Arthralgia of left temporomandibular joint: Secondary | ICD-10-CM | POA: Diagnosis not present

## 2020-11-27 DIAGNOSIS — M26629 Arthralgia of temporomandibular joint, unspecified side: Secondary | ICD-10-CM | POA: Diagnosis not present

## 2020-11-27 DIAGNOSIS — M9900 Segmental and somatic dysfunction of head region: Secondary | ICD-10-CM | POA: Diagnosis not present

## 2020-11-27 DIAGNOSIS — M9901 Segmental and somatic dysfunction of cervical region: Secondary | ICD-10-CM | POA: Diagnosis not present

## 2020-11-27 DIAGNOSIS — G500 Trigeminal neuralgia: Secondary | ICD-10-CM | POA: Diagnosis not present

## 2020-11-29 DIAGNOSIS — F4481 Dissociative identity disorder: Secondary | ICD-10-CM | POA: Diagnosis not present

## 2020-11-29 DIAGNOSIS — F451 Undifferentiated somatoform disorder: Secondary | ICD-10-CM | POA: Diagnosis not present

## 2020-11-29 DIAGNOSIS — F331 Major depressive disorder, recurrent, moderate: Secondary | ICD-10-CM | POA: Diagnosis not present

## 2020-12-11 DIAGNOSIS — N390 Urinary tract infection, site not specified: Secondary | ICD-10-CM | POA: Diagnosis not present

## 2020-12-17 DIAGNOSIS — M5481 Occipital neuralgia: Secondary | ICD-10-CM | POA: Diagnosis not present

## 2020-12-17 DIAGNOSIS — M62838 Other muscle spasm: Secondary | ICD-10-CM | POA: Diagnosis not present

## 2021-01-01 DIAGNOSIS — M26629 Arthralgia of temporomandibular joint, unspecified side: Secondary | ICD-10-CM | POA: Diagnosis not present

## 2021-01-01 DIAGNOSIS — G500 Trigeminal neuralgia: Secondary | ICD-10-CM | POA: Diagnosis not present

## 2021-01-01 DIAGNOSIS — Z23 Encounter for immunization: Secondary | ICD-10-CM | POA: Diagnosis not present

## 2021-01-01 DIAGNOSIS — M7918 Myalgia, other site: Secondary | ICD-10-CM | POA: Diagnosis not present

## 2021-01-02 ENCOUNTER — Ambulatory Visit (INDEPENDENT_AMBULATORY_CARE_PROVIDER_SITE_OTHER): Payer: Medicare Other | Admitting: *Deleted

## 2021-01-02 DIAGNOSIS — Z0000 Encounter for general adult medical examination without abnormal findings: Secondary | ICD-10-CM | POA: Diagnosis not present

## 2021-01-02 DIAGNOSIS — Z1211 Encounter for screening for malignant neoplasm of colon: Secondary | ICD-10-CM
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
## 2021-01-02 NOTE — Patient Instructions (Signed)
[name] , Thank you for taking time to come for your Medicare Wellness Visit. I appreciate your ongoing commitment to your health goals. Please review the following plan we discussed and let me know if I can assist you in the future.   Screening recommendations/referrals: Colonoscopy: Education provided Mammogram: up to date Bone Density: up to date Recommended yearly ophthalmology/optometry visit for glaucoma screening and checkup Recommended yearly dental visit for hygiene and checkup  Vaccinations: Influenza vaccine: Education provided Pneumococcal vaccine: Education provided Tdap vaccine: up to date Shingles vaccine: Education provided    Advanced directives: Education provided  Conditions/risks identified:     Preventive Care 66 Years and Older, Female Preventive care refers to lifestyle choices and visits with your health care provider that can promote health and wellness. What does preventive care include? A yearly physical exam. This is also called an annual well check. Dental exams once or twice a year. Routine eye exams. Ask your health care provider how often you should have your eyes checked. Personal lifestyle choices, including: Daily care of your teeth and gums. Regular physical activity. Eating a healthy diet. Avoiding tobacco and drug use. Limiting alcohol use. Practicing safe sex. Taking low-dose aspirin every day. Taking vitamin and mineral supplements as recommended by your health care provider. What happens during an annual well check? The services and screenings done by your health care provider during your annual well check will depend on your age, overall health, lifestyle risk factors, and family history of disease. Counseling  Your health care provider may ask you questions about your: Alcohol use. Tobacco use. Drug use. Emotional well-being. Home and relationship well-being. Sexual activity. Eating habits. History of falls. Memory  and ability to understand (cognition). Work and work Statistician. Reproductive health. Screening  You may have the following tests or measurements: Height, weight, and BMI. Blood pressure. Lipid and cholesterol levels. These may be checked every 5 years, or more frequently if you are over 21 years old. Skin check. Lung cancer screening. You may have this screening every year starting at age 74 if you have a 30-pack-year history of smoking and currently smoke or have quit within the past 15 years. Fecal occult blood test (FOBT) of the stool. You may have this test every year starting at age 90. Flexible sigmoidoscopy or colonoscopy. You may have a sigmoidoscopy every 5 years or a colonoscopy every 10 years starting at age 90. Hepatitis C blood test. Hepatitis B blood test. Sexually transmitted disease (STD) testing. Diabetes screening. This is done by checking your blood sugar (glucose) after you have not eaten for a while (fasting). You may have this done every 1-3 years. Bone density scan. This is done to screen for osteoporosis. You may have this done starting at age 32. Mammogram. This may be done every 1-2 years. Talk to your health care provider about how often you should have regular mammograms. Talk with your health care provider about your test results, treatment options, and if necessary, the need for more tests. Vaccines  Your health care provider may recommend certain vaccines, such as: Influenza vaccine. This is recommended every year. Tetanus, diphtheria, and acellular pertussis (Tdap, Td) vaccine. You may need a Td booster every 10 years. Zoster vaccine. You may need this after age 33. Pneumococcal 13-valent conjugate (PCV13) vaccine. One dose is recommended after age 78. Pneumococcal polysaccharide (PPSV23) vaccine. One dose is recommended after age 73. Talk to your health care provider about which screenings and vaccines you need and how  often you need them. This  information is not intended to replace advice given to you by your health care provider. Make sure you discuss any questions you have with your health care provider. Document Released: 04/06/2015 Document Revised: 11/28/2015 Document Reviewed: 01/09/2015 Elsevier Interactive Patient Education  2017 [place] Prevention in the Home Falls can cause injuries. They can happen to people of all ages. There are many things you can do to make your home safe and to help prevent falls. What can I do on the outside of my home? Regularly fix the edges of walkways and driveways and fix any cracks. Remove anything that might make you trip as you walk through a door, such as a raised step or threshold. Trim any bushes or trees on the path to your home. Use bright outdoor lighting. Clear any walking paths of anything that might make someone trip, such as rocks or tools. Regularly check to see if handrails are loose or broken. Make sure that both sides of any steps have handrails. Any raised decks and porches should have guardrails on the edges. Have any leaves, snow, or ice cleared regularly. Use sand or salt on walking paths during winter. Clean up any spills in your garage right away. This includes oil or grease spills. What can I do in the bathroom? Use night lights. Install grab bars by the toilet and in the tub and shower. Do not use towel bars as grab bars. Use non-skid mats or decals in the tub or shower. If you need to sit down in the shower, use a plastic, non-slip stool. Keep the floor dry. Clean up any water that spills on the floor as soon as it happens. Remove soap buildup in the tub or shower regularly. Attach bath mats securely with double-sided non-slip rug tape. Do not have throw rugs and other things on the floor that can make you trip. What can I do in the bedroom? Use night lights. Make sure that you have a light by your bed that is easy to reach. Do not use any sheets or  blankets that are too big for your bed. They should not hang down onto the floor. Have a firm chair that has side arms. You can use this for support while you get dressed. Do not have throw rugs and other things on the floor that can make you trip. What can I do in the kitchen? Clean up any spills right away. Avoid walking on wet floors. Keep items that you use a lot in easy-to-reach places. If you need to reach something above you, use a strong step stool that has a grab bar. Keep electrical cords out of the way. Do not use floor polish or wax that makes floors slippery. If you must use wax, use non-skid floor wax. Do not have throw rugs and other things on the floor that can make you trip. What can I do with my stairs? Do not leave any items on the stairs. Make sure that there are handrails on both sides of the stairs and use them. Fix handrails that are broken or loose. Make sure that handrails are as long as the stairways. Check any carpeting to make sure that it is firmly attached to the stairs. Fix any carpet that is loose or worn. Avoid having throw rugs at the top or bottom of the stairs. If you do have throw rugs, attach them to the floor with carpet tape. Make sure that you have a  light switch at the top of the stairs and the bottom of the stairs. If you do not have them, ask someone to add them for you. What else can I do to help prevent falls? Wear shoes that: Do not have high heels. Have rubber bottoms. Are comfortable and fit you well. Are closed at the toe. Do not wear sandals. If you use a stepladder: Make sure that it is fully opened. Do not climb a closed stepladder. Make sure that both sides of the stepladder are locked into place. Ask someone to hold it for you, if possible. Clearly mark and make sure that you can see: Any grab bars or handrails. First and last steps. Where the edge of each step is. Use tools that help you move around (mobility aids) if they are  needed. These include: Canes. Walkers. Scooters. Crutches. Turn on the lights when you go into a dark area. Replace any light bulbs as soon as they burn out. Set up your furniture so you have a clear path. Avoid moving your furniture around. If any of your floors are uneven, fix them. If there are any pets around you, be aware of where they are. Review your medicines with your doctor. Some medicines can make you feel dizzy. This can increase your chance of falling. Ask your doctor what other things that you can do to help prevent falls. This information is not intended to replace advice given to you by your health care provider. Make sure you discuss any questions you have with your health care provider. Document Released: 01/04/2009 Document Revised: 08/16/2015 Document Reviewed: 04/14/2014 Elsevier Interactive Patient Education  2017 [employer].

## 2021-01-06 DIAGNOSIS — M9901 Segmental and somatic dysfunction of cervical region: Secondary | ICD-10-CM | POA: Diagnosis not present

## 2021-01-06 DIAGNOSIS — M9903 Segmental and somatic dysfunction of lumbar region: Secondary | ICD-10-CM | POA: Diagnosis not present

## 2021-01-06 DIAGNOSIS — M9908 Segmental and somatic dysfunction of rib cage: Secondary | ICD-10-CM | POA: Diagnosis not present

## 2021-01-06 DIAGNOSIS — M9900 Segmental and somatic dysfunction of head region: Secondary | ICD-10-CM | POA: Diagnosis not present

## 2021-01-17 DIAGNOSIS — Z1211 Encounter for screening for malignant neoplasm of colon: Secondary | ICD-10-CM | POA: Diagnosis not present

## 2021-01-23 DIAGNOSIS — G501 Atypical facial pain: Secondary | ICD-10-CM | POA: Diagnosis not present

## 2021-01-23 DIAGNOSIS — G894 Chronic pain syndrome: Secondary | ICD-10-CM | POA: Diagnosis not present

## 2021-01-23 DIAGNOSIS — M5481 Occipital neuralgia: Secondary | ICD-10-CM | POA: Diagnosis not present

## 2021-01-23 DIAGNOSIS — M62838 Other muscle spasm: Secondary | ICD-10-CM | POA: Diagnosis not present

## 2021-01-25 LAB — COLOGUARD: COLOGUARD: NEGATIVE

## 2021-02-05 DIAGNOSIS — M26629 Arthralgia of temporomandibular joint, unspecified side: Secondary | ICD-10-CM | POA: Diagnosis not present

## 2021-02-05 DIAGNOSIS — M9900 Segmental and somatic dysfunction of head region: Secondary | ICD-10-CM | POA: Diagnosis not present

## 2021-02-05 DIAGNOSIS — G500 Trigeminal neuralgia: Secondary | ICD-10-CM | POA: Diagnosis not present

## 2021-02-05 DIAGNOSIS — M9901 Segmental and somatic dysfunction of cervical region: Secondary | ICD-10-CM | POA: Diagnosis not present

## 2021-02-21 DIAGNOSIS — F4481 Dissociative identity disorder: Secondary | ICD-10-CM | POA: Diagnosis not present

## 2021-02-21 DIAGNOSIS — F451 Undifferentiated somatoform disorder: Secondary | ICD-10-CM | POA: Diagnosis not present

## 2021-02-21 DIAGNOSIS — F331 Major depressive disorder, recurrent, moderate: Secondary | ICD-10-CM | POA: Diagnosis not present

## 2021-02-25 ENCOUNTER — Encounter: Payer: Self-pay | Admitting: Nurse Practitioner

## 2021-02-25 ENCOUNTER — Other Ambulatory Visit: Payer: Self-pay

## 2021-02-25 ENCOUNTER — Ambulatory Visit (INDEPENDENT_AMBULATORY_CARE_PROVIDER_SITE_OTHER): Payer: Medicare Other | Admitting: Nurse Practitioner

## 2021-02-25 VITALS — BP 136/78 | HR 74 | Ht 68.0 in | Wt 209.0 lb

## 2021-02-25 DIAGNOSIS — Z79890 Hormone replacement therapy (postmenopausal): Secondary | ICD-10-CM

## 2021-02-25 DIAGNOSIS — N8110 Cystocele, unspecified: Secondary | ICD-10-CM | POA: Diagnosis not present

## 2021-02-25 DIAGNOSIS — Z780 Asymptomatic menopausal state: Secondary | ICD-10-CM

## 2021-02-25 DIAGNOSIS — N816 Rectocele: Secondary | ICD-10-CM

## 2021-02-25 DIAGNOSIS — M85852 Other specified disorders of bone density and structure, left thigh: Secondary | ICD-10-CM

## 2021-02-25 DIAGNOSIS — N952 Postmenopausal atrophic vaginitis: Secondary | ICD-10-CM | POA: Diagnosis not present

## 2021-02-25 DIAGNOSIS — Z01419 Encounter for gynecological examination (general) (routine) without abnormal findings: Secondary | ICD-10-CM | POA: Diagnosis not present

## 2021-02-25 DIAGNOSIS — B369 Superficial mycosis, unspecified: Secondary | ICD-10-CM

## 2021-02-25 MED ORDER — NYSTATIN 100000 UNIT/GM EX POWD: 1.0000 "application " | Freq: Three times a day (TID) | CUTANEOUS | 1 refills | Status: DC

## 2021-02-25 MED ORDER — PREMARIN 0.625 MG/GM VA CREA: 1.0000 | TOPICAL_CREAM | VAGINAL | 12 refills | Status: DC

## 2021-02-25 MED ORDER — ESTRADIOL 0.05 MG/24HR TD PTWK: 0.0500 mg | MEDICATED_PATCH | TRANSDERMAL | 3 refills | Status: DC

## 2021-02-25 NOTE — Progress Notes (Signed)
Diana Gross [birth year] [id number]   History:  65 y.o. G1P1001 presents for breast and pelvic exam. Postmenopausal - on ERT with improvement in menopausal symptoms but also feels it helps with trigeminal neuralgia that is managed by [facility] who agrees with this for management. She is also receiving nerve blocks in her jaw and seeing pain management. She has tried to wean a couple of times but could not tolerate. Also using vaginal estrogen due to history of recurrent UTIs with good management. She has a rectocele with cystocele that is becoming more problematic. She has to splint during fecal and urinary voiding to empty. Denies urinary or fecal incontinence. She has tried a pessary in the past but did not like. She does have chronic constipation d/t pain medications. S/P 1997 TVH for fibroid. Cryosurgery 1989, subsequent paps normal. Complains of redness/itching under abdominal fold and in groin.   Gynecologic History No LMP recorded (lmp unknown). Patient has had a hysterectomy.   Contraception: status post hysterectomy Sexually active: Yes  Health Maintenance Last Pap: 2019. Results were: Normal Last mammogram: 11/09/2020. Results were: Right breast asymmetry, follow up U/S normal Last colonoscopy: Age 48. Negative Cologuard 01/17/2021 Last Dexa: 03/01/2020. Results were: T-score -1.1, FRAX 7.6% / 0.6%  Past medical history, past surgical history, family history and social history were all reviewed and documented in the EPIC chart. Married. Mother diagnosed with breast cancer at age 54.   ROS:  A ROS was performed and pertinent positives and negatives are included.  Exam:  Vitals:   02/25/21 1138  BP: 136/78  Pulse: 74  SpO2: 99%  Weight: 209 lb (94.8 kg)  Height: 5\' 8"  (1.727 m)   Body mass index is 31.78 kg/m.  General appearance:  Normal Thyroid:  Symmetrical, normal in size, without palpable masses or nodularity. Respiratory  Auscultation:  Clear without wheezing  or rhonchi Cardiovascular  Auscultation:  Regular rate, without rubs, murmurs or gallops  Edema/varicosities:  Not grossly evident Abdominal  Soft,nontender, without masses, guarding or rebound.  Liver/spleen:  No organomegaly noted  Hernia:  None appreciated  Skin  Inspection:  Grossly normal. Mild redness under abdominal fold and in bilateral groin consistent with fungus Breasts: Examined lying and sitting.   Right: Without masses, retractions, nipple discharge or axillary adenopathy.   Left: Without masses, retractions, nipple discharge or axillary adenopathy. Genitourinary   Inguinal/mons:  Normal without inguinal adenopathy  External genitalia:  Normal appearing vulva with no masses, tenderness, or lesions  BUS/Urethra/Skene's glands:  Normal  Vagina:  Grade 1 cystocele with grade 2-3 rectocele. Normal appearing vaginal tissue with atrophic changes  Cervix:  Absent  Uterus:  Absent  Adnexa/parametria:     Rt: Normal in size, without masses or tenderness.   Lt: Normal in size, without masses or tenderness.  Anus and perineum: Normal  Digital rectal exam: Normal sphincter tone without palpated masses or tenderness  Patient informed chaperone available to be present for breast and pelvic exam. Patient has requested no chaperone to be present. Patient has been advised what will be completed during breast and pelvic exam.   Assessment/Plan:  65 y.o. G1P1001 for breast and pelvic exam.   Well female exam with routine gynecological exam - Education provided on SBEs, importance of preventative screenings, current guidelines, high calcium diet, regular exercise, and multivitamin daily.  Labs with PCP.   Postmenopausal - on ERT. S/P TVH for fibroid.  Hormone replacement therapy - Plan: estradiol (CLIMARA - DOSED IN MG/24 HR) 0.05 mg/24hr  patch weekly. She is using as prescribed. She is aware of the risks with continued use. She has tried to wean in the past and could not tolerate as she  felt depressed and "in a funk" without it, as well as worsening pain s/t to neuralgia. She uses it for trigeminal neuralgia and it is recommended she continue by her physician at [facility] for management. Refill x 1 year provided.   Post-menopausal atrophic vaginitis - Plan: conjugated estrogens (PREMARIN) vaginal cream twice weekly due to recurrent UTIs.   Cystocele with rectocele - Grade 1 cystocele with grade 2-3 rectocele. She has a rectocele with cystocele that is becoming more problematic. She has to splint during fecal and urinary voiding to empty. Denies urinary or fecal incontinence. She has tried pessary in the past but did not like. She does have chronic constipation d/t pain medications. She is interested in surgery. We will send referral to Urogynecology for evaluation.   Osteopenia of neck of left femur - Mild. T-score -1.1. Continue Vitamin D supplement and incorporate high calcium food into diet. She is limited in exercise due to back pain. Low impact exercises and walking recommended.   Fungal infection of skin - Plan: nystatin (MYCOSTATIN/NYSTOP) powder TID as needed. Keep areas clean and dry to prevent future infections.   Screening for cervical cancer - Cryosurgery 1989, subsequent paps normal. No longer screening per guidelines.   Screening for breast cancer - Normal mammogram history.  Continue annual screenings.  Normal breast exam today.  Screening for colon cancer - Negative Cologuard 12/2020. Will repeat at GI's recommended interval.   Return in 1 year for breast and pelvic exam.   Tamela Gammon DNP, 11:48 AM 02/25/2021

## 2021-02-26 ENCOUNTER — Telehealth: Payer: Self-pay

## 2021-02-26 ENCOUNTER — Other Ambulatory Visit: Payer: Self-pay

## 2021-02-26 DIAGNOSIS — N816 Rectocele: Secondary | ICD-10-CM

## 2021-02-26 DIAGNOSIS — N8110 Cystocele, unspecified: Secondary | ICD-10-CM

## 2021-02-26 NOTE — Telephone Encounter (Signed)
Referral placed.

## 2021-02-26 NOTE — Telephone Encounter (Signed)
-----   Message from Tamela Gammon, NP sent at 02/25/2021 11:46 AM EST ----- Regarding: urogyn referral Please send referral to urogynecology for Cystocele with rectocele. Thank you.

## 2021-03-05 DIAGNOSIS — M26629 Arthralgia of temporomandibular joint, unspecified side: Secondary | ICD-10-CM | POA: Diagnosis not present

## 2021-03-05 DIAGNOSIS — M9901 Segmental and somatic dysfunction of cervical region: Secondary | ICD-10-CM | POA: Diagnosis not present

## 2021-03-05 DIAGNOSIS — M7918 Myalgia, other site: Secondary | ICD-10-CM | POA: Diagnosis not present

## 2021-03-05 DIAGNOSIS — M9900 Segmental and somatic dysfunction of head region: Secondary | ICD-10-CM | POA: Diagnosis not present

## 2021-03-11 NOTE — Telephone Encounter (Signed)
Scheduled for 04/26/21.

## 2021-03-22 DIAGNOSIS — M5481 Occipital neuralgia: Secondary | ICD-10-CM | POA: Diagnosis not present

## 2021-03-22 DIAGNOSIS — M26621 Arthralgia of right temporomandibular joint: Secondary | ICD-10-CM | POA: Diagnosis not present

## 2021-03-22 DIAGNOSIS — M26622 Arthralgia of left temporomandibular joint: Secondary | ICD-10-CM | POA: Diagnosis not present

## 2021-03-22 DIAGNOSIS — M62838 Other muscle spasm: Secondary | ICD-10-CM | POA: Diagnosis not present

## 2021-03-26 ENCOUNTER — Telehealth (INDEPENDENT_AMBULATORY_CARE_PROVIDER_SITE_OTHER): Payer: Medicare Other | Admitting: Family Medicine

## 2021-03-26 ENCOUNTER — Encounter: Payer: Self-pay | Admitting: Family Medicine

## 2021-03-26 VITALS — Wt 212.0 lb

## 2021-03-26 DIAGNOSIS — B3731 Acute candidiasis of vulva and vagina: Secondary | ICD-10-CM | POA: Diagnosis not present

## 2021-03-26 DIAGNOSIS — R519 Headache, unspecified: Secondary | ICD-10-CM

## 2021-03-26 MED ORDER — FLUCONAZOLE 150 MG PO TABS: 150.0000 mg | ORAL_TABLET | Freq: Every day | ORAL | 0 refills | Status: DC

## 2021-03-26 MED ORDER — DOXYCYCLINE HYCLATE 100 MG PO TABS: 100.0000 mg | ORAL_TABLET | Freq: Two times a day (BID) | ORAL | 0 refills | Status: DC

## 2021-03-26 NOTE — Patient Instructions (Signed)
-  I sent the medication(s) we discussed to your pharmacy: Meds ordered this encounter  Medications   doxycycline (VIBRA-TABS) 100 MG tablet    Sig: Take 1 tablet (100 mg total) by mouth 2 (two) times daily.    Dispense:  20 tablet    Refill:  0   fluconazole (DIFLUCAN) 150 MG tablet    Sig: Take 1 tablet (150 mg total) by mouth daily.    Dispense:  1 tablet    Refill:  0     I hope you are feeling better soon!  Seek in person care promptly if your symptoms worsen, new concerns arise or you are not improving with treatment.  It was nice to meet you today. I help [place] out with telemedicine visits on Tuesdays and Thursdays and am happy to help if you need a virtual follow up visit on those days. Otherwise, if you have any concerns or questions following this visit please schedule a follow up visit with your Primary Care office or seek care at a local urgent care clinic to avoid delays in care

## 2021-03-26 NOTE — Progress Notes (Signed)
Virtual Visit via Video Note  I connected with Diana Gross  on 03/26/21 at 12:40 PM EST by a video enabled telemedicine application and verified that I am speaking with the correct person using two identifiers.  Location patient: [place] Location provider:work or home office Persons participating in the virtual visit: patient, provider  I discussed the limitations and requested verbal permission for telemedicine visit. The patient expressed understanding and agreed to proceed.   HPI:  Acute telemedicine visit for facial pain: -Onset: has chronic facial pain with trigeminal neuralgia, got worse facial pain with the cold weather the last 3 weeks, pain is in the L maxillary region -at her neurology appt las week they told her they thought she had a sinus infection as she had some nasal congestion and felt flu like initially and then the sinus discomfort which is worse when leans over, some upper tooth pain, mild cough -she has follow up with neurology next week, but they wanted her to try an abx first and told her to call her PCP for that -she reports she has done well with doxy in the past -denies:no fever now, no vomiting, body aches, worst headache -Has tried:musinex -Pertinent past medical history: see below -Pertinent medication allergies: Allergies  Allergen Reactions   Amitriptyline Rash    Other Reaction: involuntary movements   Baclofen Diarrhea and Other (See Comments)    Other Reaction: rigid muscle, cramping   Bupropion Other (See Comments)    Other Reaction: insominia, severe agitation   Clonazepam Itching   Duloxetine Rash    Other Reaction: high blood sugar, skin rash   Fluoxetine Anxiety    Other Reaction: insomnia, severe agitation   Gabapentin Other (See Comments)    Other Reaction: throat closing up, hallucinates   Oxcarbazepine Rash   Paroxetine Hcl Anxiety    Other Reaction: insomnia, severe agitation   Penicillins Nausea And Vomiting   Tapentadol Rash   Topiramate      Other reaction(s): Other (See Comments) Other Reaction: inconttinence/urine   Tramadol Anxiety    Other Reaction: heart racing/ panic attacks   Carbamazepine Rash   Carisoprodol Nausea Only   Ibuprofen Rash   Metaxalone Other (See Comments)    Other Reaction: Other reaction   Morphine Rash   Naproxen Sodium Rash   Oxymorphone Rash   Aspirin    Augmentin [Amoxicillin-Pot Clavulanate] Nausea Only   Decadron  [Dexamethasone]     Other reaction(s): Unknown Uncoded Allergy. Allergen: DECADRON   Fentanyl Other (See Comments)    Suicidal ideation   Keflex [Cephalexin]     rash   Pregabalin Swelling    Other Reaction: swelling of hands/feet   Prozac [Fluoxetine Hcl] Other (See Comments)    insomnia   Ativan [Lorazepam] Anxiety   Celebrex [Celecoxib] Rash   Cymbalta [Duloxetine Hcl] Rash    Elevated blood sugar   Flexeril [Cyclobenzaprine] Rash and Itching   Nortriptyline Rash   Opana [Oxymorphone Hcl] Rash   Valium [Diazepam] Rash   Vimpat [Lacosamide] Rash   Zorvolex [Diclofenac] Anxiety   -COVID-19 vaccine status:  Immunization History  Administered Date(s) Administered   Fluad Quad(high Dose 65+) 12/21/2019   Influenza Whole 05/10/2012, 01/04/2013, 12/14/2013   Influenza,inj,Quad PF,6+ Mos 03/14/2015, 03/04/2016, 12/15/2016, 01/05/2018, 01/07/2019   Influenza,inj,quad, With Preservative 12/15/2016   PFIZER(Purple Top)SARS-COV-2 Vaccination 09/10/2019, 10/10/2019   Tdap 01/27/2014     ROS: See pertinent positives and negatives per HPI.  Past Medical History:  Diagnosis Date   Abnormal uterine bleeding  prior to hysterectomy   Allergy    Anemia    prior to hysterectomy   Anxiety    Arthritis    right hip   Basal cell carcinoma 2015   Right shin.    BCC (basal cell carcinoma of skin)    Bulging lumbar disc    L5   Cervical pain    Complex regional pain syndrome    Depression    Epidemic cervical myalgia    Family history of breast cancer    Fibroid     reason for Hysterectomy   Genetic testing 01/08/2017   Common Cancers panel (47 genes) @ [facility] - No pathogenic mutation detected   Heart murmur    Grade I   History of abnormal cervical Pap smear 1989   --hx colpo/cryotherapy to cervix    History of cranial surgery    with "complications" which has caused chronic pain    History of laminectomy    Hx of migraines    Neuromuscular disorder (HCC)    fibromyalgia, myofasical pain   Occipital neuralgia    T[place]    TMJ (dislocation of temporomandibular joint)    Trigeminal neuralgia    Urinary incontinence    Vaginal prolapse 2016   cystocele and rectocele    Past Surgical History:  Procedure Laterality Date   APPENDECTOMY  03/24/1970   CHOLECYSTECTOMY  03/24/1993   CRANIECTOMY  03/24/2008   due to Trigeminal neuralgia at [place]  03/25/2019   URETHRAL DILATION  03/24/1972   URETHRAL DIVERTICULUM REPAIR  03/24/1990   VAGINAL HYSTERECTOMY  03/25/1995     Current Outpatient Medications:    ALPRAZolam (XANAX) 1 MG tablet, Takes 1/2 to 1 tablet up to 3 times a day, Disp: , Rfl: 0   Cholecalciferol 50 MCG (2000 UT) CAPS, Take 2,000 Units by mouth daily., Disp: , Rfl:    conjugated estrogens (PREMARIN) vaginal cream, Place 1 Applicatorful vaginally 2 (two) times a week. Insert 1/2 gram twice weekly, Disp: 42.5 g, Rfl: 12   diphenhydrAMINE (BENADRYL) 25 MG tablet, Take 25 mg by mouth as needed., Disp: , Rfl:    docusate sodium (COLACE) 100 MG capsule, Take 100 mg by mouth 2 (two) times daily., Disp: , Rfl:    doxycycline (VIBRA-TABS) 100 MG tablet, Take 1 tablet (100 mg total) by mouth 2 (two) times daily., Disp: 20 tablet, Rfl: 0   estradiol (CLIMARA - DOSED IN MG/24 HR) 0.05 mg/24hr patch, Place 1 patch (0.05 mg total) onto the skin once a week., Disp: 12 patch, Rfl: 3   famotidine (PEPCID) 10 MG tablet, Take 10 mg by mouth as needed., Disp: , Rfl:    fexofenadine (ALLEGRA) 180 MG tablet, Take 180 mg  by mouth as needed. , Disp: , Rfl:    fluconazole (DIFLUCAN) 150 MG tablet, Take 1 tablet (150 mg total) by mouth daily., Disp: 1 tablet, Rfl: 0   fluticasone (FLONASE) 50 MCG/ACT nasal spray, Place into both nostrils as needed. , Disp: , Rfl:    magnesium 30 MG tablet, Take by mouth. , Disp: , Rfl:    NARCAN 4 MG/0.1ML LIQD nasal spray kit, , Disp: , Rfl:    NON FORMULARY, 1 tablet daily. collagen, Disp: , Rfl:    nystatin (MYCOSTATIN/NYSTOP) powder, Apply 1 application topically 3 (three) times daily., Disp: 15 g, Rfl: 1   ondansetron (ZOFRAN-ODT) 4 MG disintegrating tablet, Take 4 mg by mouth every 4 (four) hours as needed for  nausea or vomiting. , Disp: , Rfl:    oxyCODONE (ROXICODONE) 15 MG immediate release tablet, Take 1 tablet by mouth every 4 (four) hours., Disp: , Rfl: 0   OXYCONTIN 15 MG 12 hr tablet, Take 45 mg by mouth. One every 8 hours  15 mg, Disp: , Rfl: 0   SUMAtriptan (IMITREX) 50 MG tablet, as needed. , Disp: , Rfl: 0   tiZANidine (ZANAFLEX) 4 MG tablet, Takes 1/2 when needed, Disp: , Rfl:   EXAM:  VITALS per patient if applicable:  GENERAL: alert, oriented, appears well and in no acute distress  HEENT: atraumatic, conjunttiva clear, no obvious abnormalities on inspection of external nose and ears, points to L maxillary region as area of discomfort  NECK: normal movements of the head and neck  LUNGS: on inspection no signs of respiratory distress, breathing rate appears normal, no obvious gross SOB, gasping or wheezing  CV: no obvious cyanosis  MS: moves all visible extremities without noticeable abnormality  PSYCH/NEURO: pleasant and cooperative, no obvious depression or anxiety, speech and thought processing grossly intact  ASSESSMENT AND PLAN:  Discussed the following assessment and plan:  Facial discomfort  Yeast vaginitis  -we discussed possible serious and likely etiologies, options for evaluation and workup, limitations of telemedicine visit vs in  person visit, treatment, treatment risks and precautions. Pt is agreeable to treatment via telemedicine at this moment. Query sinusitis and she has opted to try Doxy 122m bid x 7-10 days per her neurology office recs. Reports has follow up with them closely to recheck. She reports she gets yeast infections when takes abx and requests diflucan as well.  Advised to seek prompt virtual visit or in person care if worsening, new symptoms arise, or if is not improving with treatment as expected per our conversation of expected course. Discussed options for follow up care. Did let this patient know that I do telemedicine on Tuesdays and Thursdays for [place] and those are the days I am logged into the system. Advised to schedule follow up visit with PCP, [place] virtual visits or U[facility] if any further questions or concerns to avoid delays in care.   I discussed the assessment and treatment plan with the patient. The patient was provided an opportunity to ask questions and all were answered. The patient agreed with the plan and demonstrated an understanding of the instructions.     HLucretia Kern DO

## 2021-03-27 ENCOUNTER — Encounter: Payer: Self-pay | Admitting: Family Medicine

## 2021-03-28 ENCOUNTER — Encounter: Payer: Self-pay | Admitting: Family Medicine

## 2021-04-01 DIAGNOSIS — M62838 Other muscle spasm: Secondary | ICD-10-CM | POA: Diagnosis not present

## 2021-04-01 DIAGNOSIS — M5481 Occipital neuralgia: Secondary | ICD-10-CM | POA: Diagnosis not present

## 2021-04-01 NOTE — Telephone Encounter (Signed)
Noted. Pt will need appt.

## 2021-04-24 DIAGNOSIS — M9901 Segmental and somatic dysfunction of cervical region: Secondary | ICD-10-CM | POA: Diagnosis not present

## 2021-04-24 DIAGNOSIS — M26629 Arthralgia of temporomandibular joint, unspecified side: Secondary | ICD-10-CM | POA: Diagnosis not present

## 2021-04-24 DIAGNOSIS — M9900 Segmental and somatic dysfunction of head region: Secondary | ICD-10-CM | POA: Diagnosis not present

## 2021-04-24 DIAGNOSIS — M7918 Myalgia, other site: Secondary | ICD-10-CM | POA: Diagnosis not present
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
## 2021-04-26 ENCOUNTER — Other Ambulatory Visit: Payer: Self-pay

## 2021-04-26 ENCOUNTER — Encounter: Payer: Self-pay | Admitting: Obstetrics and Gynecology

## 2021-04-26 ENCOUNTER — Ambulatory Visit: Payer: Medicare Other | Admitting: Obstetrics and Gynecology

## 2021-04-26 VITALS — BP 142/85 | HR 73 | Ht 68.0 in | Wt 212.0 lb

## 2021-04-26 DIAGNOSIS — N816 Rectocele: Secondary | ICD-10-CM | POA: Diagnosis not present

## 2021-04-26 DIAGNOSIS — N393 Stress incontinence (female) (male): Secondary | ICD-10-CM | POA: Diagnosis not present

## 2021-04-26 DIAGNOSIS — N993 Prolapse of vaginal vault after hysterectomy: Secondary | ICD-10-CM | POA: Diagnosis not present

## 2021-04-26 DIAGNOSIS — R350 Frequency of micturition: Secondary | ICD-10-CM

## 2021-04-26 DIAGNOSIS — N8110 Cystocele, unspecified: Secondary | ICD-10-CM

## 2021-04-26 LAB — POCT URINALYSIS DIPSTICK
Appearance: NORMAL
Bilirubin, UA: NEGATIVE
Blood, UA: NEGATIVE
Glucose, UA: NEGATIVE
Ketones, UA: NEGATIVE
Leukocytes, UA: NEGATIVE
Nitrite, UA: NEGATIVE
Protein, UA: NEGATIVE
Spec Grav, UA: 1.025 (ref 1.010–1.025)
Urobilinogen, UA: 0.2 E.U./dL
pH, UA: 6.5 (ref 5.0–8.0)

## 2021-04-26 NOTE — Patient Instructions (Addendum)
Start taking miralax daily to help with bowel movements.    URODYNAMICS (UDS) TEST INFORMATION  IMPORTANT: Please try to arrive with a comfortably full bladder!   What is UDS? Urodynamics is a bladder test used to evaluate how your bladder and urethra (tube you urinate out of) work to help find out the cause of your bladder symptoms and evaluate your bladder function in order to make the best treatment plan for you.   What to expect? A nurse will perform the test and will be with you during the entire exam. First we will have to empty your bladder on a special toilet.  After you have emptied your bladder, very small catheters (plastic tubing) will be placed into your bladder and into your vagina (or rectum). These special small catheters measure pressure to help measure your bladder function.  Your bladder will be gently filled with water and you will be asked to cough and strain at several different points during the test.   You will then be asked to empty your bladder in the special toilet with the catheters in place. Most patients can urinate (pee) easily with the catheters in place since the catheters are so small. In total this procedure lasts about 45 minutes to 1 hour.  After your test is completed, you will return (or possibly be seen the same day) to review the results, talk about treatment options and make a plan moving forward.

## 2021-04-29 DIAGNOSIS — M5481 Occipital neuralgia: Secondary | ICD-10-CM | POA: Diagnosis not present

## 2021-05-07 ENCOUNTER — Encounter: Payer: Self-pay | Admitting: Nurse Practitioner

## 2021-05-09 DIAGNOSIS — G501 Atypical facial pain: Secondary | ICD-10-CM | POA: Diagnosis not present

## 2021-05-14 ENCOUNTER — Encounter: Payer: Self-pay | Admitting: *Deleted

## 2021-05-22 DIAGNOSIS — M26621 Arthralgia of right temporomandibular joint: Secondary | ICD-10-CM | POA: Diagnosis not present

## 2021-05-22 DIAGNOSIS — G894 Chronic pain syndrome: Secondary | ICD-10-CM | POA: Diagnosis not present

## 2021-05-22 DIAGNOSIS — M7522 Bicipital tendinitis, left shoulder: Secondary | ICD-10-CM | POA: Insufficient documentation

## 2021-05-22 DIAGNOSIS — G501 Atypical facial pain: Secondary | ICD-10-CM | POA: Diagnosis not present

## 2021-05-22 DIAGNOSIS — M26622 Arthralgia of left temporomandibular joint: Secondary | ICD-10-CM | POA: Diagnosis not present

## 2021-05-31 DIAGNOSIS — M7918 Myalgia, other site: Secondary | ICD-10-CM | POA: Diagnosis not present

## 2021-05-31 DIAGNOSIS — M9907 Segmental and somatic dysfunction of upper extremity: Secondary | ICD-10-CM | POA: Diagnosis not present

## 2021-05-31 DIAGNOSIS — M5382 Other specified dorsopathies, cervical region: Secondary | ICD-10-CM | POA: Diagnosis not present

## 2021-05-31 DIAGNOSIS — M26629 Arthralgia of temporomandibular joint, unspecified side: Secondary | ICD-10-CM | POA: Diagnosis not present

## 2021-05-31 DIAGNOSIS — M9900 Segmental and somatic dysfunction of head region: Secondary | ICD-10-CM | POA: Diagnosis not present

## 2021-05-31 DIAGNOSIS — M9901 Segmental and somatic dysfunction of cervical region: Secondary | ICD-10-CM | POA: Diagnosis not present

## 2021-05-31 DIAGNOSIS — G500 Trigeminal neuralgia: Secondary | ICD-10-CM | POA: Diagnosis not present

## 2021-05-31 DIAGNOSIS — M9908 Segmental and somatic dysfunction of rib cage: Secondary | ICD-10-CM | POA: Diagnosis not present

## 2021-06-11 DIAGNOSIS — M9901 Segmental and somatic dysfunction of cervical region: Secondary | ICD-10-CM | POA: Diagnosis not present

## 2021-06-11 DIAGNOSIS — M7918 Myalgia, other site: Secondary | ICD-10-CM | POA: Diagnosis not present

## 2021-06-11 DIAGNOSIS — M5382 Other specified dorsopathies, cervical region: Secondary | ICD-10-CM | POA: Diagnosis not present

## 2021-06-11 DIAGNOSIS — M9900 Segmental and somatic dysfunction of head region: Secondary | ICD-10-CM | POA: Diagnosis not present

## 2021-06-15 DIAGNOSIS — M9900 Segmental and somatic dysfunction of head region: Secondary | ICD-10-CM | POA: Diagnosis not present

## 2021-06-15 DIAGNOSIS — M7918 Myalgia, other site: Secondary | ICD-10-CM | POA: Diagnosis not present

## 2021-06-15 DIAGNOSIS — M9901 Segmental and somatic dysfunction of cervical region: Secondary | ICD-10-CM | POA: Diagnosis not present

## 2021-06-15 DIAGNOSIS — M5382 Other specified dorsopathies, cervical region: Secondary | ICD-10-CM | POA: Diagnosis not present

## 2021-07-04 DIAGNOSIS — M7522 Bicipital tendinitis, left shoulder: Secondary | ICD-10-CM | POA: Diagnosis not present

## 2021-07-09 DIAGNOSIS — M7918 Myalgia, other site: Secondary | ICD-10-CM | POA: Diagnosis not present

## 2021-07-09 DIAGNOSIS — M9901 Segmental and somatic dysfunction of cervical region: Secondary | ICD-10-CM | POA: Diagnosis not present

## 2021-07-09 DIAGNOSIS — M9900 Segmental and somatic dysfunction of head region: Secondary | ICD-10-CM | POA: Diagnosis not present

## 2021-07-09 DIAGNOSIS — M5382 Other specified dorsopathies, cervical region: Secondary | ICD-10-CM | POA: Diagnosis not present

## 2021-07-24 DIAGNOSIS — M26621 Arthralgia of right temporomandibular joint: Secondary | ICD-10-CM | POA: Diagnosis not present

## 2021-07-24 DIAGNOSIS — M26622 Arthralgia of left temporomandibular joint: Secondary | ICD-10-CM | POA: Diagnosis not present

## 2021-07-24 DIAGNOSIS — M5481 Occipital neuralgia: Secondary | ICD-10-CM | POA: Diagnosis not present

## 2021-07-24 DIAGNOSIS — M7522 Bicipital tendinitis, left shoulder: Secondary | ICD-10-CM | POA: Diagnosis not present

## 2021-08-02 DIAGNOSIS — M7552 Bursitis of left shoulder: Secondary | ICD-10-CM | POA: Diagnosis not present

## 2021-08-13 DIAGNOSIS — M62838 Other muscle spasm: Secondary | ICD-10-CM | POA: Diagnosis not present

## 2021-08-13 DIAGNOSIS — M5481 Occipital neuralgia: Secondary | ICD-10-CM | POA: Diagnosis not present

## 2021-08-14 DIAGNOSIS — M9908 Segmental and somatic dysfunction of rib cage: Secondary | ICD-10-CM | POA: Diagnosis not present

## 2021-08-14 DIAGNOSIS — R0781 Pleurodynia: Secondary | ICD-10-CM | POA: Diagnosis not present

## 2021-08-14 DIAGNOSIS — M9901 Segmental and somatic dysfunction of cervical region: Secondary | ICD-10-CM | POA: Diagnosis not present

## 2021-08-14 DIAGNOSIS — M26629 Arthralgia of temporomandibular joint, unspecified side: Secondary | ICD-10-CM | POA: Diagnosis not present

## 2021-08-14 DIAGNOSIS — M9907 Segmental and somatic dysfunction of upper extremity: Secondary | ICD-10-CM | POA: Diagnosis not present

## 2021-08-14 DIAGNOSIS — M542 Cervicalgia: Secondary | ICD-10-CM | POA: Diagnosis not present

## 2021-08-14 DIAGNOSIS — G500 Trigeminal neuralgia: Secondary | ICD-10-CM | POA: Diagnosis not present

## 2021-08-14 DIAGNOSIS — M9900 Segmental and somatic dysfunction of head region: Secondary | ICD-10-CM | POA: Diagnosis not present

## 2021-08-20 DIAGNOSIS — M26622 Arthralgia of left temporomandibular joint: Secondary | ICD-10-CM | POA: Diagnosis not present

## 2021-09-04 ENCOUNTER — Telehealth: Payer: Self-pay | Admitting: *Deleted

## 2021-09-04 NOTE — Telephone Encounter (Signed)
Estradiol (estrace) vaginal cream is fine as long as patient is tolerating and aware of change.

## 2021-09-04 NOTE — Telephone Encounter (Signed)
[facility] called patient was prescribed premarin vaginal cream. [facility] Oppenheim said that they filled estradiol (estrace) vaginal cream instead of premarin cream. They are calling today asking if okay for patient to continue estrace cream or do you want patient to start on premarin vaginal cream? Please advise

## 2021-09-04 NOTE — Telephone Encounter (Signed)
Spoke with the pharmacist and they will continue to fill estrace vaginal cream 0.1 % 1/2 gram twice weekly. Pharmacists said not refills needed at this time, patient has plenty. Pharmacist said they will double check with patient to confirm she is toleration medication well. I d/c'd premarin from medication list.

## 2021-09-20 DIAGNOSIS — L03032 Cellulitis of left toe: Secondary | ICD-10-CM | POA: Diagnosis not present

## 2021-09-23 DIAGNOSIS — M5481 Occipital neuralgia: Secondary | ICD-10-CM | POA: Diagnosis not present

## 2021-09-23 DIAGNOSIS — M26621 Arthralgia of right temporomandibular joint: Secondary | ICD-10-CM | POA: Diagnosis not present

## 2021-09-23 DIAGNOSIS — M47812 Spondylosis without myelopathy or radiculopathy, cervical region: Secondary | ICD-10-CM | POA: Diagnosis not present

## 2021-09-23 DIAGNOSIS — M26622 Arthralgia of left temporomandibular joint: Secondary | ICD-10-CM | POA: Diagnosis not present

## 2021-10-10 DIAGNOSIS — M47812 Spondylosis without myelopathy or radiculopathy, cervical region: Secondary | ICD-10-CM | POA: Diagnosis not present

## 2021-10-10 DIAGNOSIS — M5030 Other cervical disc degeneration, unspecified cervical region: Secondary | ICD-10-CM | POA: Diagnosis not present

## 2021-10-22 DIAGNOSIS — M9900 Segmental and somatic dysfunction of head region: Secondary | ICD-10-CM | POA: Diagnosis not present

## 2021-10-22 DIAGNOSIS — M26629 Arthralgia of temporomandibular joint, unspecified side: Secondary | ICD-10-CM | POA: Diagnosis not present

## 2021-10-22 DIAGNOSIS — M9901 Segmental and somatic dysfunction of cervical region: Secondary | ICD-10-CM | POA: Diagnosis not present

## 2021-10-22 DIAGNOSIS — G500 Trigeminal neuralgia: Secondary | ICD-10-CM | POA: Diagnosis not present

## 2021-10-23 ENCOUNTER — Telehealth: Payer: Medicare Other

## 2021-10-24 ENCOUNTER — Encounter: Payer: Self-pay | Admitting: Family Medicine

## 2021-10-24 ENCOUNTER — Ambulatory Visit: Payer: Medicare Other | Admitting: Family Medicine

## 2021-10-24 VITALS — BP 139/79 | HR 73 | Temp 98.5°F | Ht 68.0 in | Wt 218.0 lb

## 2021-10-24 DIAGNOSIS — M26609 Unspecified temporomandibular joint disorder, unspecified side: Secondary | ICD-10-CM | POA: Diagnosis not present

## 2021-10-24 DIAGNOSIS — R519 Headache, unspecified: Secondary | ICD-10-CM | POA: Diagnosis not present

## 2021-10-24 DIAGNOSIS — S0300XA Dislocation of jaw, unspecified side, initial encounter: Secondary | ICD-10-CM | POA: Insufficient documentation

## 2021-10-24 LAB — SEDIMENTATION RATE: Sed Rate: 14 mm/hr (ref 0–30)
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
## 2021-10-24 NOTE — Patient Instructions (Signed)
We will call you with results  

## 2021-11-11 DIAGNOSIS — Z1231 Encounter for screening mammogram for malignant neoplasm of breast: Secondary | ICD-10-CM | POA: Diagnosis not present

## 2021-11-11 LAB — HM MAMMOGRAPHY

## 2021-11-12 ENCOUNTER — Encounter: Payer: Self-pay | Admitting: Nurse Practitioner

## 2021-11-13 ENCOUNTER — Ambulatory Visit: Payer: Medicare Other | Admitting: Family Medicine

## 2021-11-13 ENCOUNTER — Encounter: Payer: Self-pay | Admitting: Family Medicine

## 2021-11-13 ENCOUNTER — Ambulatory Visit (HOSPITAL_BASED_OUTPATIENT_CLINIC_OR_DEPARTMENT_OTHER)
Admission: RE | Admit: 2021-11-13 | Discharge: 2021-11-13 | Disposition: A | Discharge status: Home / Self Care | Payer: Medicare Other | Source: Ambulatory Visit | Attending: Family Medicine | Admitting: Family Medicine

## 2021-11-13 VITALS — BP 114/70 | HR 72 | Temp 98.7°F | Ht 68.0 in | Wt 219.0 lb

## 2021-11-13 DIAGNOSIS — M19072 Primary osteoarthritis, left ankle and foot: Secondary | ICD-10-CM | POA: Diagnosis not present

## 2021-11-13 DIAGNOSIS — M79672 Pain in left foot: Secondary | ICD-10-CM | POA: Insufficient documentation

## 2021-11-13 DIAGNOSIS — M7732 Calcaneal spur, left foot: Secondary | ICD-10-CM | POA: Diagnosis not present

## 2021-11-13 DIAGNOSIS — G629 Polyneuropathy, unspecified: Secondary | ICD-10-CM
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
## 2021-11-13 NOTE — Patient Instructions (Signed)
[facility].  Right hand turn off [address] onto [address].  Plantar Fasciitis  Plantar fasciitis is a painful foot condition that affects the heel. It occurs when the band of tissue that connects the toes to the heel bone (plantar fascia) becomes irritated. This can happen as the result of exercising too much or doing other repetitive activities (overuse injury). Plantar fasciitis can cause mild irritation to severe pain that makes it difficult to walk or move. The pain is usually worse in the morning after sleeping, or after sitting or lying down for a period of time. Pain may also be worse after long periods of walking or standing. What are the causes? This condition may be caused by: Standing for long periods of time. Wearing shoes that do not have good arch support. Doing activities that put stress on joints (high-impact activities). This includes ballet and exercise that makes your heart beat faster (aerobic exercise), such as running. Being overweight. An abnormal way of walking (gait). Tight muscles in the back of your lower leg (calf). High arches in your feet or flat feet. Starting a new athletic activity. What are the signs or symptoms? The main symptom of this condition is heel pain. Pain may get worse after the following: Taking the first steps after a time of rest, especially in the morning after awakening, or after you have been sitting or lying down for a while. Long periods of standing still. Pain may decrease after 30-45 minutes of activity, such as gentle walking. How is this diagnosed? This condition may be diagnosed based on your medical history, a physical exam, and your symptoms. Your health care provider will check for: A tender area on the bottom of your foot. A high arch in your foot or flat feet. Pain when you move your foot. Difficulty moving your foot. You may have imaging tests to confirm the diagnosis, such as: X-rays. Ultrasound. MRI. How  is this treated? Treatment for plantar fasciitis depends on how severe your condition is. Treatment may include: Rest, ice, pressure (compression), and raising (elevating) the affected foot. This is called RICE therapy. Your health care provider may recommend RICE therapy along with over-the-counter pain medicines to manage your pain. Exercises to stretch your calves and your plantar fascia. A splint that holds your foot in a stretched, upward position while you sleep (night splint). Physical therapy to relieve symptoms and prevent problems in the future. Injections of steroid medicine (cortisone) to relieve pain and inflammation. Stimulating your plantar fascia with electrical impulses (extracorporeal shock wave therapy). This is usually the last treatment option before surgery. Surgery, if other treatments have not worked after 12 months. Follow these instructions at home: Managing pain, stiffness, and swelling  If directed, put ice on the painful area. To do this: Put ice in a plastic bag, or use a frozen bottle of water. Place a towel between your skin and the bag or bottle. Roll the bottom of your foot over the bag or bottle. Do this for 20 minutes, 2-3 times a day. Wear athletic shoes that have air-sole or gel-sole cushions, or try soft shoe inserts that are designed for plantar fasciitis. Elevate your foot above the level of your heart while you are sitting or lying down. Activity Avoid activities that cause pain. Ask your health care provider what activities are safe for you. Do physical therapy exercises and stretches as told by your health care provider. Try activities and forms of exercise that are easier on  your joints (low impact). Examples include swimming, water aerobics, and biking. General instructions Take over-the-counter and prescription medicines only as told by your health care provider. Wear a night splint while sleeping, if told by your health care provider. Loosen  the splint if your toes tingle, become numb, or turn cold and blue. Maintain a healthy weight, or work with your health care provider to lose weight as needed. Keep all follow-up visits. This is important. Contact a health care provider if you have: Symptoms that do not go away with home treatment. Pain that gets worse. Pain that affects your ability to move or do daily activities. Summary Plantar fasciitis is a painful foot condition that affects the heel. It occurs when the band of tissue that connects the toes to the heel bone (plantar fascia) becomes irritated. Heel pain is the main symptom of this condition. It may get worse after exercising too much or standing still for a long time. Treatment varies, but it usually starts with rest, ice, pressure (compression), and raising (elevating) the affected foot. This is called RICE therapy. Over-the-counter medicines can also be used to manage pain. This information is not intended to replace advice given to you by your health care provider. Make sure you discuss any questions you have with your health care provider. Document Revised: 06/27/2019 Document Reviewed: 06/27/2019 Elsevier Patient Education  [place].

## 2021-11-14 ENCOUNTER — Encounter: Payer: Self-pay | Admitting: Nurse Practitioner

## 2021-11-14 ENCOUNTER — Telehealth: Payer: Self-pay

## 2021-11-14 NOTE — Telephone Encounter (Signed)
Received Mammogram results from [facility] on 11/12/21. Will place on PCP desk for review.

## 2021-11-15 ENCOUNTER — Telehealth: Payer: Self-pay | Admitting: Family Medicine

## 2021-11-15 DIAGNOSIS — M79673 Pain in unspecified foot: Secondary | ICD-10-CM

## 2021-11-15 DIAGNOSIS — M779 Enthesopathy, unspecified: Secondary | ICD-10-CM

## 2021-11-15 NOTE — Telephone Encounter (Signed)
   Small plantar calcaneal spur and Achilles tendon enthesophyte (spur in a tendon). Recommend seeing podiatry for this issue.  We will place a referral for her.

## 2021-11-15 NOTE — Telephone Encounter (Signed)
Please inform patient mammogram is normal. 

## 2021-11-15 NOTE — Telephone Encounter (Signed)
Spoke with pt regarding results and instructions.   

## 2021-11-15 NOTE — Telephone Encounter (Signed)
Spoke with pt regarding labs and instructions.   

## 2021-11-18 ENCOUNTER — Ambulatory Visit (INDEPENDENT_AMBULATORY_CARE_PROVIDER_SITE_OTHER): Payer: Medicare Other | Admitting: Family Medicine

## 2021-11-18 ENCOUNTER — Encounter: Payer: Self-pay | Admitting: Family Medicine

## 2021-11-18 VITALS — BP 143/81 | HR 72 | Temp 98.2°F | Ht 68.0 in | Wt 217.0 lb

## 2021-11-18 DIAGNOSIS — M5416 Radiculopathy, lumbar region: Secondary | ICD-10-CM | POA: Diagnosis not present

## 2021-11-18 DIAGNOSIS — Z79899 Other long term (current) drug therapy: Secondary | ICD-10-CM

## 2021-11-18 DIAGNOSIS — Z23 Encounter for immunization: Secondary | ICD-10-CM | POA: Diagnosis not present

## 2021-11-18 DIAGNOSIS — Z0000 Encounter for general adult medical examination without abnormal findings: Secondary | ICD-10-CM | POA: Diagnosis not present

## 2021-11-18 DIAGNOSIS — G629 Polyneuropathy, unspecified: Secondary | ICD-10-CM

## 2021-11-18 DIAGNOSIS — M8580 Other specified disorders of bone density and structure, unspecified site: Secondary | ICD-10-CM

## 2021-11-18 DIAGNOSIS — Z79890 Hormone replacement therapy (postmenopausal): Secondary | ICD-10-CM | POA: Diagnosis not present

## 2021-11-18 DIAGNOSIS — E669 Obesity, unspecified: Secondary | ICD-10-CM

## 2021-11-18 LAB — COMPREHENSIVE METABOLIC PANEL
ALT: 10 U/L (ref 0–35)
AST: 14 U/L (ref 0–37)
Albumin: 4.5 g/dL (ref 3.5–5.2)
Alkaline Phosphatase: 67 U/L (ref 39–117)
BUN: 11 mg/dL (ref 6–23)
CO2: 26 mEq/L (ref 19–32)
Calcium: 9.3 mg/dL (ref 8.4–10.5)
Chloride: 102 mEq/L (ref 96–112)
Creatinine, Ser: 0.73 mg/dL (ref 0.40–1.20)
GFR: 85.1 mL/min (ref >60.00)
Glucose, Bld: 113 mg/dL — ABNORMAL HIGH (ref 70–99)
Potassium: 3.8 mEq/L (ref 3.5–5.1)
Sodium: 140 mEq/L (ref 135–145)
Total Bilirubin: 0.4 mg/dL (ref 0.2–1.2)
Total Protein: 7.1 g/dL (ref 6.0–8.3)

## 2021-11-18 LAB — B12 AND FOLATE PANEL
Folate: 20 ng/mL (ref >5.9)
Vitamin B-12: 190 pg/mL — ABNORMAL LOW (ref 211–911)

## 2021-11-18 LAB — IBC + FERRITIN
Ferritin: 43.1 ng/mL (ref 10.0–291.0)
Iron: 83 ug/dL (ref 42–145)
Saturation Ratios: 24.2 % (ref 20.0–50.0)
TIBC: 343 ug/dL (ref 250.0–450.0)
Transferrin: 245 mg/dL (ref 212.0–360.0)

## 2021-11-18 LAB — VITAMIN D 25 HYDROXY (VIT D DEFICIENCY, FRACTURES): VITD: 73.22 ng/mL (ref 30.00–100.00)

## 2021-11-18 LAB — CBC
HCT: 44.1 % (ref 36.0–46.0)
Hemoglobin: 14.9 g/dL (ref 12.0–15.0)
MCHC: 33.7 g/dL (ref 30.0–36.0)
MCV: 89 fl (ref 78.0–100.0)
Platelets: 226 10*3/uL (ref 150.0–400.0)
RBC: 4.96 Mil/uL (ref 3.87–5.11)
RDW: 12.6 % (ref 11.5–15.5)
WBC: 6.8 10*3/uL (ref 4.0–10.5)

## 2021-11-18 LAB — LIPID PANEL
Cholesterol: 194 mg/dL (ref 0–200)
HDL: 51.9 mg/dL (ref >39.00)
NonHDL: 142.39
Total CHOL/HDL Ratio: 4
Triglycerides: 236 mg/dL — ABNORMAL HIGH (ref 0.0–149.0)
VLDL: 47.2 mg/dL — ABNORMAL HIGH (ref 0.0–40.0)

## 2021-11-18 LAB — LDL CHOLESTEROL, DIRECT: Direct LDL: 108 mg/dL

## 2021-11-18 LAB — HEMOGLOBIN A1C: Hgb A1c MFr Bld: 5.6 % (ref 4.6–6.5)

## 2021-11-18 LAB — TSH: TSH: 1.11 u[IU]/mL (ref 0.35–5.50)

## 2021-11-18 MED ORDER — SHINGRIX 50 MCG/0.5ML IM SUSR: 0.5000 mL | Freq: Once | INTRAMUSCULAR | 1 refills | Status: AC
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
## 2021-11-18 NOTE — Patient Instructions (Signed)
Return in about 1 year (around 11/20/2022) for cpe (20 min).        Great to see you today.  I have refilled the medication(s) we provide.   If labs were collected, we will inform you of lab results once received either by echart message or telephone call.   - echart message- for normal results that have been seen by the patient already.   - telephone call: abnormal results or if patient has not viewed results in their echart.  Health Maintenance After Age 39 After age 28, you are at a higher risk for certain long-term diseases and infections as well as injuries from falls. Falls are a major cause of broken bones and head injuries in people who are older than age 46. Getting regular preventive care can help to keep you healthy and well. Preventive care includes getting regular testing and making lifestyle changes as recommended by your health care provider. Talk with your health care provider about: Which screenings and tests you should have. A screening is a test that checks for a disease when you have no symptoms. A diet and exercise plan that is right for you. What should I know about screenings and tests to prevent falls? Screening and testing are the best ways to find a health problem early. Early diagnosis and treatment give you the best chance of managing medical conditions that are common after age 47. Certain conditions and lifestyle choices may make you more likely to have a fall. Your health care provider may recommend: Regular vision checks. Poor vision and conditions such as cataracts can make you more likely to have a fall. If you wear glasses, make sure to get your prescription updated if your vision changes. Medicine review. Work with your health care provider to regularly review all of the medicines you are taking, including over-the-counter medicines. Ask your health care provider about any side effects that may make you more likely to have a fall. Tell your health care  provider if any medicines that you take make you feel dizzy or sleepy. Strength and balance checks. Your health care provider may recommend certain tests to check your strength and balance while standing, walking, or changing positions. Foot health exam. Foot pain and numbness, as well as not wearing proper footwear, can make you more likely to have a fall. Screenings, including: Osteoporosis screening. Osteoporosis is a condition that causes the bones to get weaker and break more easily. Blood pressure screening. Blood pressure changes and medicines to control blood pressure can make you feel dizzy. Depression screening. You may be more likely to have a fall if you have a fear of falling, feel depressed, or feel unable to do activities that you used to do. Alcohol use screening. Using too much alcohol can affect your balance and may make you more likely to have a fall. Follow these instructions at home: Lifestyle Do not drink alcohol if: Your health care provider tells you not to drink. If you drink alcohol: Limit how much you have to: 0-1 drink a day for women. 0-2 drinks a day for men. Know how much alcohol is in your drink. In the [place] one drink equals one 12 oz bottle of beer (355 mL), one 5 oz glass of wine (148 mL), or one 1 oz glass of hard liquor (44 mL). Do not use any products that contain nicotine or tobacco. These products include cigarettes, chewing tobacco, and vaping devices, such as e-cigarettes. If you need help quitting, ask  your health care provider. Activity  Follow a regular exercise program to stay fit. This will help you maintain your balance. Ask your health care provider what types of exercise are appropriate for you. If you need a cane or walker, use it as recommended by your health care provider. Wear supportive shoes that have nonskid soles. Safety  Remove any tripping hazards, such as rugs, cords, and clutter. Install safety equipment such as grab bars in  bathrooms and safety rails on stairs. Keep rooms and walkways well-lit. General instructions Talk with your health care provider about your risks for falling. Tell your health care provider if: You fall. Be sure to tell your health care provider about all falls, even ones that seem minor. You feel dizzy, tiredness (fatigue), or off-balance. Take over-the-counter and prescription medicines only as told by your health care provider. These include supplements. Eat a healthy diet and maintain a healthy weight. A healthy diet includes low-fat dairy products, low-fat (lean) meats, and fiber from whole grains, beans, and lots of fruits and vegetables. Stay current with your vaccines. Schedule regular health, dental, and eye exams. Summary Having a healthy lifestyle and getting preventive care can help to protect your health and wellness after age 70. Screening and testing are the best way to find a health problem early and help you avoid having a fall. Early diagnosis and treatment give you the best chance for managing medical conditions that are more common for people who are older than age 11. Falls are a major cause of broken bones and head injuries in people who are older than age 39. Take precautions to prevent a fall at home. Work with your health care provider to learn what changes you can make to improve your health and wellness and to prevent falls. This information is not intended to replace advice given to you by your health care provider. Make sure you discuss any questions you have with your health care provider. Document Revised: 07/30/2020 Document Reviewed: 07/30/2020 Elsevier Patient Education  [place].

## 2021-11-19 ENCOUNTER — Telehealth: Payer: Self-pay | Admitting: Family Medicine

## 2021-11-19 DIAGNOSIS — G500 Trigeminal neuralgia: Secondary | ICD-10-CM | POA: Diagnosis not present

## 2021-11-19 DIAGNOSIS — M9908 Segmental and somatic dysfunction of rib cage: Secondary | ICD-10-CM | POA: Diagnosis not present

## 2021-11-19 DIAGNOSIS — M26629 Arthralgia of temporomandibular joint, unspecified side: Secondary | ICD-10-CM | POA: Diagnosis not present

## 2021-11-19 DIAGNOSIS — M542 Cervicalgia: Secondary | ICD-10-CM | POA: Diagnosis not present

## 2021-11-19 DIAGNOSIS — M62838 Other muscle spasm: Secondary | ICD-10-CM | POA: Diagnosis not present

## 2021-11-19 DIAGNOSIS — M9901 Segmental and somatic dysfunction of cervical region: Secondary | ICD-10-CM | POA: Diagnosis not present

## 2021-11-19 DIAGNOSIS — M9900 Segmental and somatic dysfunction of head region: Secondary | ICD-10-CM | POA: Diagnosis not present

## 2021-11-19 DIAGNOSIS — M9907 Segmental and somatic dysfunction of upper extremity: Secondary | ICD-10-CM | POA: Diagnosis not present

## 2021-11-19 NOTE — Telephone Encounter (Signed)
Please call patient Liver, kidney and thyroid function are normal Blood cell counts and electrolytes are normal Diabetes screening/A1c is normal  Cholesterol panel looks at goal for nonfasting sample. Iron levels are normal. Vitamin D levels are normal. B12 levels are extremely low at 190.  She would benefit from B12 injections every 2 weeks.  If desiring please set her up for every 2-week nurse injection x4 and then we would retest with provider.   -If she does not want B12 injections, she can start 1000-2000 mcg of sublingual solution B12 daily.  This is over-the-counter.  It must be sublingual solution or she will not absorb.

## 2021-11-19 NOTE — Telephone Encounter (Signed)
Spoke with pt regarding labs and instructions.   

## 2021-11-20 ENCOUNTER — Ambulatory Visit (INDEPENDENT_AMBULATORY_CARE_PROVIDER_SITE_OTHER): Payer: Medicare Other

## 2021-11-20 DIAGNOSIS — E538 Deficiency of other specified B group vitamins: Secondary | ICD-10-CM | POA: Diagnosis not present

## 2021-11-20 MED ORDER — CYANOCOBALAMIN 1000 MCG/ML IJ SOLN
1000.0000 ug | INTRAMUSCULAR | Status: AC
  Administered 2021-11-20 – 2021-12-25 (×3): 1000 ug via INTRAMUSCULAR

## 2021-11-20 NOTE — Progress Notes (Signed)
Diana Gross is a 66 y.o. female presents to the office today for biweekly b12 injections per physician's orders. Original order:  She would benefit from B12 injections every 2 weeks.  If desiring please set her up for every 2-week nurse injection x4 and then we would retest with provider. Cyanocobalamin 107mg was administered Im left deltoid (location) today. Patient tolerated injection. Patient due for follow up labs/provider appt: Yes. Date due: 4 weeks, appt made No Patient next injection due: 2 weeks, appt made Yes  BEmilee Hero

## 2021-11-22 DIAGNOSIS — M62838 Other muscle spasm: Secondary | ICD-10-CM | POA: Diagnosis not present

## 2021-11-22 DIAGNOSIS — M26621 Arthralgia of right temporomandibular joint: Secondary | ICD-10-CM | POA: Diagnosis not present

## 2021-11-22 DIAGNOSIS — M26622 Arthralgia of left temporomandibular joint: Secondary | ICD-10-CM | POA: Diagnosis not present

## 2021-11-22 DIAGNOSIS — M5481 Occipital neuralgia: Secondary | ICD-10-CM | POA: Diagnosis not present

## 2021-12-03 DIAGNOSIS — M26629 Arthralgia of temporomandibular joint, unspecified side: Secondary | ICD-10-CM | POA: Diagnosis not present

## 2021-12-03 DIAGNOSIS — M9900 Segmental and somatic dysfunction of head region: Secondary | ICD-10-CM | POA: Diagnosis not present

## 2021-12-03 DIAGNOSIS — M9908 Segmental and somatic dysfunction of rib cage: Secondary | ICD-10-CM | POA: Diagnosis not present

## 2021-12-03 DIAGNOSIS — M9901 Segmental and somatic dysfunction of cervical region: Secondary | ICD-10-CM | POA: Diagnosis not present

## 2021-12-03 DIAGNOSIS — M62838 Other muscle spasm: Secondary | ICD-10-CM | POA: Diagnosis not present

## 2021-12-03 DIAGNOSIS — M9907 Segmental and somatic dysfunction of upper extremity: Secondary | ICD-10-CM | POA: Diagnosis not present

## 2021-12-03 DIAGNOSIS — M542 Cervicalgia: Secondary | ICD-10-CM | POA: Diagnosis not present

## 2021-12-03 DIAGNOSIS — G500 Trigeminal neuralgia: Secondary | ICD-10-CM | POA: Diagnosis not present

## 2021-12-10 ENCOUNTER — Ambulatory Visit (INDEPENDENT_AMBULATORY_CARE_PROVIDER_SITE_OTHER): Payer: Medicare Other

## 2021-12-10 DIAGNOSIS — E538 Deficiency of other specified B group vitamins: Secondary | ICD-10-CM

## 2021-12-10 NOTE — Progress Notes (Signed)
Diana Gross is a 66 y.o. female presents to the office today for biweekly b12 injections per physician's orders. Original order:  She would benefit from B12 injections every 2 weeks.  If desiring please set her up for every 2-week nurse injection x4 and then we would retest with provider. Cyanocobalamin 1000mcg was administered Im left deltoid (location) today. Patient tolerated injection. Patient due for follow up labs/provider appt: Yes. Date due: 4 weeks, appt made No Patient next injection due: 2 weeks, appt made Yes 

## 2021-12-12 ENCOUNTER — Ambulatory Visit: Payer: Medicare Other | Admitting: Podiatry

## 2021-12-12 ENCOUNTER — Encounter: Payer: Self-pay | Admitting: Podiatry

## 2021-12-12 DIAGNOSIS — M779 Enthesopathy, unspecified: Secondary | ICD-10-CM

## 2021-12-12 DIAGNOSIS — M722 Plantar fascial fibromatosis: Secondary | ICD-10-CM | POA: Diagnosis not present

## 2021-12-12 NOTE — Progress Notes (Signed)
  Subjective:  Patient ID: Diana Gross, female    DOB: 03-13-1956,   MRN: [id number]  Chief Complaint  Patient presents with   Foot Pain    Left heel pain on going for 3 months .Marland Kitchen Constant pain    66 y.o. female presents for concern of left heel pain. This has been ongoing for 3 months. She relates she has tried using voltaren on the area and switched to arnicare gel. She relates she does have constant pain and takes oxycodone every four hours. She does have some swelling as well. Relates she has tried [employer] and has a thin insert in but hoping to get advice on something more supportive.  Does have a history of bulging L5 disc and CRPS.  . Denies any other pedal complaints. Denies n/v/f/c.   Past Medical History:  Diagnosis Date   Abnormal uterine bleeding    prior to hysterectomy   Allergy    Anemia    prior to hysterectomy   Anxiety    Arthritis    right hip   Basal cell carcinoma 2015   Right shin.    BCC (basal cell carcinoma of skin)    Bulging lumbar disc    L5   Cervical pain    Complex regional pain syndrome    Depression    Epidemic cervical myalgia    Family history of breast cancer    Family history of glaucoma 07/02/2015   Fibroid    reason for Hysterectomy   Genetic testing 01/08/2017   Common Cancers panel (47 genes) @ [facility] - No pathogenic mutation detected   Heart murmur    Grade I   History of abnormal cervical Pap smear 1989   --hx colpo/cryotherapy to cervix    History of cranial surgery    with "complications" which has caused chronic pain    History of laminectomy    Hx of migraines    Neuromuscular disorder (HCC)    fibromyalgia, myofasical pain   Occipital neuralgia    Tic [place]    TMJ (dislocation of temporomandibular joint)    Trigeminal neuralgia    Urinary incontinence    Vaginal prolapse 2016   cystocele and rectocele    Objective:  Physical Exam: Vascular: DP/PT pulses 2/4 bilateral. CFT <3 seconds. Normal hair  growth on digits. No edema.  Skin. No lacerations or abrasions bilateral feet.  Musculoskeletal: MMT 5/5 bilateral lower extremities in DF, PF, Inversion and Eversion. Deceased ROM in DF of ankle joint. Tender to medial calcaneal tubercle on the left. No pain along achilles Pt or medial arch. No pain with calcaneal squeeze.  Neurological: Sensation intact to light touch.   Assessment:   1. Plantar fasciitis of left foot      Plan:  Patient was evaluated and treated and all questions answered. Discussed plantar fasciitis with patient.  X-rays reviewed and discussed with patient. No acute fractures or dislocations noted. Mild spurring noted at inferior calcaneus.  Discussed treatment options including, ice, NSAIDS, supportive shoes, bracing, and stretching. Stretching exercises provided to be done on a daily basis.   Discussed topical anti-inflammatories.  Discussed OTC vs custom orthotics. Dispensed Powersteps today and discussed if not doing well enough can consider CMO.  Defer injection today.  Follow-up 6 weeks or sooner if any problems arise. In the meantime, encouraged to call the office with any questions, concerns, change in symptoms.      Diana Gross, DPM

## 2021-12-12 NOTE — Patient Instructions (Signed)

## 2021-12-20 DIAGNOSIS — F331 Major depressive disorder, recurrent, moderate: Secondary | ICD-10-CM | POA: Diagnosis not present

## 2021-12-20 DIAGNOSIS — F451 Undifferentiated somatoform disorder: Secondary | ICD-10-CM | POA: Diagnosis not present

## 2021-12-20 DIAGNOSIS — F4481 Dissociative identity disorder: Secondary | ICD-10-CM | POA: Diagnosis not present

## 2021-12-25 ENCOUNTER — Ambulatory Visit (INDEPENDENT_AMBULATORY_CARE_PROVIDER_SITE_OTHER): Payer: Medicare Other

## 2021-12-25 DIAGNOSIS — E538 Deficiency of other specified B group vitamins: Secondary | ICD-10-CM

## 2021-12-25 NOTE — Progress Notes (Signed)
Diana Gross is a 66 y.o. female presents to the office today for biweekly b12 injections per physician's orders. Original order:  She would benefit from B12 injections every 2 weeks.  If desiring please set her up for every 2-week nurse injection x4 and then we would retest with provider. Cyanocobalamin 1000mcg was administered Im left deltoid (location) today. Patient tolerated injection. Patient due for follow up labs/provider appt: Yes. Date due: 4 weeks, appt made No Patient next injection due: 2 weeks, appt made Yes 

## 2021-12-26 DIAGNOSIS — F451 Undifferentiated somatoform disorder: Secondary | ICD-10-CM | POA: Diagnosis not present

## 2021-12-26 DIAGNOSIS — F331 Major depressive disorder, recurrent, moderate: Secondary | ICD-10-CM | POA: Diagnosis not present

## 2021-12-26 DIAGNOSIS — F4481 Dissociative identity disorder: Secondary | ICD-10-CM | POA: Diagnosis not present

## 2021-12-31 DIAGNOSIS — G501 Atypical facial pain: Secondary | ICD-10-CM | POA: Diagnosis not present

## 2022-01-08 ENCOUNTER — Ambulatory Visit (INDEPENDENT_AMBULATORY_CARE_PROVIDER_SITE_OTHER): Payer: Medicare Other

## 2022-01-08 ENCOUNTER — Ambulatory Visit: Payer: Medicare Other

## 2022-01-08 VITALS — Wt 215.0 lb

## 2022-01-08 DIAGNOSIS — Z0000 Encounter for general adult medical examination without abnormal findings: Secondary | ICD-10-CM

## 2022-01-08 NOTE — Patient Instructions (Signed)
[name] , Thank you for taking time to come for your Medicare Wellness Visit. I appreciate your ongoing commitment to your health goals. Please review the following plan we discussed and let me know if I can assist you in the future.   These are the goals we discussed:  Goals      Patient Stated     Would like to loose 40 more pounds Increase activity     Patient Stated     Lose weight      Weight (lb) < 160 lb (72.6 kg)     Lose weight by increasing activity on stationary bike and continue low carb diet.         This is a list of the screening recommended for you and due dates:  Health Maintenance  Topic Date Due   Zoster (Shingles) Vaccine (1 of 2) Never done   Flu Shot  06/22/2022*   Mammogram  11/15/2023   Cologuard (Stool DNA test)  01/18/2024   Tetanus Vaccine  01/28/2024   DEXA scan (bone density measurement)  03/01/2030   Pneumonia Vaccine  Completed   Hepatitis C Screening: USPSTF Recommendation to screen - Ages 50-79 yo.  Completed   HPV Vaccine  Aged Out   Colon Cancer Screening  Discontinued   COVID-19 Vaccine  Discontinued  *Topic was postponed. The date shown is not the original due date.    Advanced directives: Please bring a copy of your health care power of attorney and living will to the office at your convenience.  Conditions/risks identified: lose weight   Next appointment: Follow up in one year for your annual wellness visit    Preventive Care 65 Years and Older, Female Preventive care refers to lifestyle choices and visits with your health care provider that can promote health and wellness. What does preventive care include? A yearly physical exam. This is also called an annual well check. Dental exams once or twice a year. Routine eye exams. Ask your health care provider how often you should have your eyes checked. Personal lifestyle choices, including: Daily care of your teeth and gums. Regular physical activity. Eating a healthy  diet. Avoiding tobacco and drug use. Limiting alcohol use. Practicing safe sex. Taking low-dose aspirin every day. Taking vitamin and mineral supplements as recommended by your health care provider. What happens during an annual well check? The services and screenings done by your health care provider during your annual well check will depend on your age, overall health, lifestyle risk factors, and family history of disease. Counseling  Your health care provider may ask you questions about your: Alcohol use. Tobacco use. Drug use. Emotional well-being. Home and relationship well-being. Sexual activity. Eating habits. History of falls. Memory and ability to understand (cognition). Work and work Statistician. Reproductive health. Screening  You may have the following tests or measurements: Height, weight, and BMI. Blood pressure. Lipid and cholesterol levels. These may be checked every 5 years, or more frequently if you are over 1 years old. Skin check. Lung cancer screening. You may have this screening every year starting at age 67 if you have a 30-pack-year history of smoking and currently smoke or have quit within the past 15 years. Fecal occult blood test (FOBT) of the stool. You may have this test every year starting at age 67. Flexible sigmoidoscopy or colonoscopy. You may have a sigmoidoscopy every 5 years or a colonoscopy every 10 years starting at age 67. Hepatitis C blood test. Hepatitis B blood  test. Sexually transmitted disease (STD) testing. Diabetes screening. This is done by checking your blood sugar (glucose) after you have not eaten for a while (fasting). You may have this done every 1-3 years. Bone density scan. This is done to screen for osteoporosis. You may have this done starting at age 67. Mammogram. This may be done every 1-2 years. Talk to your health care provider about how often you should have regular mammograms. Talk with your health care provider about  your test results, treatment options, and if necessary, the need for more tests. Vaccines  Your health care provider may recommend certain vaccines, such as: Influenza vaccine. This is recommended every year. Tetanus, diphtheria, and acellular pertussis (Tdap, Td) vaccine. You may need a Td booster every 10 years. Zoster vaccine. You may need this after age 20. Pneumococcal 13-valent conjugate (PCV13) vaccine. One dose is recommended after age 21. Pneumococcal polysaccharide (PPSV23) vaccine. One dose is recommended after age 67. Talk to your health care provider about which screenings and vaccines you need and how often you need them. This information is not intended to replace advice given to you by your health care provider. Make sure you discuss any questions you have with your health care provider. Document Released: 04/06/2015 Document Revised: 11/28/2015 Document Reviewed: 01/09/2015 Elsevier Interactive Patient Education  2017 [place] Prevention in the Home Falls can cause injuries. They can happen to people of all ages. There are many things you can do to make your home safe and to help prevent falls. What can I do on the outside of my home? Regularly fix the edges of walkways and driveways and fix any cracks. Remove anything that might make you trip as you walk through a door, such as a raised step or threshold. Trim any bushes or trees on the path to your home. Use bright outdoor lighting. Clear any walking paths of anything that might make someone trip, such as rocks or tools. Regularly check to see if handrails are loose or broken. Make sure that both sides of any steps have handrails. Any raised decks and porches should have guardrails on the edges. Have any leaves, snow, or ice cleared regularly. Use sand or salt on walking paths during winter. Clean up any spills in your garage right away. This includes oil or grease spills. What can I do in the bathroom? Use  night lights. Install grab bars by the toilet and in the tub and shower. Do not use towel bars as grab bars. Use non-skid mats or decals in the tub or shower. If you need to sit down in the shower, use a plastic, non-slip stool. Keep the floor dry. Clean up any water that spills on the floor as soon as it happens. Remove soap buildup in the tub or shower regularly. Attach bath mats securely with double-sided non-slip rug tape. Do not have throw rugs and other things on the floor that can make you trip. What can I do in the bedroom? Use night lights. Make sure that you have a light by your bed that is easy to reach. Do not use any sheets or blankets that are too big for your bed. They should not hang down onto the floor. Have a firm chair that has side arms. You can use this for support while you get dressed. Do not have throw rugs and other things on the floor that can make you trip. What can I do in the kitchen? Clean up any spills right  away. Avoid walking on wet floors. Keep items that you use a lot in easy-to-reach places. If you need to reach something above you, use a strong step stool that has a grab bar. Keep electrical cords out of the way. Do not use floor polish or wax that makes floors slippery. If you must use wax, use non-skid floor wax. Do not have throw rugs and other things on the floor that can make you trip. What can I do with my stairs? Do not leave any items on the stairs. Make sure that there are handrails on both sides of the stairs and use them. Fix handrails that are broken or loose. Make sure that handrails are as long as the stairways. Check any carpeting to make sure that it is firmly attached to the stairs. Fix any carpet that is loose or worn. Avoid having throw rugs at the top or bottom of the stairs. If you do have throw rugs, attach them to the floor with carpet tape. Make sure that you have a light switch at the top of the stairs and the bottom of the  stairs. If you do not have them, ask someone to add them for you. What else can I do to help prevent falls? Wear shoes that: Do not have high heels. Have rubber bottoms. Are comfortable and fit you well. Are closed at the toe. Do not wear sandals. If you use a stepladder: Make sure that it is fully opened. Do not climb a closed stepladder. Make sure that both sides of the stepladder are locked into place. Ask someone to hold it for you, if possible. Clearly mark and make sure that you can see: Any grab bars or handrails. First and last steps. Where the edge of each step is. Use tools that help you move around (mobility aids) if they are needed. These include: Canes. Walkers. Scooters. Crutches. Turn on the lights when you go into a dark area. Replace any light bulbs as soon as they burn out. Set up your furniture so you have a clear path. Avoid moving your furniture around. If any of your floors are uneven, fix them. If there are any pets around you, be aware of where they are. Review your medicines with your doctor. Some medicines can make you feel dizzy. This can increase your chance of falling. Ask your doctor what other things that you can do to help prevent falls. This information is not intended to replace advice given to you by your health care provider. Make sure you discuss any questions you have with your health care provider. Document Released: 01/04/2009 Document Revised: 08/16/2015 Document Reviewed: 04/14/2014 Elsevier Interactive Patient Education  2017 [employer].
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
## 2022-01-13 ENCOUNTER — Encounter: Payer: Self-pay | Admitting: *Deleted

## 2022-01-14 DIAGNOSIS — G500 Trigeminal neuralgia: Secondary | ICD-10-CM | POA: Diagnosis not present

## 2022-01-14 DIAGNOSIS — M9900 Segmental and somatic dysfunction of head region: Secondary | ICD-10-CM | POA: Diagnosis not present

## 2022-01-14 DIAGNOSIS — M9901 Segmental and somatic dysfunction of cervical region: Secondary | ICD-10-CM | POA: Diagnosis not present

## 2022-01-14 DIAGNOSIS — M26629 Arthralgia of temporomandibular joint, unspecified side: Secondary | ICD-10-CM | POA: Diagnosis not present

## 2022-01-21 ENCOUNTER — Ambulatory Visit (INDEPENDENT_AMBULATORY_CARE_PROVIDER_SITE_OTHER): Payer: Medicare Other

## 2022-01-21 DIAGNOSIS — E538 Deficiency of other specified B group vitamins: Secondary | ICD-10-CM | POA: Diagnosis not present

## 2022-01-21 MED ORDER — CYANOCOBALAMIN 1000 MCG/ML IJ SOLN
1000.0000 ug | Freq: Once | INTRAMUSCULAR | Status: AC
  Administered 2022-01-21: 1000 ug via INTRAMUSCULAR

## 2022-01-21 NOTE — Progress Notes (Signed)
Diana Gross is a 66 y.o. female presents to the office today for biweekly b12 injections per physician's orders. Original order:  She would benefit from B12 injections every 2 weeks.  If desiring please set her up for every 2-week nurse injection x4 and then we would retest with provider. Cyanocobalamin 1032mg was administered Im left deltoid (location) today. Patient tolerated injection. Patient due for follow up labs/provider appt: Yes. Date due: 4 weeks, appt made No Patient next injection due: 2 weeks, appt made Yes

## 2022-01-22 DIAGNOSIS — G894 Chronic pain syndrome: Secondary | ICD-10-CM | POA: Diagnosis not present

## 2022-01-22 DIAGNOSIS — M62838 Other muscle spasm: Secondary | ICD-10-CM | POA: Diagnosis not present

## 2022-01-22 DIAGNOSIS — G501 Atypical facial pain: Secondary | ICD-10-CM | POA: Diagnosis not present

## 2022-01-22 DIAGNOSIS — M5481 Occipital neuralgia: Secondary | ICD-10-CM | POA: Diagnosis not present

## 2022-01-23 ENCOUNTER — Ambulatory Visit: Payer: Medicare Other | Admitting: Podiatrist

## 2022-01-23 ENCOUNTER — Encounter: Payer: Self-pay | Admitting: Podiatrist

## 2022-01-23 DIAGNOSIS — M722 Plantar fascial fibromatosis: Secondary | ICD-10-CM | POA: Diagnosis not present

## 2022-01-23 MED ORDER — TRIAMCINOLONE ACETONIDE 40 MG/ML IJ SUSP
20.0000 mg | Freq: Once | INTRAMUSCULAR | Status: AC
  Administered 2022-01-23: 20 mg

## 2022-01-23 NOTE — Progress Notes (Signed)
Subjective: Diana Gross is a 66 y.o. female patient presents to office for follow up of moderate heel pain on the left  heel.  She has been using the powerstep inserts along with the HOKA shoes and has noticed some relief.  She has also been icing and stretching and relates there is still a small area on her plantar heel that is painful with pressure.    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 12/23/2018   Nonallopathic lesion of cervical region 06/12/2015   Nonallopathic lesion of thoracic region 06/12/2015   Nonallopathic lesion of lumbosacral region 06/12/2015   Somatic dysfunction 05/01/2015   Hormone replacement therapy 05/01/2015   Chronic pain syndrome 05/01/2015   Fibromyalgia 05/01/2015   Chronic narcotic use 05/01/2015   Occipital neuralgia 05/01/2015   Trigeminal neuralgia 05/01/2015    Current Outpatient Medications on File Prior to Visit  Medication Sig Dispense Refill   ALPRAZolam (XANAX) 1 MG tablet Takes 1/2 to 1 tablet up to 3 times a day  0   Cholecalciferol 50 MCG (2000 UT) CAPS Take 2,000 Units by mouth daily.     diphenhydrAMINE (BENADRYL) 25 MG tablet Take 25 mg by mouth as needed.     docusate sodium (COLACE) 100 MG capsule Take 100 mg by mouth 2 (two) times daily.     estradiol (CLIMARA - DOSED IN MG/24 HR) 0.05 mg/24hr patch Place 1 patch (0.05 mg total) onto the skin once a week. 12 patch 3   estradiol (ESTRACE) 0.1 MG/GM vaginal cream Place 1 Applicatorful vaginally at bedtime.     famotidine (PEPCID) 10 MG tablet Take 10 mg by mouth as needed.     fexofenadine (ALLEGRA) 180 MG tablet Take 180 mg by mouth as needed.      fluticasone  (FLONASE) 50 MCG/ACT nasal spray Place into both nostrils as needed.      magnesium 30 MG tablet Take by mouth.      NARCAN 4 MG/0.1ML LIQD nasal spray kit      ondansetron (ZOFRAN-ODT) 4 MG disintegrating tablet Take 4 mg by mouth every 4 (four) hours as needed for nausea or vomiting.      oxyCODONE (ROXICODONE) 15 MG immediate release tablet Take 1 tablet by mouth every 4 (four) hours.  0   OXYCONTIN 15 MG 12 hr tablet Take 45 mg by mouth. One every 8 hours  15 mg  0   SUMAtriptan (IMITREX) 50 MG tablet as needed.   0   tiZANidine (ZANAFLEX) 4 MG tablet Takes 1/2 when needed     No current facility-administered medications on file prior to visit.    Allergies  Allergen Reactions   Amitriptyline Rash    Other Reaction: involuntary movements   Baclofen Diarrhea and Other (See Comments)    Other Reaction: rigid muscle, cramping   Bupropion Other (See Comments)    Other Reaction: insominia, severe agitation   Clonazepam Itching   Duloxetine Rash    Other Reaction: high blood sugar, skin rash   Fluoxetine Anxiety    Other Reaction: insomnia, severe agitation   Gabapentin Other (See Comments)    Other Reaction: throat closing up, hallucinates   Oxcarbazepine Rash   Paroxetine Hcl Anxiety  Other Reaction: insomnia, severe agitation   Penicillins Nausea And Vomiting   Tapentadol Rash   Topiramate     Other reaction(s): Other (See Comments) Other Reaction: inconttinence/urine   Tramadol Anxiety    Other Reaction: heart racing/ panic attacks   Carbamazepine Rash   Carisoprodol Nausea Only   Ibuprofen Rash   Metaxalone Other (See Comments)    Other Reaction: Other reaction   Morphine Rash   Naproxen Sodium Rash   Oxymorphone Rash   Amoxicillin Diarrhea and Nausea Only   Aspirin    Augmentin [Amoxicillin-Pot Clavulanate] Nausea Only   Decadron  [Dexamethasone]     Other reaction(s): Unknown Uncoded Allergy. Allergen: DECADRON   Fentanyl Other (See Comments)    Suicidal  ideation   Keflex [Cephalexin]     rash   Pregabalin Swelling    Other Reaction: swelling of hands/feet   Prozac [Fluoxetine Hcl] Other (See Comments)    insomnia   Ativan [Lorazepam] Anxiety   Celebrex [Celecoxib] Rash   Cymbalta [Duloxetine Hcl] Rash    Elevated blood sugar   Flexeril [Cyclobenzaprine] Rash and Itching   Nortriptyline Rash   Opana [Oxymorphone Hcl] Rash   Valium [Diazepam] Rash   Vimpat [Lacosamide] Rash   Zorvolex [Diclofenac] Anxiety    Objective: Physical Exam General: The patient is alert and oriented x3 in no acute distress.  Dermatology: Skin is warm, dry and supple bilateral lower extremities. Nails 1-10 are normal. There is no erythema, edema, no eccymosis, no open lesions present. Integument is otherwise unremarkable.  Vascular: Dorsalis Pedis pulse and Posterior Tibial pulse are 2/4 bilateral. Capillary fill time is immediate to all digits.  Neurological: Grossly intact to light touch with an achilles reflex of +2/5 and a  negative Tinel's sign bilateral.  Musculoskeletal: Tenderness to palpation at the medial calcaneal tubercale and through the insertion of the plantar fascia on the Left foot. No pain with compression of calcaneus bilateral. No pain with tuning fork to calcaneus bilateral. No pain with calf compression bilateral. There is decreased Ankle joint range of motion bilateral. All other joints range of motion within normal limits bilateral. Strength 5/5 in all groups bilateral.     Assessment and Plan:   ICD-10-CM   1. Plantar fasciitis of left foot  M72.2         - Discussed the treatments she has already tried and discussed further treatment options. At this time I feel she would benefit from a steroid injection.  She agreed and wishes to proceed with injection #1.  -After oral consent and aseptic prep, injected a mixture containing 8m Kenalog and 566mmarcaine plain was infiltrated into the area of maximal tenderness of the Left  heel. Post-injection care discussed with patient.  -Recommended continued use of her good supportive shoes and advised continued use of OTC insert.  - Explained custom orthotics are often times prescribed for plantar facsiitis and are beneficial for long term prevention. They may be considered depending upon the success of the injection and stretching therapy.  - dispensed to patient daily stretching exercises.. -Patient to return to office in 4 weeks if symptoms worsen or fail to improve.

## 2022-01-24 ENCOUNTER — Other Ambulatory Visit: Payer: Self-pay | Admitting: Nurse Practitioner

## 2022-01-24 DIAGNOSIS — Z79890 Hormone replacement therapy (postmenopausal): Secondary | ICD-10-CM

## 2022-01-24 NOTE — Telephone Encounter (Signed)
Last annual exam 02/2021 No exam scheduled

## 2022-01-28 DIAGNOSIS — G500 Trigeminal neuralgia: Secondary | ICD-10-CM | POA: Diagnosis not present

## 2022-01-28 DIAGNOSIS — M9900 Segmental and somatic dysfunction of head region: Secondary | ICD-10-CM | POA: Diagnosis not present

## 2022-01-28 DIAGNOSIS — M542 Cervicalgia: Secondary | ICD-10-CM | POA: Diagnosis not present

## 2022-01-28 DIAGNOSIS — R0781 Pleurodynia: Secondary | ICD-10-CM | POA: Diagnosis not present

## 2022-01-28 DIAGNOSIS — M9901 Segmental and somatic dysfunction of cervical region: Secondary | ICD-10-CM | POA: Diagnosis not present

## 2022-01-28 DIAGNOSIS — M26629 Arthralgia of temporomandibular joint, unspecified side: Secondary | ICD-10-CM | POA: Diagnosis not present

## 2022-01-28 DIAGNOSIS — M9908 Segmental and somatic dysfunction of rib cage: Secondary | ICD-10-CM | POA: Diagnosis not present

## 2022-01-28 DIAGNOSIS — M9907 Segmental and somatic dysfunction of upper extremity: Secondary | ICD-10-CM | POA: Diagnosis not present

## 2022-01-30 ENCOUNTER — Other Ambulatory Visit: Payer: Self-pay | Admitting: Nurse Practitioner

## 2022-01-30 ENCOUNTER — Ambulatory Visit: Payer: Medicare Other | Admitting: Nurse Practitioner

## 2022-01-30 ENCOUNTER — Encounter: Payer: Self-pay | Admitting: Nurse Practitioner

## 2022-01-30 VITALS — BP 148/80 | HR 78 | Resp 14 | Ht 68.0 in | Wt 209.0 lb

## 2022-01-30 DIAGNOSIS — Z79890 Hormone replacement therapy (postmenopausal): Secondary | ICD-10-CM | POA: Diagnosis not present

## 2022-01-30 DIAGNOSIS — R32 Unspecified urinary incontinence: Secondary | ICD-10-CM | POA: Diagnosis not present

## 2022-01-30 DIAGNOSIS — N8110 Cystocele, unspecified: Secondary | ICD-10-CM | POA: Diagnosis not present

## 2022-01-30 DIAGNOSIS — N816 Rectocele: Secondary | ICD-10-CM

## 2022-01-30 DIAGNOSIS — N958 Other specified menopausal and perimenopausal disorders: Secondary | ICD-10-CM

## 2022-01-30 DIAGNOSIS — M85852 Other specified disorders of bone density and structure, left thigh: Secondary | ICD-10-CM | POA: Diagnosis not present

## 2022-01-30 DIAGNOSIS — N393 Stress incontinence (female) (male): Secondary | ICD-10-CM

## 2022-01-30 MED ORDER — ESTRADIOL 0.1 MG/GM VA CREA: 1.0000 | TOPICAL_CREAM | VAGINAL | 2 refills | Status: DC

## 2022-01-30 MED ORDER — ESTRADIOL 0.05 MG/24HR TD PTWK: 0.0500 mg | MEDICATED_PATCH | TRANSDERMAL | 3 refills | Status: DC

## 2022-01-30 NOTE — Progress Notes (Signed)
   Acute Office Visit  Subjective:    Patient ID: Diana Gross, female    DOB: [birth year], 66 y.o.   MRN: [id number]   HPI 66 y.o. G1P1001 presents today for ERT management and pelvic organ prolapse. On estradiol patches with good management of symptoms. Has tried to wean but does not tolerate. Also feels it help with her trigeminal neuralgia. Sees [facility] for management of trigeminal neuralgia and they agree with continuing. Using vaginal estrogen for recurrent UTIs with good management. Saw urogynecology last year for rectocele and cystocele but is wanting to see someone from [place] that her friend sees but that provider does not take her insurance. Has incontinence and has to splint for fecal and urinary voiding to fully empty. Had episode of full incontinence during sleep 2-3 weeks ago. No other urinary symptoms. S/P 1997 TVH. Due for DXA.    Review of Systems  Constitutional: Negative.   Gastrointestinal:        Has to splint during bowel movements  Genitourinary:  Positive for difficulty urinating (Has to splint to fully empty bladder). Negative for dysuria, flank pain, frequency, pelvic pain and urgency.       Stress incontinence       Objective:    Physical Exam Constitutional:      Appearance: Normal appearance. She is obese.  Genitourinary:    General: Normal vulva.     Vagina: Normal.     Uterus: Absent.      Comments: Grade 1 cystocele with grade 2-3 rectocele    BP (!) 148/80   Pulse 78   Resp 14   Ht '5\' 8"'$  (1.727 m)   Wt 209 lb (94.8 kg)   LMP  (LMP Unknown)   BMI 31.78 kg/m  Wt Readings from Last 3 Encounters:  01/30/22 209 lb (94.8 kg)  01/08/22 215 lb (97.5 kg)  11/18/21 217 lb (98.4 kg)        Patient informed chaperone available to be present for breast and/or pelvic exam. Patient has requested no chaperone to be present. Patient has been advised what will be completed during breast and pelvic exam.   UA unremarkable  Assessment & Plan:    Problem List Items Addressed This Visit   None Visit Diagnoses     Postmenopausal hormone therapy    -  Primary   Relevant Medications   estradiol (CLIMARA - DOSED IN MG/24 HR) 0.05 mg/24hr patch   Urinary incontinence, unspecified type       Relevant Orders   Urinalysis,Complete w/RFL Culture (Completed)   Osteopenia of neck of left femur       Relevant Orders   DG Bone Density   Cystocele with rectocele       Genitourinary syndrome of menopause       Relevant Medications   estradiol (ESTRACE) 0.1 MG/GM vaginal cream      Plan: UA unremarkable. Recommend pelvic floor PT and she is agreeable. If no improvement she will see surgeon again to discuss options. Refills provided on vaginal estrogen and estradiol patch. Will schedule DXA in December.      Tamela Gammon DNP, 12:31 PM 01/30/2022

## 2022-02-01 LAB — URINE CULTURE
MICRO NUMBER:: 14167133
SPECIMEN QUALITY:: ADEQUATE

## 2022-02-01 LAB — URINALYSIS, COMPLETE W/RFL CULTURE
Bilirubin Urine: NEGATIVE
Casts: NONE SEEN /LPF
Crystals: NONE SEEN /HPF
Glucose, UA: NEGATIVE
Hgb urine dipstick: NEGATIVE
Hyaline Cast: NONE SEEN /LPF
Leukocyte Esterase: NEGATIVE
Nitrites, Initial: NEGATIVE
Protein, ur: NEGATIVE
RBC / HPF: NONE SEEN /HPF (ref 0–2)
Specific Gravity, Urine: 1.02 (ref 1.001–1.035)
WBC, UA: NONE SEEN /HPF (ref 0–5)
Yeast: NONE SEEN /HPF
pH: 6 (ref 5.0–8.0)

## 2022-02-01 LAB — CULTURE INDICATED

## 2022-02-05 ENCOUNTER — Ambulatory Visit (INDEPENDENT_AMBULATORY_CARE_PROVIDER_SITE_OTHER): Payer: Medicare Other | Admitting: Family Medicine

## 2022-02-05 ENCOUNTER — Encounter: Payer: Self-pay | Admitting: Family Medicine

## 2022-02-05 VITALS — BP 137/79 | HR 67 | Temp 98.7°F | Wt 218.4 lb

## 2022-02-05 DIAGNOSIS — M797 Fibromyalgia: Secondary | ICD-10-CM | POA: Diagnosis not present

## 2022-02-05 DIAGNOSIS — E538 Deficiency of other specified B group vitamins: Secondary | ICD-10-CM | POA: Diagnosis not present

## 2022-02-05 LAB — VITAMIN B12: Vitamin B-12: 427 pg/mL (ref 211–911)

## 2022-02-05 MED ORDER — CYANOCOBALAMIN 1000 MCG/ML IJ SOLN
1000.0000 ug | Freq: Once | INTRAMUSCULAR | Status: AC
  Administered 2022-02-05: 1000 ug via INTRAMUSCULAR

## 2022-02-05 NOTE — Addendum Note (Signed)
Addended by: Beatrix Fetters on: 02/05/2022 02:25 PM   Modules accepted: Orders
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
## 2022-02-06 MED ORDER — CYANOCOBALAMIN 1000 MCG/ML IJ SOLN: 1000.0000 ug | INTRAMUSCULAR | Status: AC

## 2022-02-06 NOTE — Addendum Note (Signed)
Addended by: Beatrix Fetters on: 02/06/2022 09:13 AM   Modules accepted: Orders

## 2022-02-20 ENCOUNTER — Ambulatory Visit: Payer: Medicare Other | Attending: Nurse Practitioner

## 2022-02-20 ENCOUNTER — Other Ambulatory Visit: Payer: Self-pay

## 2022-02-20 DIAGNOSIS — R279 Unspecified lack of coordination: Secondary | ICD-10-CM | POA: Diagnosis not present

## 2022-02-20 DIAGNOSIS — N393 Stress incontinence (female) (male): Secondary | ICD-10-CM | POA: Diagnosis not present

## 2022-02-20 DIAGNOSIS — N8110 Cystocele, unspecified: Secondary | ICD-10-CM | POA: Diagnosis not present

## 2022-02-20 DIAGNOSIS — N816 Rectocele: Secondary | ICD-10-CM | POA: Insufficient documentation

## 2022-02-20 DIAGNOSIS — R293 Abnormal posture: Secondary | ICD-10-CM

## 2022-02-20 DIAGNOSIS — M62838 Other muscle spasm: Secondary | ICD-10-CM

## 2022-02-20 DIAGNOSIS — M6281 Muscle weakness (generalized): Secondary | ICD-10-CM

## 2022-02-20 NOTE — Patient Instructions (Signed)
Femmeze - for splinting  Double-voiding:  This technique is to help with post-void dribbling, or leaking a little bit when you stand up right after urinating.  Use relaxed toileting mechanics to urinate as much as you feel like you have to without straining.  Sit back upright from leaning forward and relax this way for 10-20 seconds.  Lean forward again to finish voiding any amount more.  Laugh, hum, take deep breaths to help relax all of these muscles.    Squatty potty: When your knees are level or below the level of your hips, pelvic floor muscles are pressed against rectum, preventing ease of bowel movement. By getting knees above the level of the hips, these pelvic floor muscles relax, allowing easier passage of bowel movement. ? Ways to get knees above hips: o Squatty Potty (7inch and 9inch versions) o Small stool o Roll of toilet paper under each foot o Hardback book or stack of magazines under each foot  Relaxed Toileting mechanics: Once in this position, make sure to lean forward with forearms on thighs, wide knees, relaxed stomach, and breathe. *For you when trying to have bowel movement, try leaning backwards as well.     [facility] [address] [place], [place] [address] Phone # [phone] Fax [phone]
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
## 2022-02-21 ENCOUNTER — Ambulatory Visit: Payer: Medicare Other | Admitting: Podiatrist

## 2022-02-25 DIAGNOSIS — M26622 Arthralgia of left temporomandibular joint: Secondary | ICD-10-CM | POA: Diagnosis not present

## 2022-03-10 ENCOUNTER — Other Ambulatory Visit: Payer: Self-pay | Admitting: Nurse Practitioner

## 2022-03-10 ENCOUNTER — Ambulatory Visit (INDEPENDENT_AMBULATORY_CARE_PROVIDER_SITE_OTHER): Payer: Medicare Other

## 2022-03-10 DIAGNOSIS — Z780 Asymptomatic menopausal state: Secondary | ICD-10-CM

## 2022-03-10 DIAGNOSIS — N8110 Cystocele, unspecified: Secondary | ICD-10-CM

## 2022-03-10 DIAGNOSIS — R32 Unspecified urinary incontinence: Secondary | ICD-10-CM

## 2022-03-10 DIAGNOSIS — Z13820 Encounter for screening for osteoporosis: Secondary | ICD-10-CM

## 2022-03-10 DIAGNOSIS — M85852 Other specified disorders of bone density and structure, left thigh: Secondary | ICD-10-CM

## 2022-03-10 DIAGNOSIS — Z79890 Hormone replacement therapy (postmenopausal): Secondary | ICD-10-CM

## 2022-03-10 DIAGNOSIS — N958 Other specified menopausal and perimenopausal disorders: Secondary | ICD-10-CM

## 2022-03-13 ENCOUNTER — Ambulatory Visit: Payer: Medicare Other | Attending: Nurse Practitioner

## 2022-03-13 DIAGNOSIS — R279 Unspecified lack of coordination: Secondary | ICD-10-CM | POA: Diagnosis not present

## 2022-03-13 DIAGNOSIS — R293 Abnormal posture: Secondary | ICD-10-CM | POA: Diagnosis not present

## 2022-03-13 DIAGNOSIS — M62838 Other muscle spasm: Secondary | ICD-10-CM | POA: Insufficient documentation

## 2022-03-13 DIAGNOSIS — M6281 Muscle weakness (generalized): Secondary | ICD-10-CM | POA: Insufficient documentation
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
## 2022-03-19 DIAGNOSIS — M62838 Other muscle spasm: Secondary | ICD-10-CM | POA: Diagnosis not present

## 2022-03-19 DIAGNOSIS — M5481 Occipital neuralgia: Secondary | ICD-10-CM | POA: Diagnosis not present

## 2022-03-19 DIAGNOSIS — G501 Atypical facial pain: Secondary | ICD-10-CM | POA: Diagnosis not present

## 2022-03-19 DIAGNOSIS — G894 Chronic pain syndrome: Secondary | ICD-10-CM | POA: Diagnosis not present

## 2022-03-23 ENCOUNTER — Other Ambulatory Visit: Payer: Self-pay | Admitting: Nurse Practitioner

## 2022-03-23 DIAGNOSIS — N958 Other specified menopausal and perimenopausal disorders: Secondary | ICD-10-CM

## 2022-03-27 ENCOUNTER — Ambulatory Visit: Payer: Medicare Other | Attending: Nurse Practitioner

## 2022-03-27 DIAGNOSIS — R279 Unspecified lack of coordination: Secondary | ICD-10-CM | POA: Diagnosis not present

## 2022-03-27 DIAGNOSIS — M62838 Other muscle spasm: Secondary | ICD-10-CM | POA: Diagnosis not present

## 2022-03-27 DIAGNOSIS — M6281 Muscle weakness (generalized): Secondary | ICD-10-CM | POA: Diagnosis not present

## 2022-03-27 DIAGNOSIS — R293 Abnormal posture: Secondary | ICD-10-CM | POA: Insufficient documentation
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
## 2022-04-02 ENCOUNTER — Ambulatory Visit: Payer: Medicare Other

## 2022-04-02 DIAGNOSIS — R279 Unspecified lack of coordination: Secondary | ICD-10-CM

## 2022-04-02 DIAGNOSIS — R293 Abnormal posture: Secondary | ICD-10-CM | POA: Diagnosis not present

## 2022-04-02 DIAGNOSIS — M62838 Other muscle spasm: Secondary | ICD-10-CM

## 2022-04-02 DIAGNOSIS — M6281 Muscle weakness (generalized): Secondary | ICD-10-CM
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
## 2022-04-07 DIAGNOSIS — M5481 Occipital neuralgia: Secondary | ICD-10-CM | POA: Diagnosis not present

## 2022-04-07 DIAGNOSIS — M62838 Other muscle spasm: Secondary | ICD-10-CM | POA: Diagnosis not present

## 2022-04-08 DIAGNOSIS — M542 Cervicalgia: Secondary | ICD-10-CM | POA: Diagnosis not present

## 2022-04-08 DIAGNOSIS — R0781 Pleurodynia: Secondary | ICD-10-CM | POA: Diagnosis not present

## 2022-04-08 DIAGNOSIS — M9908 Segmental and somatic dysfunction of rib cage: Secondary | ICD-10-CM | POA: Diagnosis not present

## 2022-04-08 DIAGNOSIS — M9901 Segmental and somatic dysfunction of cervical region: Secondary | ICD-10-CM | POA: Diagnosis not present

## 2022-04-08 DIAGNOSIS — G5643 Causalgia of bilateral upper limbs: Secondary | ICD-10-CM | POA: Diagnosis not present

## 2022-04-08 DIAGNOSIS — M9900 Segmental and somatic dysfunction of head region: Secondary | ICD-10-CM | POA: Diagnosis not present

## 2022-04-08 DIAGNOSIS — M9907 Segmental and somatic dysfunction of upper extremity: Secondary | ICD-10-CM | POA: Diagnosis not present

## 2022-04-08 DIAGNOSIS — M5382 Other specified dorsopathies, cervical region: Secondary | ICD-10-CM | POA: Diagnosis not present

## 2022-04-14 ENCOUNTER — Ambulatory Visit: Payer: Medicare Other

## 2022-04-16 ENCOUNTER — Encounter: Payer: Self-pay | Admitting: Family Medicine

## 2022-04-20 ENCOUNTER — Other Ambulatory Visit: Payer: Self-pay | Admitting: Nurse Practitioner

## 2022-04-20 DIAGNOSIS — Z79890 Hormone replacement therapy (postmenopausal): Secondary | ICD-10-CM

## 2022-04-22 DIAGNOSIS — G501 Atypical facial pain: Secondary | ICD-10-CM | POA: Diagnosis not present

## 2022-04-22 NOTE — Telephone Encounter (Signed)
FYI. Last AEX 02/25/2021--Medicare pt (Risk unknown), last seen on 01/30/2022, recall placed for 01/2023 for B&P. Last mammo-11/11/2021-neg birads 1  Rx sent for #12 patches w/ 3 refills to [facility] on 01/30/2022.  Will inquire from pt if that was correct pharmacy since request is coming from [facility] location.   Husband Randall Hiss answered the phone and I just confirmed with him per [facility] that they have been using [facility] recently for all Rxs so will refuse this request for now.

## 2022-04-28 ENCOUNTER — Ambulatory Visit: Payer: Medicare Other | Attending: Nurse Practitioner

## 2022-04-28 DIAGNOSIS — G8929 Other chronic pain: Secondary | ICD-10-CM | POA: Diagnosis not present

## 2022-04-28 DIAGNOSIS — R279 Unspecified lack of coordination: Secondary | ICD-10-CM | POA: Insufficient documentation

## 2022-04-28 DIAGNOSIS — R293 Abnormal posture: Secondary | ICD-10-CM | POA: Insufficient documentation

## 2022-04-28 DIAGNOSIS — M6281 Muscle weakness (generalized): Secondary | ICD-10-CM | POA: Diagnosis not present

## 2022-04-28 DIAGNOSIS — M25512 Pain in left shoulder: Secondary | ICD-10-CM | POA: Diagnosis not present

## 2022-04-28 DIAGNOSIS — M62838 Other muscle spasm: Secondary | ICD-10-CM | POA: Diagnosis not present

## 2022-04-28 DIAGNOSIS — M542 Cervicalgia: Secondary | ICD-10-CM | POA: Diagnosis not present
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
## 2022-04-29 DIAGNOSIS — R0781 Pleurodynia: Secondary | ICD-10-CM | POA: Diagnosis not present

## 2022-04-29 DIAGNOSIS — M9908 Segmental and somatic dysfunction of rib cage: Secondary | ICD-10-CM | POA: Diagnosis not present

## 2022-04-29 DIAGNOSIS — M542 Cervicalgia: Secondary | ICD-10-CM | POA: Diagnosis not present

## 2022-04-29 DIAGNOSIS — M26629 Arthralgia of temporomandibular joint, unspecified side: Secondary | ICD-10-CM | POA: Diagnosis not present

## 2022-04-29 DIAGNOSIS — M9900 Segmental and somatic dysfunction of head region: Secondary | ICD-10-CM | POA: Diagnosis not present

## 2022-04-29 DIAGNOSIS — M5382 Other specified dorsopathies, cervical region: Secondary | ICD-10-CM | POA: Diagnosis not present

## 2022-04-29 DIAGNOSIS — M9901 Segmental and somatic dysfunction of cervical region: Secondary | ICD-10-CM | POA: Diagnosis not present

## 2022-04-29 DIAGNOSIS — M9907 Segmental and somatic dysfunction of upper extremity: Secondary | ICD-10-CM | POA: Diagnosis not present

## 2022-05-05 ENCOUNTER — Ambulatory Visit: Payer: Medicare Other

## 2022-05-05 DIAGNOSIS — R279 Unspecified lack of coordination: Secondary | ICD-10-CM | POA: Diagnosis not present

## 2022-05-05 DIAGNOSIS — G8929 Other chronic pain: Secondary | ICD-10-CM | POA: Diagnosis not present

## 2022-05-05 DIAGNOSIS — M62838 Other muscle spasm: Secondary | ICD-10-CM

## 2022-05-05 DIAGNOSIS — R293 Abnormal posture: Secondary | ICD-10-CM | POA: Diagnosis not present

## 2022-05-05 DIAGNOSIS — M25512 Pain in left shoulder: Secondary | ICD-10-CM | POA: Diagnosis not present

## 2022-05-05 DIAGNOSIS — M6281 Muscle weakness (generalized): Secondary | ICD-10-CM

## 2022-05-05 DIAGNOSIS — M542 Cervicalgia: Secondary | ICD-10-CM | POA: Diagnosis not present
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
## 2022-05-07 ENCOUNTER — Ambulatory Visit: Payer: Medicare Other

## 2022-05-07 DIAGNOSIS — M62838 Other muscle spasm: Secondary | ICD-10-CM

## 2022-05-07 DIAGNOSIS — R279 Unspecified lack of coordination: Secondary | ICD-10-CM

## 2022-05-07 DIAGNOSIS — M25512 Pain in left shoulder: Secondary | ICD-10-CM | POA: Diagnosis not present

## 2022-05-07 DIAGNOSIS — M6281 Muscle weakness (generalized): Secondary | ICD-10-CM | POA: Diagnosis not present

## 2022-05-07 DIAGNOSIS — R293 Abnormal posture: Secondary | ICD-10-CM

## 2022-05-07 DIAGNOSIS — G8929 Other chronic pain: Secondary | ICD-10-CM | POA: Diagnosis not present

## 2022-05-07 DIAGNOSIS — M542 Cervicalgia: Secondary | ICD-10-CM | POA: Diagnosis not present
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
## 2022-05-07 NOTE — Patient Instructions (Signed)

## 2022-05-12 ENCOUNTER — Ambulatory Visit: Payer: Medicare Other

## 2022-05-12 DIAGNOSIS — G8929 Other chronic pain: Secondary | ICD-10-CM

## 2022-05-12 DIAGNOSIS — R293 Abnormal posture: Secondary | ICD-10-CM

## 2022-05-12 DIAGNOSIS — R279 Unspecified lack of coordination: Secondary | ICD-10-CM | POA: Diagnosis not present

## 2022-05-12 DIAGNOSIS — M62838 Other muscle spasm: Secondary | ICD-10-CM | POA: Diagnosis not present

## 2022-05-12 DIAGNOSIS — M542 Cervicalgia: Secondary | ICD-10-CM | POA: Diagnosis not present

## 2022-05-12 DIAGNOSIS — M6281 Muscle weakness (generalized): Secondary | ICD-10-CM | POA: Diagnosis not present

## 2022-05-12 DIAGNOSIS — M25512 Pain in left shoulder: Secondary | ICD-10-CM | POA: Diagnosis not present
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
## 2022-05-16 ENCOUNTER — Ambulatory Visit: Payer: Medicare Other | Admitting: Podiatry

## 2022-05-16 ENCOUNTER — Encounter: Payer: Self-pay | Admitting: Podiatry

## 2022-05-16 ENCOUNTER — Ambulatory Visit (INDEPENDENT_AMBULATORY_CARE_PROVIDER_SITE_OTHER): Payer: Medicare Other

## 2022-05-16 DIAGNOSIS — M7661 Achilles tendinitis, right leg: Secondary | ICD-10-CM

## 2022-05-16 DIAGNOSIS — M25571 Pain in right ankle and joints of right foot: Secondary | ICD-10-CM | POA: Diagnosis not present

## 2022-05-16 DIAGNOSIS — M722 Plantar fascial fibromatosis: Secondary | ICD-10-CM

## 2022-05-16 NOTE — Progress Notes (Signed)
  Subjective:  Patient ID: Diana Gross, female    DOB: [birth year],   MRN: [id number]  Chief Complaint  Patient presents with   Ankle Pain    Right ankle pain  patient states pain is mostly at her achillis tendonitis     67 y.o. female presents for new concern on the right ankle pain that started about a month ago. Relates doing well today but worsened after doing a lot of steps. Has been wearing the powersteps and left foot is doing well after injection.   . Denies any other pedal complaints. Denies n/v/f/c.   Past Medical History:  Diagnosis Date   Abnormal uterine bleeding    prior to hysterectomy   Allergy    Anemia    prior to hysterectomy   Anxiety    Arthritis    right hip   Basal cell carcinoma 2015   Right shin.    BCC (basal cell carcinoma of skin)    Bulging lumbar disc    L5   Cervical pain    Complex regional pain syndrome    Depression    Epidemic cervical myalgia    Family history of breast cancer    Family history of glaucoma 07/02/2015   Fibroid    reason for Hysterectomy   Genetic testing 01/08/2017   Common Cancers panel (47 genes) @ [facility] - No pathogenic mutation detected   Heart murmur    Grade I   History of abnormal cervical Pap smear 1989   --hx colpo/cryotherapy to cervix    History of cranial surgery    with "complications" which has caused chronic pain    History of laminectomy    Hx of migraines    Neuromuscular disorder (HCC)    fibromyalgia, myofasical pain   Occipital neuralgia    Tic [place]    TMJ (dislocation of temporomandibular joint)    Trigeminal neuralgia    Urinary incontinence    Vaginal prolapse 2016   cystocele and rectocele    Objective:  Physical Exam: Vascular: DP/PT pulses 2/4 bilateral. CFT <3 seconds. Normal hair growth on digits. No edema.  Skin. No lacerations or abrasions bilateral feet.  Musculoskeletal: MMT 5/5 bilateral lower extremities in DF, PF, Inversion and Eversion. Deceased ROM in  DF of ankle joint. Nontender to medial calcaneal tubercle on the left. No pain along achilles Pt or medial arch. No pain with calcaneal squeeze. Some mild tenderness to watershed area of achilles. No pain with PF or DF today. Tendon intact. No palapbale defect noted.  Neurological: Sensation intact to light touch.   Assessment:   1. Tendonitis, Achilles, right   2. Plantar fasciitis of left foot      Plan:  Patient was evaluated and treated and all questions answered. -Xrays reviewed -Discussed Achilles insertional tendonitis and treatment options with patient.  -Discussed stretching exercises. -Heel lifts provided and discussed proper shoewear.  -Discussed if no improvement will consider MRI/PT/EPAT/PRP injections.  -Patient to return to office as needed or sooner if condition worsens.     Lorenda Peck, DPM

## 2022-05-16 NOTE — Patient Instructions (Signed)

## 2022-05-19 DIAGNOSIS — M26622 Arthralgia of left temporomandibular joint: Secondary | ICD-10-CM | POA: Diagnosis not present

## 2022-05-19 DIAGNOSIS — G501 Atypical facial pain: Secondary | ICD-10-CM | POA: Diagnosis not present

## 2022-05-19 DIAGNOSIS — M26621 Arthralgia of right temporomandibular joint: Secondary | ICD-10-CM | POA: Diagnosis not present

## 2022-05-19 DIAGNOSIS — G894 Chronic pain syndrome: Secondary | ICD-10-CM | POA: Diagnosis not present

## 2022-05-20 ENCOUNTER — Ambulatory Visit: Payer: Medicare Other

## 2022-05-21 ENCOUNTER — Ambulatory Visit: Payer: Medicare Other

## 2022-05-21 DIAGNOSIS — G8929 Other chronic pain: Secondary | ICD-10-CM

## 2022-05-21 DIAGNOSIS — R279 Unspecified lack of coordination: Secondary | ICD-10-CM

## 2022-05-21 DIAGNOSIS — M6281 Muscle weakness (generalized): Secondary | ICD-10-CM

## 2022-05-21 DIAGNOSIS — M62838 Other muscle spasm: Secondary | ICD-10-CM | POA: Diagnosis not present

## 2022-05-21 DIAGNOSIS — M542 Cervicalgia: Secondary | ICD-10-CM | POA: Diagnosis not present

## 2022-05-21 DIAGNOSIS — R293 Abnormal posture: Secondary | ICD-10-CM

## 2022-05-21 DIAGNOSIS — M25512 Pain in left shoulder: Secondary | ICD-10-CM | POA: Diagnosis not present
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
## 2022-05-26 ENCOUNTER — Ambulatory Visit: Payer: Medicare Other

## 2022-06-02 ENCOUNTER — Ambulatory Visit: Payer: Medicare Other | Attending: Nurse Practitioner

## 2022-06-02 DIAGNOSIS — N816 Rectocele: Secondary | ICD-10-CM | POA: Insufficient documentation

## 2022-06-02 DIAGNOSIS — R293 Abnormal posture: Secondary | ICD-10-CM

## 2022-06-02 DIAGNOSIS — N393 Stress incontinence (female) (male): Secondary | ICD-10-CM | POA: Diagnosis not present

## 2022-06-02 DIAGNOSIS — M62838 Other muscle spasm: Secondary | ICD-10-CM | POA: Diagnosis not present

## 2022-06-02 DIAGNOSIS — N8110 Cystocele, unspecified: Secondary | ICD-10-CM | POA: Diagnosis not present

## 2022-06-02 DIAGNOSIS — M6281 Muscle weakness (generalized): Secondary | ICD-10-CM

## 2022-06-02 DIAGNOSIS — R279 Unspecified lack of coordination: Secondary | ICD-10-CM
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
## 2022-06-02 NOTE — Patient Instructions (Addendum)
Urge Incontinence  Ideal urination frequency is every 2-4 wakeful hours, which equates to 5-8 times within a 24-hour period.   Urge incontinence is leakage that occurs when the bladder muscle contracts, creating a sudden need to go before getting to the bathroom.   Going too often when your bladder isn't actually full can disrupt the body's automatic signals to store and hold urine longer, which will increase urgency/frequency.  In this case, the bladder "is running the show" and strategies can be learned to retrain this pattern.   One should be able to control the first urge to urinate, at around 182m.  The bladder can hold up to a "grande latte," or 4080m To help you gain control, practice the Urge Drill below when urgency strikes.  This drill will help retrain your bladder signals and allow you to store and hold urine longer.  The overall goal is to stretch out your time between voids to reach a more manageable voiding schedule.    Practice your "quick flicks" often throughout the day (each waking hour) even when you don't need feel the urge to go.  This will help strengthen your pelvic floor muscles, making them more effective in controlling leakage.  Urge Drill  When you feel an urge to go, follow these steps to regain control: Stop what you are doing and be still Take one deep breath, directing your air into your abdomen Think an affirming thought, such as "I've got this." Do 5 quick flicks of your pelvic floor Walk with control to the bathroom to void, or delay voiding   Squatty potty: When your knees are level or below the level of your hips, pelvic floor muscles are pressed against rectum, preventing ease of bowel movement. By getting knees above the level of the hips, these pelvic floor muscles relax, allowing easier passage of bowel movement.  Ways to get knees above hips: o Squatty Potty (7inch and 9inch versions) o Small stool o Roll of toilet paper under each foot o  Hardback book or stack of magazines under each foot  Relaxed Toileting mechanics: Once in this position, make sure to lean forward with forearms on thighs, wide knees, relaxed stomach, and breathe. (For you I'd try leaning back)   [facility]Al[facility]ucl[address] DriveSuManitowocrHokahNC [id number] [address] [id number] [phone]

## 2022-06-09 ENCOUNTER — Ambulatory Visit: Payer: Medicare Other

## 2022-06-10 DIAGNOSIS — M62838 Other muscle spasm: Secondary | ICD-10-CM | POA: Diagnosis not present

## 2022-06-10 DIAGNOSIS — M5481 Occipital neuralgia: Secondary | ICD-10-CM | POA: Diagnosis not present

## 2022-06-11 ENCOUNTER — Ambulatory Visit: Payer: Medicare Other

## 2022-06-25 DIAGNOSIS — G894 Chronic pain syndrome: Secondary | ICD-10-CM | POA: Diagnosis not present

## 2022-06-25 DIAGNOSIS — M26622 Arthralgia of left temporomandibular joint: Secondary | ICD-10-CM | POA: Diagnosis not present

## 2022-06-25 DIAGNOSIS — M26621 Arthralgia of right temporomandibular joint: Secondary | ICD-10-CM | POA: Diagnosis not present

## 2022-06-25 DIAGNOSIS — G501 Atypical facial pain: Secondary | ICD-10-CM | POA: Diagnosis not present

## 2022-07-15 DIAGNOSIS — M9900 Segmental and somatic dysfunction of head region: Secondary | ICD-10-CM | POA: Diagnosis not present

## 2022-07-15 DIAGNOSIS — M9907 Segmental and somatic dysfunction of upper extremity: Secondary | ICD-10-CM | POA: Diagnosis not present

## 2022-07-15 DIAGNOSIS — R0781 Pleurodynia: Secondary | ICD-10-CM | POA: Diagnosis not present

## 2022-07-15 DIAGNOSIS — M26629 Arthralgia of temporomandibular joint, unspecified side: Secondary | ICD-10-CM | POA: Diagnosis not present

## 2022-07-15 DIAGNOSIS — M9901 Segmental and somatic dysfunction of cervical region: Secondary | ICD-10-CM | POA: Diagnosis not present

## 2022-07-15 DIAGNOSIS — M5382 Other specified dorsopathies, cervical region: Secondary | ICD-10-CM | POA: Diagnosis not present

## 2022-07-15 DIAGNOSIS — M542 Cervicalgia: Secondary | ICD-10-CM | POA: Diagnosis not present

## 2022-07-15 DIAGNOSIS — M9908 Segmental and somatic dysfunction of rib cage: Secondary | ICD-10-CM | POA: Diagnosis not present

## 2022-07-29 DIAGNOSIS — M5382 Other specified dorsopathies, cervical region: Secondary | ICD-10-CM | POA: Diagnosis not present

## 2022-07-29 DIAGNOSIS — M26629 Arthralgia of temporomandibular joint, unspecified side: Secondary | ICD-10-CM | POA: Diagnosis not present

## 2022-07-29 DIAGNOSIS — R0781 Pleurodynia: Secondary | ICD-10-CM | POA: Diagnosis not present

## 2022-07-29 DIAGNOSIS — M542 Cervicalgia: Secondary | ICD-10-CM | POA: Diagnosis not present

## 2022-07-29 DIAGNOSIS — M9900 Segmental and somatic dysfunction of head region: Secondary | ICD-10-CM | POA: Diagnosis not present

## 2022-07-29 DIAGNOSIS — M9901 Segmental and somatic dysfunction of cervical region: Secondary | ICD-10-CM | POA: Diagnosis not present

## 2022-07-29 DIAGNOSIS — M9907 Segmental and somatic dysfunction of upper extremity: Secondary | ICD-10-CM | POA: Diagnosis not present

## 2022-07-29 DIAGNOSIS — M9908 Segmental and somatic dysfunction of rib cage: Secondary | ICD-10-CM | POA: Diagnosis not present

## 2022-07-31 DIAGNOSIS — G500 Trigeminal neuralgia: Secondary | ICD-10-CM | POA: Diagnosis not present

## 2022-07-31 DIAGNOSIS — G501 Atypical facial pain: Secondary | ICD-10-CM | POA: Diagnosis not present

## 2022-08-27 DIAGNOSIS — G501 Atypical facial pain: Secondary | ICD-10-CM | POA: Diagnosis not present

## 2022-08-27 DIAGNOSIS — M26622 Arthralgia of left temporomandibular joint: Secondary | ICD-10-CM | POA: Diagnosis not present

## 2022-08-27 DIAGNOSIS — M26621 Arthralgia of right temporomandibular joint: Secondary | ICD-10-CM | POA: Diagnosis not present

## 2022-08-27 DIAGNOSIS — G894 Chronic pain syndrome: Secondary | ICD-10-CM | POA: Diagnosis not present

## 2022-09-02 DIAGNOSIS — M26629 Arthralgia of temporomandibular joint, unspecified side: Secondary | ICD-10-CM | POA: Diagnosis not present

## 2022-09-02 DIAGNOSIS — M5382 Other specified dorsopathies, cervical region: Secondary | ICD-10-CM | POA: Diagnosis not present

## 2022-09-02 DIAGNOSIS — M542 Cervicalgia: Secondary | ICD-10-CM | POA: Diagnosis not present

## 2022-09-02 DIAGNOSIS — R0781 Pleurodynia: Secondary | ICD-10-CM | POA: Diagnosis not present

## 2022-09-07 DIAGNOSIS — M9907 Segmental and somatic dysfunction of upper extremity: Secondary | ICD-10-CM | POA: Diagnosis not present

## 2022-09-07 DIAGNOSIS — M9900 Segmental and somatic dysfunction of head region: Secondary | ICD-10-CM | POA: Diagnosis not present

## 2022-09-07 DIAGNOSIS — M9901 Segmental and somatic dysfunction of cervical region: Secondary | ICD-10-CM | POA: Diagnosis not present

## 2022-09-07 DIAGNOSIS — M9908 Segmental and somatic dysfunction of rib cage: Secondary | ICD-10-CM | POA: Diagnosis not present

## 2022-10-01 DIAGNOSIS — K080 Exfoliation of teeth due to systemic causes: Secondary | ICD-10-CM | POA: Diagnosis not present

## 2022-10-07 DIAGNOSIS — M9907 Segmental and somatic dysfunction of upper extremity: Secondary | ICD-10-CM | POA: Diagnosis not present

## 2022-10-07 DIAGNOSIS — M542 Cervicalgia: Secondary | ICD-10-CM | POA: Diagnosis not present

## 2022-10-07 DIAGNOSIS — M5382 Other specified dorsopathies, cervical region: Secondary | ICD-10-CM | POA: Diagnosis not present

## 2022-10-07 DIAGNOSIS — I10 Essential (primary) hypertension: Secondary | ICD-10-CM | POA: Diagnosis not present

## 2022-10-07 DIAGNOSIS — M9901 Segmental and somatic dysfunction of cervical region: Secondary | ICD-10-CM | POA: Diagnosis not present

## 2022-10-07 DIAGNOSIS — M9900 Segmental and somatic dysfunction of head region: Secondary | ICD-10-CM | POA: Diagnosis not present

## 2022-10-07 DIAGNOSIS — M26629 Arthralgia of temporomandibular joint, unspecified side: Secondary | ICD-10-CM | POA: Diagnosis not present

## 2022-10-07 DIAGNOSIS — R0781 Pleurodynia: Secondary | ICD-10-CM | POA: Diagnosis not present

## 2022-10-07 DIAGNOSIS — M9908 Segmental and somatic dysfunction of rib cage: Secondary | ICD-10-CM | POA: Diagnosis not present

## 2022-10-08 DIAGNOSIS — K080 Exfoliation of teeth due to systemic causes: Secondary | ICD-10-CM | POA: Diagnosis not present

## 2022-10-08 LAB — COMPREHENSIVE METABOLIC PANEL
Albumin: 4.4 (ref 3.5–5.0)
Calcium: 9 (ref 8.7–10.7)
EGFR: 71
Globulin: 2.8

## 2022-10-08 LAB — CBC AND DIFFERENTIAL
HCT: 46 (ref 36–46)
Hemoglobin: 14.9 (ref 12.0–16.0)
Neutrophils Absolute: 3.6
Platelets: 210 10*3/uL (ref 150–400)
WBC: 6.3

## 2022-10-08 LAB — LIPID PANEL
Cholesterol: 215 — AB (ref 0–200)
HDL: 48 (ref 35–70)
LDL Cholesterol: 119
LDl/HDL Ratio: 2.5
Triglycerides: 242 — AB (ref 40–160)

## 2022-10-08 LAB — MICROALBUMIN / CREATININE URINE RATIO: Microalb Creat Ratio: 2

## 2022-10-08 LAB — CBC: RBC: 5.06 (ref 3.87–5.11)

## 2022-10-08 LAB — BASIC METABOLIC PANEL
BUN: 13 (ref 4–21)
CO2: 27 — AB (ref 13–22)
Chloride: 105 (ref 99–108)
Creatinine: 0.9 (ref 0.5–1.1)
Glucose: 89
Potassium: 4 meq/L (ref 3.5–5.1)
Sodium: 137 (ref 137–147)

## 2022-10-08 LAB — HEPATIC FUNCTION PANEL
ALT: 11 U/L (ref 7–35)
AST: 16 (ref 13–35)
Alkaline Phosphatase: 84 (ref 25–125)
Bilirubin, Total: 0.4

## 2022-10-08 LAB — MICROALBUMIN, URINE: Microalb, Ur: 0.4

## 2022-10-08 LAB — PROTEIN / CREATININE RATIO, URINE: Creatinine, Urine: 191

## 2022-10-20 DIAGNOSIS — Z5181 Encounter for therapeutic drug level monitoring: Secondary | ICD-10-CM | POA: Diagnosis not present

## 2022-10-20 DIAGNOSIS — M5481 Occipital neuralgia: Secondary | ICD-10-CM | POA: Diagnosis not present

## 2022-10-20 DIAGNOSIS — Z79899 Other long term (current) drug therapy: Secondary | ICD-10-CM | POA: Diagnosis not present

## 2022-10-20 DIAGNOSIS — G894 Chronic pain syndrome: Secondary | ICD-10-CM | POA: Diagnosis not present

## 2022-10-22 ENCOUNTER — Encounter (INDEPENDENT_AMBULATORY_CARE_PROVIDER_SITE_OTHER): Payer: Self-pay

## 2022-11-11 DIAGNOSIS — M9908 Segmental and somatic dysfunction of rib cage: Secondary | ICD-10-CM | POA: Diagnosis not present

## 2022-11-11 DIAGNOSIS — M9900 Segmental and somatic dysfunction of head region: Secondary | ICD-10-CM | POA: Diagnosis not present

## 2022-11-11 DIAGNOSIS — M9907 Segmental and somatic dysfunction of upper extremity: Secondary | ICD-10-CM | POA: Diagnosis not present

## 2022-11-11 DIAGNOSIS — M26629 Arthralgia of temporomandibular joint, unspecified side: Secondary | ICD-10-CM | POA: Diagnosis not present

## 2022-11-11 DIAGNOSIS — M542 Cervicalgia: Secondary | ICD-10-CM | POA: Diagnosis not present

## 2022-11-11 DIAGNOSIS — R0781 Pleurodynia: Secondary | ICD-10-CM | POA: Diagnosis not present

## 2022-11-11 DIAGNOSIS — M5382 Other specified dorsopathies, cervical region: Secondary | ICD-10-CM | POA: Diagnosis not present

## 2022-11-11 DIAGNOSIS — M9901 Segmental and somatic dysfunction of cervical region: Secondary | ICD-10-CM | POA: Diagnosis not present

## 2022-11-13 DIAGNOSIS — K080 Exfoliation of teeth due to systemic causes: Secondary | ICD-10-CM | POA: Diagnosis not present

## 2022-11-14 DIAGNOSIS — Z1231 Encounter for screening mammogram for malignant neoplasm of breast: Secondary | ICD-10-CM | POA: Diagnosis not present

## 2022-11-14 LAB — HM MAMMOGRAPHY

## 2022-11-21 ENCOUNTER — Encounter: Payer: Medicare Other | Admitting: Family Medicine

## 2022-12-01 ENCOUNTER — Telehealth: Payer: Self-pay

## 2022-12-01 ENCOUNTER — Telehealth: Payer: Self-pay | Admitting: Family Medicine

## 2022-12-01 NOTE — Telephone Encounter (Signed)
Received Mammogram results from [facility] . Will place on PCP desk for review.

## 2022-12-01 NOTE — Telephone Encounter (Signed)
Please inform patient her mammogram completed August 23rd 2024 is normal.

## 2022-12-01 NOTE — Telephone Encounter (Signed)
LM for pt to return call to discuss.  

## 2022-12-09 DIAGNOSIS — G894 Chronic pain syndrome: Secondary | ICD-10-CM | POA: Diagnosis not present

## 2022-12-09 DIAGNOSIS — M26622 Arthralgia of left temporomandibular joint: Secondary | ICD-10-CM | POA: Diagnosis not present

## 2022-12-09 DIAGNOSIS — M26621 Arthralgia of right temporomandibular joint: Secondary | ICD-10-CM | POA: Diagnosis not present

## 2022-12-09 DIAGNOSIS — M5481 Occipital neuralgia: Secondary | ICD-10-CM | POA: Diagnosis not present

## 2022-12-16 DIAGNOSIS — R0781 Pleurodynia: Secondary | ICD-10-CM | POA: Diagnosis not present

## 2022-12-16 DIAGNOSIS — M9907 Segmental and somatic dysfunction of upper extremity: Secondary | ICD-10-CM | POA: Diagnosis not present

## 2022-12-16 DIAGNOSIS — M26629 Arthralgia of temporomandibular joint, unspecified side: Secondary | ICD-10-CM | POA: Diagnosis not present

## 2022-12-16 DIAGNOSIS — M9900 Segmental and somatic dysfunction of head region: Secondary | ICD-10-CM | POA: Diagnosis not present

## 2022-12-16 DIAGNOSIS — M5382 Other specified dorsopathies, cervical region: Secondary | ICD-10-CM | POA: Diagnosis not present

## 2022-12-16 DIAGNOSIS — M542 Cervicalgia: Secondary | ICD-10-CM | POA: Diagnosis not present

## 2022-12-16 DIAGNOSIS — M9908 Segmental and somatic dysfunction of rib cage: Secondary | ICD-10-CM | POA: Diagnosis not present

## 2022-12-16 DIAGNOSIS — M9901 Segmental and somatic dysfunction of cervical region: Secondary | ICD-10-CM | POA: Diagnosis not present

## 2022-12-22 ENCOUNTER — Encounter: Payer: Self-pay | Admitting: Family Medicine

## 2022-12-22 DIAGNOSIS — G44209 Tension-type headache, unspecified, not intractable: Secondary | ICD-10-CM | POA: Diagnosis not present

## 2023-01-13 DIAGNOSIS — G501 Atypical facial pain: Secondary | ICD-10-CM | POA: Diagnosis not present

## 2023-01-13 DIAGNOSIS — G500 Trigeminal neuralgia: Secondary | ICD-10-CM | POA: Diagnosis not present

## 2023-01-14 ENCOUNTER — Ambulatory Visit: Payer: Medicare Other

## 2023-01-14 VITALS — Wt 218.0 lb

## 2023-01-14 DIAGNOSIS — Z0000 Encounter for general adult medical examination without abnormal findings: Secondary | ICD-10-CM | POA: Diagnosis not present
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
## 2023-01-14 NOTE — Patient Instructions (Signed)
Diana Gross , Thank you for taking time to come for your Medicare Wellness Visit. I appreciate your ongoing commitment to your health goals. Please review the following plan we discussed and let me know if I can assist you in the future.   Referrals/Orders/Follow-Ups/Clinician Recommendations: Each day, aim for 6 glasses of water, plenty of protein in your diet and try to get up and walk/ stretch every hour for 5-10 minutes at a time.  Aim for 30 minutes of exercise or brisk walking, 6-8 glasses of water, and 5 servings of fruits and vegetables each day.   This is a list of the screening recommended for you and due dates:  Health Maintenance  Topic Date Due   Zoster (Shingles) Vaccine (1 of 2) Never done   Flu Shot  10/23/2022   Mammogram  11/14/2023   Medicare Annual Wellness Visit  01/14/2024   Cologuard (Stool DNA test)  01/18/2024   DTaP/Tdap/Td vaccine (2 - Td or Tdap) 01/28/2024   DEXA scan (bone density measurement)  03/10/2032   Pneumonia Vaccine  Completed   Hepatitis C Screening  Completed   HPV Vaccine  Aged Out   Colon Cancer Screening  Discontinued   COVID-19 Vaccine  Discontinued    Advanced directives: (Copy Requested) Please bring a copy of your health care power of attorney and living will to the office to be added to your chart at your convenience.  Next Medicare Annual Wellness Visit scheduled for next year: Yes

## 2023-01-20 DIAGNOSIS — M9901 Segmental and somatic dysfunction of cervical region: Secondary | ICD-10-CM | POA: Diagnosis not present

## 2023-01-20 DIAGNOSIS — R0781 Pleurodynia: Secondary | ICD-10-CM | POA: Diagnosis not present

## 2023-01-20 DIAGNOSIS — M9907 Segmental and somatic dysfunction of upper extremity: Secondary | ICD-10-CM | POA: Diagnosis not present

## 2023-01-20 DIAGNOSIS — M9908 Segmental and somatic dysfunction of rib cage: Secondary | ICD-10-CM | POA: Diagnosis not present

## 2023-01-20 DIAGNOSIS — M26629 Arthralgia of temporomandibular joint, unspecified side: Secondary | ICD-10-CM | POA: Diagnosis not present

## 2023-01-20 DIAGNOSIS — M5382 Other specified dorsopathies, cervical region: Secondary | ICD-10-CM | POA: Diagnosis not present

## 2023-01-20 DIAGNOSIS — M9900 Segmental and somatic dysfunction of head region: Secondary | ICD-10-CM | POA: Diagnosis not present

## 2023-01-20 DIAGNOSIS — M542 Cervicalgia: Secondary | ICD-10-CM | POA: Diagnosis not present

## 2023-01-21 ENCOUNTER — Ambulatory Visit: Payer: Medicare Other | Admitting: Family Medicine

## 2023-01-21 ENCOUNTER — Encounter: Payer: Self-pay | Admitting: Family Medicine

## 2023-01-21 VITALS — BP 128/70 | HR 75 | Temp 98.1°F | Ht 68.25 in | Wt 215.4 lb

## 2023-01-21 DIAGNOSIS — Z0000 Encounter for general adult medical examination without abnormal findings: Secondary | ICD-10-CM

## 2023-01-21 DIAGNOSIS — Z1211 Encounter for screening for malignant neoplasm of colon: Secondary | ICD-10-CM

## 2023-01-21 DIAGNOSIS — Z13220 Encounter for screening for lipoid disorders: Secondary | ICD-10-CM

## 2023-01-21 DIAGNOSIS — Z131 Encounter for screening for diabetes mellitus: Secondary | ICD-10-CM | POA: Diagnosis not present

## 2023-01-21 DIAGNOSIS — Z23 Encounter for immunization: Secondary | ICD-10-CM | POA: Diagnosis not present

## 2023-01-21 DIAGNOSIS — M8580 Other specified disorders of bone density and structure, unspecified site: Secondary | ICD-10-CM

## 2023-01-21 LAB — HEMOGLOBIN A1C: Hgb A1c MFr Bld: 5.7 % (ref 4.6–6.5)

## 2023-01-21 LAB — TSH: TSH: 2.3 u[IU]/mL (ref 0.35–5.50)
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
## 2023-01-21 NOTE — Patient Instructions (Addendum)
Return in about 1 year (around 01/22/2024) for cpe (20 min).        Great to see you today.  I have refilled the medication(s) we provide.   If labs were collected or images ordered, we will inform you of  results once we have received them and reviewed. We will contact you either by echart message, or telephone call.  Please give ample time to the testing facility, and our office to run,  receive and review results. Please do not call inquiring of results, even if you can see them in your chart. We will contact you as soon as we are able. If it has been over 1 week since the test was completed, and you have not yet heard from [place], then please call us.    - echart message- for normal results that have been seen by the patient already.   - telephone call: abnormal results or if patient has not viewed results in their echart.  If a referral to a specialist was entered for you, please call us in 2 weeks if you have not heard from the specialist office to schedule.

## 2023-02-09 DIAGNOSIS — M62838 Other muscle spasm: Secondary | ICD-10-CM | POA: Diagnosis not present

## 2023-02-09 DIAGNOSIS — M26622 Arthralgia of left temporomandibular joint: Secondary | ICD-10-CM | POA: Diagnosis not present

## 2023-02-09 DIAGNOSIS — M26621 Arthralgia of right temporomandibular joint: Secondary | ICD-10-CM | POA: Diagnosis not present

## 2023-02-09 DIAGNOSIS — G894 Chronic pain syndrome: Secondary | ICD-10-CM | POA: Diagnosis not present

## 2023-02-10 DIAGNOSIS — M9900 Segmental and somatic dysfunction of head region: Secondary | ICD-10-CM | POA: Diagnosis not present

## 2023-02-10 DIAGNOSIS — M5382 Other specified dorsopathies, cervical region: Secondary | ICD-10-CM | POA: Diagnosis not present

## 2023-02-10 DIAGNOSIS — M9907 Segmental and somatic dysfunction of upper extremity: Secondary | ICD-10-CM | POA: Diagnosis not present

## 2023-02-10 DIAGNOSIS — M26629 Arthralgia of temporomandibular joint, unspecified side: Secondary | ICD-10-CM | POA: Diagnosis not present

## 2023-02-10 DIAGNOSIS — M9908 Segmental and somatic dysfunction of rib cage: Secondary | ICD-10-CM | POA: Diagnosis not present

## 2023-02-10 DIAGNOSIS — R0781 Pleurodynia: Secondary | ICD-10-CM | POA: Diagnosis not present

## 2023-02-10 DIAGNOSIS — M9901 Segmental and somatic dysfunction of cervical region: Secondary | ICD-10-CM | POA: Diagnosis not present

## 2023-02-10 DIAGNOSIS — M542 Cervicalgia: Secondary | ICD-10-CM | POA: Diagnosis not present

## 2023-02-20 ENCOUNTER — Telehealth: Payer: Medicare Other | Admitting: Family Medicine

## 2023-02-20 DIAGNOSIS — R059 Cough, unspecified: Secondary | ICD-10-CM | POA: Diagnosis not present

## 2023-02-20 DIAGNOSIS — J019 Acute sinusitis, unspecified: Secondary | ICD-10-CM

## 2023-02-20 MED ORDER — AZITHROMYCIN 250 MG PO TABS: ORAL_TABLET | ORAL | 0 refills | Status: AC

## 2023-02-20 MED ORDER — BENZONATATE 100 MG PO CAPS: 100.0000 mg | ORAL_CAPSULE | Freq: Three times a day (TID) | ORAL | 0 refills | Status: AC | PRN

## 2023-02-20 NOTE — Progress Notes (Signed)
Virtual Visit Consent   [name], you are scheduled for a virtual visit with a [facility] provider today. Just as with appointments in the office, your consent must be obtained to participate. Your consent will be active for this visit and any virtual visit you may have with one of our providers in the next 365 days. If you have a MyChart account, a copy of this consent can be sent to you electronically.  As this is a virtual visit, video technology does not allow for your provider to perform a traditional examination. This may limit your provider's ability to fully assess your condition. If your provider identifies any concerns that need to be evaluated in person or the need to arrange testing (such as labs, EKG, etc.), we will make arrangements to do so. Although advances in technology are sophisticated, we cannot ensure that it will always work on either your end or our end. If the connection with a video visit is poor, the visit may have to be switched to a telephone visit. With either a video or telephone visit, we are not always able to ensure that we have a secure connection.  By engaging in this virtual visit, you consent to the provision of healthcare and authorize for your insurance to be billed (if applicable) for the services provided during this visit. Depending on your insurance coverage, you may receive a charge related to this service.  I need to obtain your verbal consent now. Are you willing to proceed with your visit today? Diana Gross has provided verbal consent on 02/20/2023 for a virtual visit (video or telephone). Reed Pandy, [place]  Date: 02/20/2023 3:29 PM  Virtual Visit via Video Note   I, Reed Pandy, connected with  Diana Gross  ([id number], 1955-05-14) on 02/20/23 at  3:30 PM EST by a video-enabled telemedicine application and verified that I am speaking with the correct person using two identifiers.  Location: Patient: Virtual  Visit Location Patient: Home Provider: Virtual Visit Location Provider: Home Office   I discussed the limitations of evaluation and management by telemedicine and the availability of in person appointments. The patient expressed understanding and agreed to proceed.    History of Present Illness: Diana Gross is a 67 y.o. who identifies as a female who was assigned female at birth, and is being seen today for c/o having a cold for ten days.  Pt states the last couple of days feels dizzy when leaning to pick up something.  Pt states she has trigeminal neuralgia and has a difficulty knowing if she has a sinus infection.  Pt states she is coughing up mucus.  Then turns into a nonproductive cough, manly at night. Pt states he ears feels stuffed up. Pt states having some pain above eye. Pt states using flonase.   HPI: HPI  Problems:  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 12/23/2018   Nonallopathic lesion of cervical region 06/12/2015   Nonallopathic lesion of thoracic region 06/12/2015   Nonallopathic lesion of lumbosacral region 06/12/2015   Somatic dysfunction 05/01/2015   Hormone replacement therapy 05/01/2015   Chronic pain syndrome 05/01/2015   Fibromyalgia 05/01/2015   Chronic narcotic use 05/01/2015  Occipital neuralgia 05/01/2015   Trigeminal neuralgia 05/01/2015    Allergies:  Allergies  Allergen Reactions   Amitriptyline Rash    Other Reaction: involuntary movements   Baclofen Diarrhea and Other (See Comments)    Other Reaction: rigid muscle, cramping   Bupropion Other (See Comments)    Other Reaction: insominia, severe agitation   Clonazepam Itching    Duloxetine Rash    Other Reaction: high blood sugar, skin rash   Fluoxetine Anxiety    Other Reaction: insomnia, severe agitation   Gabapentin Other (See Comments)    Other Reaction: throat closing up, hallucinates   Oxcarbazepine Rash   Paroxetine Hcl Anxiety    Other Reaction: insomnia, severe agitation   Penicillins Nausea And Vomiting   Tapentadol Rash   Topiramate     Other reaction(s): Other (See Comments) Other Reaction: inconttinence/urine   Tramadol Anxiety    Other Reaction: heart racing/ panic attacks   Carbamazepine Rash   Carisoprodol Nausea Only   Ibuprofen Rash   Metaxalone Other (See Comments)    Other Reaction: Other reaction   Morphine Rash   Naproxen Sodium Rash   Oxymorphone Rash   Amoxicillin Diarrhea and Nausea Only   Aspirin    Augmentin [Amoxicillin-Pot Clavulanate] Nausea Only   Decadron  [Dexamethasone]     Other reaction(s): Unknown Uncoded Allergy. Allergen: DECADRON   Fentanyl Other (See Comments)    Suicidal ideation   Keflex [Cephalexin]     rash   Pregabalin Swelling    Other Reaction: swelling of hands/feet   Prozac [Fluoxetine Hcl] Other (See Comments)    insomnia   Ativan [Lorazepam] Anxiety   Celebrex [Celecoxib] Rash   Cymbalta [Duloxetine Hcl] Rash    Elevated blood sugar   Flexeril [Cyclobenzaprine] Rash and Itching   Nortriptyline Rash   Opana [Oxymorphone Hcl] Rash   Valium [Diazepam] Rash   Vimpat [Lacosamide] Rash   Zorvolex [Diclofenac] Anxiety   Medications:  Current Outpatient Medications:    azithromycin (ZITHROMAX) 250 MG tablet, Take 2 tablets on day 1, then 1 tablet daily on days 2 through 5, Disp: 6 tablet, Rfl: 0   benzonatate (TESSALON) 100 MG capsule, Take 1 capsule (100 mg total) by mouth 3 (three) times daily as needed for up to 7 days., Disp: 21 capsule, Rfl: 0   ALPRAZolam (XANAX) 1 MG tablet, Takes 1/2 to 1 tablet up to 3 times a day, Disp: , Rfl: 0   chlorthalidone (HYGROTON) 25 MG tablet, Take 25 mg by  mouth as needed (per pt)., Disp: , Rfl:    Cholecalciferol 50 MCG (2000 UT) CAPS, Take 2,000 Units by mouth daily., Disp: , Rfl:    diphenhydrAMINE (BENADRYL) 25 MG tablet, Take 25 mg by mouth as needed., Disp: , Rfl:    docusate sodium (COLACE) 100 MG capsule, Take 100 mg by mouth 2 (two) times daily., Disp: , Rfl:    estradiol (CLIMARA - DOSED IN MG/24 HR) 0.05 mg/24hr patch, Place 1 patch (0.05 mg total) onto the skin once a week., Disp: 12 patch, Rfl: 3   estradiol (ESTRACE) 0.1 MG/GM vaginal cream, INSERT ONE HALF GRAM (1 APPLICATORFUL) VAGINALLY TWICE A WEEK, Disp: 42.5 g, Rfl: 2   famotidine (PEPCID) 10 MG tablet, Take 10 mg by mouth as needed., Disp: , Rfl:    fexofenadine (ALLEGRA) 180 MG tablet, Take 180 mg by mouth as needed. , Disp: , Rfl:    fluticasone (FLONASE) 50 MCG/ACT nasal spray, Place into both nostrils as needed. ,  Disp: , Rfl:    magnesium 30 MG tablet, Take by mouth. , Disp: , Rfl:    NARCAN 4 MG/0.1ML LIQD nasal spray kit, , Disp: , Rfl:    ondansetron (ZOFRAN-ODT) 4 MG disintegrating tablet, Take 4 mg by mouth every 4 (four) hours as needed for nausea or vomiting. , Disp: , Rfl:    oxyCODONE (ROXICODONE) 15 MG immediate release tablet, Take 1 tablet by mouth every 4 (four) hours., Disp: , Rfl: 0   OXYCONTIN 15 MG 12 hr tablet, Take 45 mg by mouth. One every 8 hours  15 mg, Disp: , Rfl: 0   SUMAtriptan (IMITREX) 50 MG tablet, as needed. , Disp: , Rfl: 0   tiZANidine (ZANAFLEX) 4 MG tablet, Takes 1/2 when needed, Disp: , Rfl:   Current Facility-Administered Medications:    cyanocobalamin (VITAMIN B12) injection 1,000 mcg, 1,000 mcg, Intramuscular, Q30 days, Kuneff, Renee A, DO  Observations/Objective: Patient is well-developed, well-nourished in no acute distress.  Resting comfortably at home.  Head is normocephalic, atraumatic.  No labored breathing.  Speech is clear and coherent with logical content.  Patient is alert and oriented at baseline.    Assessment and  Plan: 1. Cough in adult - benzonatate (TESSALON) 100 MG capsule; Take 1 capsule (100 mg total) by mouth 3 (three) times daily as needed for up to 7 days.  Dispense: 21 capsule; Refill: 0  2. Acute sinusitis, recurrence not specified, unspecified location - azithromycin (ZITHROMAX) 250 MG tablet; Take 2 tablets on day 1, then 1 tablet daily on days 2 through 5  Dispense: 6 tablet; Refill: 0  -Advised Pt to follow up with PCP or with urgent care for worsening symptoms  Follow Up Instructions: I discussed the assessment and treatment plan with the patient. The patient was provided an opportunity to ask questions and all were answered. The patient agreed with the plan and demonstrated an understanding of the instructions.  A copy of instructions were sent to the patient via MyChart unless otherwise noted below.     The patient was advised to call back or seek an in-person evaluation if the symptoms worsen or if the condition fails to improve as anticipated.    Reed Pandy, PA-C

## 2023-02-20 NOTE — Patient Instructions (Signed)
Diana Gross, thank you for joining Reed Pandy, PA-C for today's virtual visit.  While this provider is not your primary care provider (PCP), if your PCP is located in our provider database this encounter information will be shared with them immediately following your visit.   A [facility] MyChart account gives you access to today's visit and all your visits, tests, and labs performed at [facility] " click here if you don't have a  MyChart account or go to [email or web address]  Consent: (Patient) Diana Gross provided verbal consent for this virtual visit at the beginning of the encounter.  Current Medications:  Current Outpatient Medications:    azithromycin (ZITHROMAX) 250 MG tablet, Take 2 tablets on day 1, then 1 tablet daily on days 2 through 5, Disp: 6 tablet, Rfl: 0   benzonatate (TESSALON) 100 MG capsule, Take 1 capsule (100 mg total) by mouth 3 (three) times daily as needed for up to 7 days., Disp: 21 capsule, Rfl: 0   ALPRAZolam (XANAX) 1 MG tablet, Takes 1/2 to 1 tablet up to 3 times a day, Disp: , Rfl: 0   chlorthalidone (HYGROTON) 25 MG tablet, Take 25 mg by mouth as needed (per pt)., Disp: , Rfl:    Cholecalciferol 50 MCG (2000 UT) CAPS, Take 2,000 Units by mouth daily., Disp: , Rfl:    diphenhydrAMINE (BENADRYL) 25 MG tablet, Take 25 mg by mouth as needed., Disp: , Rfl:    docusate sodium (COLACE) 100 MG capsule, Take 100 mg by mouth 2 (two) times daily., Disp: , Rfl:    estradiol (CLIMARA - DOSED IN MG/24 HR) 0.05 mg/24hr patch, Place 1 patch (0.05 mg total) onto the skin once a week., Disp: 12 patch, Rfl: 3   estradiol (ESTRACE) 0.1 MG/GM vaginal cream, INSERT ONE HALF GRAM (1 APPLICATORFUL) VAGINALLY TWICE A WEEK, Disp: 42.5 g, Rfl: 2   famotidine (PEPCID) 10 MG tablet, Take 10 mg by mouth as needed., Disp: , Rfl:    fexofenadine (ALLEGRA) 180 MG tablet, Take 180 mg by mouth as needed. , Disp: , Rfl:    fluticasone  (FLONASE) 50 MCG/ACT nasal spray, Place into both nostrils as needed. , Disp: , Rfl:    magnesium 30 MG tablet, Take by mouth. , Disp: , Rfl:    NARCAN 4 MG/0.1ML LIQD nasal spray kit, , Disp: , Rfl:    ondansetron (ZOFRAN-ODT) 4 MG disintegrating tablet, Take 4 mg by mouth every 4 (four) hours as needed for nausea or vomiting. , Disp: , Rfl:    oxyCODONE (ROXICODONE) 15 MG immediate release tablet, Take 1 tablet by mouth every 4 (four) hours., Disp: , Rfl: 0   OXYCONTIN 15 MG 12 hr tablet, Take 45 mg by mouth. One every 8 hours  15 mg, Disp: , Rfl: 0   SUMAtriptan (IMITREX) 50 MG tablet, as needed. , Disp: , Rfl: 0   tiZANidine (ZANAFLEX) 4 MG tablet, Takes 1/2 when needed, Disp: , Rfl:   Current Facility-Administered Medications:    cyanocobalamin (VITAMIN B12) injection 1,000 mcg, 1,000 mcg, Intramuscular, Q30 days, Kuneff, Renee A, DO   Medications ordered in this encounter:  Meds ordered this encounter  Medications   benzonatate (TESSALON) 100 MG capsule    Sig: Take 1 capsule (100 mg total) by mouth 3 (three) times daily as needed for up to 7 days.    Dispense:  21 capsule    Refill:  0   azithromycin (ZITHROMAX) 250 MG tablet    Sig:  Take 2 tablets on day 1, then 1 tablet daily on days 2 through 5    Dispense:  6 tablet    Refill:  0     *If you need refills on other medications prior to your next appointment, please contact your pharmacy*  Follow-Up: Call back or seek an in-person evaluation if the symptoms worsen or if the condition fails to improve as anticipated.  [facility] [phone]  Other Instructions Sinus Infection, Adult A sinus infection, also called sinusitis, is inflammation of your sinuses. Sinuses are hollow spaces in the bones around your face. Your sinuses are located: Around your eyes. In the middle of your forehead. Behind your nose. In your cheekbones. Mucus normally drains out of your sinuses. When your nasal tissues become  inflamed or swollen, mucus can become trapped or blocked. This allows bacteria, viruses, and fungi to grow, which leads to infection. Most infections of the sinuses are caused by a virus. A sinus infection can develop quickly. It can last for up to 4 weeks (acute) or for more than 12 weeks (chronic). A sinus infection often develops after a cold. What are the causes? This condition is caused by anything that creates swelling in the sinuses or stops mucus from draining. This includes: Allergies. Asthma. Infection from bacteria or viruses. Deformities or blockages in your nose or sinuses. Abnormal growths in the nose (nasal polyps). Pollutants, such as chemicals or irritants in the air. Infection from fungi. This is rare. What increases the risk? You are more likely to develop this condition if you: Have a weak body defense system (immune system). Do a lot of swimming or diving. Overuse nasal sprays. Smoke. What are the signs or symptoms? The main symptoms of this condition are pain and a feeling of pressure around the affected sinuses. Other symptoms include: Stuffy nose or congestion that makes it difficult to breathe through your nose. Thick yellow or greenish drainage from your nose. Tenderness, swelling, and warmth over the affected sinuses. A cough that may get worse at night. Decreased sense of smell and taste. Extra mucus that collects in the throat or the back of the nose (postnasal drip) causing a sore throat or bad breath. Tiredness (fatigue). Fever. How is this diagnosed? This condition is diagnosed based on: Your symptoms. Your medical history. A physical exam. Tests to find out if your condition is acute or chronic. This may include: Checking your nose for nasal polyps. Viewing your sinuses using a device that has a light (endoscope). Testing for allergies or bacteria. Imaging tests, such as an MRI or CT scan. In rare cases, a bone biopsy may be done to rule out more  serious types of fungal sinus disease. How is this treated? Treatment for a sinus infection depends on the cause and whether your condition is chronic or acute. If caused by a virus, your symptoms should go away on their own within 10 days. You may be given medicines to relieve symptoms. They include: Medicines that shrink swollen nasal passages (decongestants). A spray that eases inflammation of the nostrils (topical intranasal corticosteroids). Rinses that help get rid of thick mucus in your nose (nasal saline washes). Medicines that treat allergies (antihistamines). Over-the-counter pain relievers. If caused by bacteria, your health care provider may recommend waiting to see if your symptoms improve. Most bacterial infections will get better without antibiotic medicine. You may be given antibiotics if you have: A severe infection. A weak immune system. If caused by narrow nasal  passages or nasal polyps, surgery may be needed. Follow these instructions at home: Medicines Take, use, or apply over-the-counter and prescription medicines only as told by your health care provider. These may include nasal sprays. If you were prescribed an antibiotic medicine, take it as told by your health care provider. Do not stop taking the antibiotic even if you start to feel better. Hydrate and humidify  Drink enough fluid to keep your urine pale yellow. Staying hydrated will help to thin your mucus. Use a cool mist humidifier to keep the humidity level in your home above 50%. Inhale steam for 10-15 minutes, 3-4 times a day, or as told by your health care provider. You can do this in the bathroom while a hot shower is running. Limit your exposure to cool or dry air. Rest Rest as much as possible. Sleep with your head raised (elevated). Make sure you get enough sleep each night. General instructions  Apply a warm, moist washcloth to your face 3-4 times a day or as told by your health care provider. This  will help with discomfort. Use nasal saline washes as often as told by your health care provider. Wash your hands often with soap and water to reduce your exposure to germs. If soap and water are not available, use hand sanitizer. Do not smoke. Avoid being around people who are smoking (secondhand smoke). Keep all follow-up visits. This is important. Contact a health care provider if: You have a fever. Your symptoms get worse. Your symptoms do not improve within 10 days. Get help right away if: You have a severe headache. You have persistent vomiting. You have severe pain or swelling around your face or eyes. You have vision problems. You develop confusion. Your neck is stiff. You have trouble breathing. These symptoms may be an emergency. Get help right away. Call 911. Do not wait to see if the symptoms will go away. Do not drive yourself to the hospital. Summary A sinus infection is soreness and inflammation of your sinuses. Sinuses are hollow spaces in the bones around your face. This condition is caused by nasal tissues that become inflamed or swollen. The swelling traps or blocks the flow of mucus. This allows bacteria, viruses, and fungi to grow, which leads to infection. If you were prescribed an antibiotic medicine, take it as told by your health care provider. Do not stop taking the antibiotic even if you start to feel better. Keep all follow-up visits. This is important. This information is not intended to replace advice given to you by your health care provider. Make sure you discuss any questions you have with your health care provider. Document Revised: 02/12/2021 Document Reviewed: 02/12/2021 Elsevier Patient Education  2024 [employer].    If you have been instructed to have an in-person evaluation today at a local Urgent Care facility, please use the link below. It will take you to a list of all of our available [facility], including address, phone  number and hours of operation. Please do not delay care.  [facility]  If you or a family member do not have a primary care provider, use the link below to schedule a visit and establish care. When you choose a [facility] primary care physician or advanced practice provider, you gain a long-term partner in health. Find a Primary Care Provider  Learn more about [facility]'s in-office and virtual care options: [facility] - Get Care Now

## 2023-03-09 DIAGNOSIS — G501 Atypical facial pain: Secondary | ICD-10-CM | POA: Diagnosis not present

## 2023-03-10 ENCOUNTER — Ambulatory Visit: Payer: Medicare Other | Admitting: Nurse Practitioner

## 2023-03-30 ENCOUNTER — Encounter: Payer: Medicare Other | Admitting: Nurse Practitioner

## 2023-04-09 DIAGNOSIS — M62838 Other muscle spasm: Secondary | ICD-10-CM | POA: Diagnosis not present

## 2023-04-09 DIAGNOSIS — G501 Atypical facial pain: Secondary | ICD-10-CM | POA: Diagnosis not present

## 2023-04-09 DIAGNOSIS — G894 Chronic pain syndrome: Secondary | ICD-10-CM | POA: Diagnosis not present

## 2023-04-09 DIAGNOSIS — M5481 Occipital neuralgia: Secondary | ICD-10-CM | POA: Diagnosis not present

## 2023-04-12 ENCOUNTER — Other Ambulatory Visit: Payer: Self-pay | Admitting: Nurse Practitioner

## 2023-04-12 DIAGNOSIS — Z79890 Hormone replacement therapy (postmenopausal): Secondary | ICD-10-CM

## 2023-04-13 NOTE — Telephone Encounter (Signed)
Med refill request: Estradiol Patches Last AEX: 02/25/2021-TW Next AEX: 06/03/2023-TW Last MMG (if hormonal med): UTD Refill authorized: rx pend.

## 2023-04-21 DIAGNOSIS — M9901 Segmental and somatic dysfunction of cervical region: Secondary | ICD-10-CM | POA: Diagnosis not present

## 2023-04-21 DIAGNOSIS — M9908 Segmental and somatic dysfunction of rib cage: Secondary | ICD-10-CM | POA: Diagnosis not present

## 2023-04-21 DIAGNOSIS — M9907 Segmental and somatic dysfunction of upper extremity: Secondary | ICD-10-CM | POA: Diagnosis not present

## 2023-04-21 DIAGNOSIS — M26629 Arthralgia of temporomandibular joint, unspecified side: Secondary | ICD-10-CM | POA: Diagnosis not present

## 2023-04-21 DIAGNOSIS — R0781 Pleurodynia: Secondary | ICD-10-CM | POA: Diagnosis not present

## 2023-04-21 DIAGNOSIS — M542 Cervicalgia: Secondary | ICD-10-CM | POA: Diagnosis not present

## 2023-04-21 DIAGNOSIS — M5382 Other specified dorsopathies, cervical region: Secondary | ICD-10-CM | POA: Diagnosis not present

## 2023-04-21 DIAGNOSIS — M9900 Segmental and somatic dysfunction of head region: Secondary | ICD-10-CM | POA: Diagnosis not present

## 2023-06-02 DIAGNOSIS — M9901 Segmental and somatic dysfunction of cervical region: Secondary | ICD-10-CM | POA: Diagnosis not present

## 2023-06-02 DIAGNOSIS — R0781 Pleurodynia: Secondary | ICD-10-CM | POA: Diagnosis not present

## 2023-06-02 DIAGNOSIS — M9908 Segmental and somatic dysfunction of rib cage: Secondary | ICD-10-CM | POA: Diagnosis not present

## 2023-06-02 DIAGNOSIS — G501 Atypical facial pain: Secondary | ICD-10-CM | POA: Diagnosis not present

## 2023-06-02 DIAGNOSIS — M9907 Segmental and somatic dysfunction of upper extremity: Secondary | ICD-10-CM | POA: Diagnosis not present

## 2023-06-02 DIAGNOSIS — M542 Cervicalgia: Secondary | ICD-10-CM | POA: Diagnosis not present

## 2023-06-02 DIAGNOSIS — M5382 Other specified dorsopathies, cervical region: Secondary | ICD-10-CM | POA: Diagnosis not present

## 2023-06-02 DIAGNOSIS — M9900 Segmental and somatic dysfunction of head region: Secondary | ICD-10-CM | POA: Diagnosis not present

## 2023-06-02 DIAGNOSIS — M5481 Occipital neuralgia: Secondary | ICD-10-CM | POA: Diagnosis not present

## 2023-06-02 DIAGNOSIS — M26629 Arthralgia of temporomandibular joint, unspecified side: Secondary | ICD-10-CM | POA: Diagnosis not present

## 2023-06-02 DIAGNOSIS — M62838 Other muscle spasm: Secondary | ICD-10-CM | POA: Diagnosis not present

## 2023-06-02 DIAGNOSIS — G894 Chronic pain syndrome: Secondary | ICD-10-CM | POA: Diagnosis not present

## 2023-06-03 ENCOUNTER — Ambulatory Visit (INDEPENDENT_AMBULATORY_CARE_PROVIDER_SITE_OTHER): Payer: Medicare Other | Admitting: Nurse Practitioner

## 2023-06-03 ENCOUNTER — Encounter: Payer: Self-pay | Admitting: Nurse Practitioner

## 2023-06-03 VITALS — BP 128/80 | HR 86 | Ht 68.0 in | Wt 208.0 lb

## 2023-06-03 DIAGNOSIS — N8110 Cystocele, unspecified: Secondary | ICD-10-CM | POA: Diagnosis not present

## 2023-06-03 DIAGNOSIS — N816 Rectocele: Secondary | ICD-10-CM

## 2023-06-03 DIAGNOSIS — Z01419 Encounter for gynecological examination (general) (routine) without abnormal findings: Secondary | ICD-10-CM | POA: Diagnosis not present

## 2023-06-03 DIAGNOSIS — M85852 Other specified disorders of bone density and structure, left thigh: Secondary | ICD-10-CM

## 2023-06-03 DIAGNOSIS — Z79890 Hormone replacement therapy (postmenopausal): Secondary | ICD-10-CM | POA: Diagnosis not present

## 2023-06-03 DIAGNOSIS — N958 Other specified menopausal and perimenopausal disorders: Secondary | ICD-10-CM

## 2023-06-03 DIAGNOSIS — Z1331 Encounter for screening for depression: Secondary | ICD-10-CM | POA: Diagnosis not present

## 2023-06-03 MED ORDER — ESTRADIOL 0.1 MG/GM VA CREA: 1.0000 | TOPICAL_CREAM | VAGINAL | 2 refills | Status: AC

## 2023-06-03 MED ORDER — ESTRADIOL 0.05 MG/24HR TD PTWK: 0.0500 mg | MEDICATED_PATCH | TRANSDERMAL | 3 refills | Status: DC

## 2023-06-03 NOTE — Progress Notes (Signed)
 Diana Gross [birth year] [id number]   History:  68 y.o. G1P1001 presents for breast and pelvic exam. Postmenopausal - on ERT with improvement in menopausal symptoms but also feels it helps with trigeminal neuralgia that is managed by [facility] who agrees with this for management. Also using vaginal estrogen due to history of recurrent UTIs with good management. She has a rectocele with cystocele that is becoming more problematic. She has to splint during fecal and urinary voiding to empty. Denies urinary or fecal incontinence. She has tried a pessary in the past but did not like. She does have chronic constipation d/t pain medications. Urogyn referral provided last year. S/P 1997 TVH for fibroid. Cryosurgery 1989, subsequent paps normal. PHQ-11 today, reports all situational.   Gynecologic History No LMP recorded (lmp unknown). Patient has had a hysterectomy.   Contraception: status post hysterectomy Sexually active: Yes  Health Maintenance Last Pap: 2019. Results were: Normal Last mammogram: 11/14/2022. Results were: Normal Last colonoscopy: Age 2. Negative Cologuard 01/17/2021 Last Dexa: 03/10/2022. Results were: T-score -1.0, FRAX 7.9% / 0.6%     06/03/2023   10:08 AM  Depression screen PHQ 2/9  Decreased Interest 0  Down, Depressed, Hopeless 1  PHQ - 2 Score 1  Altered sleeping 3  Tired, decreased energy 1  Change in appetite 3  Feeling bad or failure about yourself  2  Trouble concentrating 1  Moving slowly or fidgety/restless 0  Suicidal thoughts 0  PHQ-9 Score 11     Past medical history, past surgical history, family history and social history were all reviewed and documented in the EPIC chart. Married. Mother diagnosed with breast cancer at age 78.   ROS:  A ROS was performed and pertinent positives and negatives are included.  Exam:  Vitals:   06/03/23 1005  BP: 128/80  Pulse: 86  SpO2: 98%  Weight: 208 lb (94.3 kg)  Height: 5\' 8"  (1.727 m)    Body  mass index is 31.63 kg/m.  General appearance:  Normal Thyroid:  Symmetrical, normal in size, without palpable masses or nodularity. Respiratory  Auscultation:  Clear without wheezing or rhonchi Cardiovascular  Auscultation:  Regular rate, without rubs, murmurs or gallops  Edema/varicosities:  Not grossly evident Abdominal  Soft,nontender, without masses, guarding or rebound.  Liver/spleen:  No organomegaly noted  Hernia:  None appreciated  Skin  Inspection:  Grossly normal.  Breasts: Examined lying and sitting.   Right: Without masses, retractions, nipple discharge or axillary adenopathy.   Left: Without masses, retractions, nipple discharge or axillary adenopathy. Genitourinary   Inguinal/mons:  Normal without inguinal adenopathy  External genitalia:  Normal appearing vulva with no masses, tenderness, or lesions  BUS/Urethra/Skene's glands:  Normal  Vagina:  Grade 1 cystocele with grade 2-3 rectocele. Normal appearing vaginal tissue with atrophic changes  Cervix:  Absent  Uterus:  Absent  Adnexa/parametria:     Rt: Normal in size, without masses or tenderness.   Lt: Normal in size, without masses or tenderness.  Anus and perineum: Normal  Digital rectal exam: Not indicated  Patient informed chaperone available to be present for breast and pelvic exam. Patient has requested no chaperone to be present. Patient has been advised what will be completed during breast and pelvic exam.   Assessment/Plan:  68 y.o. G1P1001 for breast and pelvic exam.   Well female exam with routine gynecological exam - Education provided on SBEs, importance of preventative screenings, current guidelines, high calcium diet, regular exercise, and multivitamin daily.  Labs with  PCP.   Postmenopausal - on ERT. S/P TVH for fibroid.  Hormone replacement therapy - Plan: estradiol (CLIMARA - DOSED IN MG/24 HR) 0.05 mg/24hr patch weekly. She is using as prescribed. She is aware of the risks with continued  use. She has tried to wean in the past and could not tolerate as she felt depressed and "in a funk" without it, as well as worsening pain s/t to neuralgia. She uses it for trigeminal neuralgia and it is recommended she continue by her physician at [facility] for management. Refill x 1 year provided.   Post-menopausal atrophic vaginitis - Plan: conjugated estrogens (PREMARIN) vaginal cream twice weekly due to recurrent UTIs.   Cystocele with rectocele - Grade 1 cystocele with grade 2-3 rectocele. She has a rectocele with cystocele that is becoming more problematic. She has to splint during fecal and urinary voiding to empty. Denies urinary or fecal incontinence. She has tried pessary in the past but did not like. She does have chronic constipation d/t pain medications. She is interested in surgery. We will send referral to Urogynecology for evaluation.   Osteopenia of neck of left femur - Mild. T-score -1.1. Continue Vitamin D supplement and incorporate high calcium food into diet. She is limited in exercise due to back pain. Low impact exercises and walking recommended.   Screening for cervical cancer - Cryosurgery 1989, subsequent paps normal. No longer screening per guidelines.   Screening for breast cancer - Normal mammogram history.  Continue annual screenings.  Normal breast exam today.  Screening for colon cancer - Negative Cologuard 12/2020. Will repeat at GI's recommended interval.   Return in 1 year for breast and pelvic exam.     Olivia Mackie DNP, 10:15 AM 06/03/2023

## 2023-06-04 DIAGNOSIS — G501 Atypical facial pain: Secondary | ICD-10-CM | POA: Diagnosis not present

## 2023-06-04 DIAGNOSIS — G500 Trigeminal neuralgia: Secondary | ICD-10-CM | POA: Diagnosis not present

## 2023-06-30 ENCOUNTER — Ambulatory Visit (INDEPENDENT_AMBULATORY_CARE_PROVIDER_SITE_OTHER): Admitting: Family Medicine

## 2023-06-30 VITALS — BP 126/74 | HR 65 | Temp 98.2°F | Wt 211.2 lb

## 2023-06-30 DIAGNOSIS — M26629 Arthralgia of temporomandibular joint, unspecified side: Secondary | ICD-10-CM | POA: Diagnosis not present

## 2023-06-30 DIAGNOSIS — Z1283 Encounter for screening for malignant neoplasm of skin: Secondary | ICD-10-CM

## 2023-06-30 DIAGNOSIS — R0781 Pleurodynia: Secondary | ICD-10-CM | POA: Diagnosis not present

## 2023-06-30 DIAGNOSIS — M9900 Segmental and somatic dysfunction of head region: Secondary | ICD-10-CM | POA: Diagnosis not present

## 2023-06-30 DIAGNOSIS — M9907 Segmental and somatic dysfunction of upper extremity: Secondary | ICD-10-CM | POA: Diagnosis not present

## 2023-06-30 DIAGNOSIS — M542 Cervicalgia: Secondary | ICD-10-CM | POA: Diagnosis not present

## 2023-06-30 DIAGNOSIS — R21 Rash and other nonspecific skin eruption: Secondary | ICD-10-CM | POA: Diagnosis not present

## 2023-06-30 DIAGNOSIS — M9908 Segmental and somatic dysfunction of rib cage: Secondary | ICD-10-CM | POA: Diagnosis not present

## 2023-06-30 DIAGNOSIS — M9901 Segmental and somatic dysfunction of cervical region: Secondary | ICD-10-CM | POA: Diagnosis not present

## 2023-06-30 DIAGNOSIS — M5382 Other specified dorsopathies, cervical region: Secondary | ICD-10-CM | POA: Diagnosis not present

## 2023-06-30 MED ORDER — CLOTRIMAZOLE-BETAMETHASONE 1-0.05 % EX CREA: 1.0000 | TOPICAL_CREAM | Freq: Every day | CUTANEOUS | 0 refills | Status: DC

## 2023-06-30 NOTE — Patient Instructions (Signed)
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
## 2023-07-08 DIAGNOSIS — L590 Erythema ab igne [dermatitis ab igne]: Secondary | ICD-10-CM | POA: Diagnosis not present

## 2023-07-08 DIAGNOSIS — D229 Melanocytic nevi, unspecified: Secondary | ICD-10-CM | POA: Diagnosis not present

## 2023-07-08 DIAGNOSIS — L815 Leukoderma, not elsewhere classified: Secondary | ICD-10-CM | POA: Diagnosis not present

## 2023-07-08 DIAGNOSIS — L821 Other seborrheic keratosis: Secondary | ICD-10-CM | POA: Diagnosis not present

## 2023-07-15 DIAGNOSIS — K080 Exfoliation of teeth due to systemic causes: Secondary | ICD-10-CM | POA: Diagnosis not present

## 2023-07-17 ENCOUNTER — Encounter: Payer: Self-pay | Admitting: Family Medicine

## 2023-07-17 NOTE — Telephone Encounter (Signed)
 Will need copy of Jury summons to provide a letter. The information on the summons must be included in letter.

## 2023-07-17 NOTE — Telephone Encounter (Signed)
 No further action needed.

## 2023-07-31 DIAGNOSIS — Z0279 Encounter for issue of other medical certificate: Secondary | ICD-10-CM

## 2023-07-31 NOTE — Telephone Encounter (Signed)
 Jury summons copy was dropped off. Please let patient know if any further info is needed & when letter is ready to be picked up.

## 2023-07-31 NOTE — Telephone Encounter (Signed)
 Summons placed in PCP desk for review.

## 2023-08-03 DIAGNOSIS — G500 Trigeminal neuralgia: Secondary | ICD-10-CM | POA: Diagnosis not present

## 2023-08-03 DIAGNOSIS — G44209 Tension-type headache, unspecified, not intractable: Secondary | ICD-10-CM | POA: Diagnosis not present

## 2023-08-03 DIAGNOSIS — M26621 Arthralgia of right temporomandibular joint: Secondary | ICD-10-CM | POA: Diagnosis not present

## 2023-08-03 DIAGNOSIS — G894 Chronic pain syndrome: Secondary | ICD-10-CM | POA: Diagnosis not present

## 2023-08-03 NOTE — Telephone Encounter (Signed)
 Drafted jury duty excuse for patient.  Placed in CMA work basket . Please call pt and let her know it is completed and she can pick up letter

## 2023-08-03 NOTE — Telephone Encounter (Signed)
 Pt made aware. Placed in folder upfront for pt pick up.

## 2023-08-04 DIAGNOSIS — H9202 Otalgia, left ear: Secondary | ICD-10-CM | POA: Diagnosis not present

## 2023-08-11 DIAGNOSIS — R0781 Pleurodynia: Secondary | ICD-10-CM | POA: Diagnosis not present

## 2023-08-11 DIAGNOSIS — M542 Cervicalgia: Secondary | ICD-10-CM | POA: Diagnosis not present

## 2023-08-11 DIAGNOSIS — M5382 Other specified dorsopathies, cervical region: Secondary | ICD-10-CM | POA: Diagnosis not present

## 2023-08-11 DIAGNOSIS — M26629 Arthralgia of temporomandibular joint, unspecified side: Secondary | ICD-10-CM | POA: Diagnosis not present

## 2023-08-17 DIAGNOSIS — M9900 Segmental and somatic dysfunction of head region: Secondary | ICD-10-CM | POA: Diagnosis not present

## 2023-08-17 DIAGNOSIS — M9901 Segmental and somatic dysfunction of cervical region: Secondary | ICD-10-CM | POA: Diagnosis not present

## 2023-08-17 DIAGNOSIS — M9907 Segmental and somatic dysfunction of upper extremity: Secondary | ICD-10-CM | POA: Diagnosis not present

## 2023-08-17 DIAGNOSIS — M9908 Segmental and somatic dysfunction of rib cage: Secondary | ICD-10-CM | POA: Diagnosis not present

## 2023-08-25 DIAGNOSIS — M542 Cervicalgia: Secondary | ICD-10-CM | POA: Diagnosis not present

## 2023-08-25 DIAGNOSIS — M9907 Segmental and somatic dysfunction of upper extremity: Secondary | ICD-10-CM | POA: Diagnosis not present

## 2023-08-25 DIAGNOSIS — R0781 Pleurodynia: Secondary | ICD-10-CM | POA: Diagnosis not present

## 2023-08-25 DIAGNOSIS — M9900 Segmental and somatic dysfunction of head region: Secondary | ICD-10-CM | POA: Diagnosis not present

## 2023-08-25 DIAGNOSIS — M5382 Other specified dorsopathies, cervical region: Secondary | ICD-10-CM | POA: Diagnosis not present

## 2023-08-25 DIAGNOSIS — M9901 Segmental and somatic dysfunction of cervical region: Secondary | ICD-10-CM | POA: Diagnosis not present

## 2023-08-25 DIAGNOSIS — M26629 Arthralgia of temporomandibular joint, unspecified side: Secondary | ICD-10-CM | POA: Diagnosis not present

## 2023-08-25 DIAGNOSIS — M9908 Segmental and somatic dysfunction of rib cage: Secondary | ICD-10-CM | POA: Diagnosis not present

## 2023-08-28 DIAGNOSIS — G44209 Tension-type headache, unspecified, not intractable: Secondary | ICD-10-CM | POA: Diagnosis not present

## 2023-09-28 DIAGNOSIS — G500 Trigeminal neuralgia: Secondary | ICD-10-CM | POA: Diagnosis not present

## 2023-09-28 DIAGNOSIS — G501 Atypical facial pain: Secondary | ICD-10-CM | POA: Diagnosis not present

## 2023-09-28 DIAGNOSIS — G44209 Tension-type headache, unspecified, not intractable: Secondary | ICD-10-CM | POA: Diagnosis not present

## 2023-09-28 DIAGNOSIS — G894 Chronic pain syndrome: Secondary | ICD-10-CM | POA: Diagnosis not present

## 2023-09-28 DIAGNOSIS — Z5181 Encounter for therapeutic drug level monitoring: Secondary | ICD-10-CM | POA: Diagnosis not present

## 2023-09-29 DIAGNOSIS — M5481 Occipital neuralgia: Secondary | ICD-10-CM | POA: Diagnosis not present

## 2023-09-29 DIAGNOSIS — M9900 Segmental and somatic dysfunction of head region: Secondary | ICD-10-CM | POA: Diagnosis not present

## 2023-09-29 DIAGNOSIS — M542 Cervicalgia: Secondary | ICD-10-CM | POA: Diagnosis not present

## 2023-09-29 DIAGNOSIS — M9901 Segmental and somatic dysfunction of cervical region: Secondary | ICD-10-CM | POA: Diagnosis not present

## 2023-09-29 DIAGNOSIS — M9907 Segmental and somatic dysfunction of upper extremity: Secondary | ICD-10-CM | POA: Diagnosis not present

## 2023-09-29 DIAGNOSIS — M9908 Segmental and somatic dysfunction of rib cage: Secondary | ICD-10-CM | POA: Diagnosis not present

## 2023-09-29 DIAGNOSIS — G5643 Causalgia of bilateral upper limbs: Secondary | ICD-10-CM | POA: Diagnosis not present

## 2023-09-29 DIAGNOSIS — R0781 Pleurodynia: Secondary | ICD-10-CM | POA: Diagnosis not present

## 2023-11-03 DIAGNOSIS — M9901 Segmental and somatic dysfunction of cervical region: Secondary | ICD-10-CM | POA: Diagnosis not present

## 2023-11-03 DIAGNOSIS — M9907 Segmental and somatic dysfunction of upper extremity: Secondary | ICD-10-CM | POA: Diagnosis not present

## 2023-11-03 DIAGNOSIS — M9908 Segmental and somatic dysfunction of rib cage: Secondary | ICD-10-CM | POA: Diagnosis not present

## 2023-11-03 DIAGNOSIS — M5481 Occipital neuralgia: Secondary | ICD-10-CM | POA: Diagnosis not present

## 2023-11-03 DIAGNOSIS — M542 Cervicalgia: Secondary | ICD-10-CM | POA: Diagnosis not present

## 2023-11-03 DIAGNOSIS — M9900 Segmental and somatic dysfunction of head region: Secondary | ICD-10-CM | POA: Diagnosis not present

## 2023-11-03 DIAGNOSIS — G5643 Causalgia of bilateral upper limbs: Secondary | ICD-10-CM | POA: Diagnosis not present

## 2023-11-03 DIAGNOSIS — R0781 Pleurodynia: Secondary | ICD-10-CM | POA: Diagnosis not present

## 2023-11-17 DIAGNOSIS — G501 Atypical facial pain: Secondary | ICD-10-CM | POA: Diagnosis not present

## 2023-11-20 DIAGNOSIS — Z1231 Encounter for screening mammogram for malignant neoplasm of breast: Secondary | ICD-10-CM | POA: Diagnosis not present

## 2023-11-20 LAB — HM MAMMOGRAPHY

## 2023-11-27 ENCOUNTER — Ambulatory Visit (HOSPITAL_BASED_OUTPATIENT_CLINIC_OR_DEPARTMENT_OTHER): Payer: Self-pay | Admitting: Obstetrics and Gynecology

## 2023-12-01 DIAGNOSIS — G501 Atypical facial pain: Secondary | ICD-10-CM | POA: Diagnosis not present

## 2023-12-01 DIAGNOSIS — G894 Chronic pain syndrome: Secondary | ICD-10-CM | POA: Diagnosis not present

## 2023-12-01 DIAGNOSIS — G44209 Tension-type headache, unspecified, not intractable: Secondary | ICD-10-CM | POA: Diagnosis not present

## 2024-01-04 NOTE — Telephone Encounter (Signed)
 No further action needed at this time.

## 2024-01-05 DIAGNOSIS — M9907 Segmental and somatic dysfunction of upper extremity: Secondary | ICD-10-CM | POA: Diagnosis not present

## 2024-01-05 DIAGNOSIS — M9901 Segmental and somatic dysfunction of cervical region: Secondary | ICD-10-CM | POA: Diagnosis not present

## 2024-01-05 DIAGNOSIS — M5481 Occipital neuralgia: Secondary | ICD-10-CM | POA: Diagnosis not present

## 2024-01-05 DIAGNOSIS — M9908 Segmental and somatic dysfunction of rib cage: Secondary | ICD-10-CM | POA: Diagnosis not present

## 2024-01-05 DIAGNOSIS — M542 Cervicalgia: Secondary | ICD-10-CM | POA: Diagnosis not present

## 2024-01-05 DIAGNOSIS — G5643 Causalgia of bilateral upper limbs: Secondary | ICD-10-CM | POA: Diagnosis not present

## 2024-01-05 DIAGNOSIS — R0789 Other chest pain: Secondary | ICD-10-CM | POA: Diagnosis not present

## 2024-01-05 DIAGNOSIS — M9900 Segmental and somatic dysfunction of head region: Secondary | ICD-10-CM | POA: Diagnosis not present

## 2024-01-20 ENCOUNTER — Ambulatory Visit (INDEPENDENT_AMBULATORY_CARE_PROVIDER_SITE_OTHER): Payer: Medicare Other | Admitting: *Deleted

## 2024-01-20 VITALS — Ht 68.5 in | Wt 201.0 lb

## 2024-01-20 DIAGNOSIS — Z1211 Encounter for screening for malignant neoplasm of colon: Secondary | ICD-10-CM

## 2024-01-20 DIAGNOSIS — Z0000 Encounter for general adult medical examination without abnormal findings: Secondary | ICD-10-CM
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
## 2024-01-20 NOTE — Patient Instructions (Signed)
 [name] , Thank you for taking time to come for your Medicare Wellness Visit. I appreciate your ongoing commitment to your health goals. Please review the following plan we discussed and let me know if I can assist you in the future.   Screening recommendations/referrals: Colonoscopy:  Mammogram:  Bone Density:  Recommended yearly ophthalmology/optometry visit for glaucoma screening and checkup Recommended yearly dental visit for hygiene and checkup  Vaccinations: Influenza vaccine:  Pneumococcal vaccine:  Tdap vaccine:  Shingles vaccine:       Preventive Care 65 Years and Older, Female Preventive care refers to lifestyle choices and visits with your health care provider that can promote health and wellness. What does preventive care include? A yearly physical exam. This is also called an annual well check. Dental exams once or twice a year. Routine eye exams. Ask your health care provider how often you should have your eyes checked. Personal lifestyle choices, including: Daily care of your teeth and gums. Regular physical activity. Eating a healthy diet. Avoiding tobacco and drug use. Limiting alcohol use. Practicing safe sex. Taking low-dose aspirin every day. Taking vitamin and mineral supplements as recommended by your health care provider. What happens during an annual well check? The services and screenings done by your health care provider during your annual well check will depend on your age, overall health, lifestyle risk factors, and family history of disease. Counseling  Your health care provider may ask you questions about your: Alcohol use. Tobacco use. Drug use. Emotional well-being. Home and relationship well-being. Sexual activity. Eating habits. History of falls. Memory and ability to understand (cognition). Work and work astronomer. Reproductive health. Screening  You may have the following tests or measurements: Height, weight, and  BMI. Blood pressure. Lipid and cholesterol levels. These may be checked every 5 years, or more frequently if you are over 62 years old. Skin check. Lung cancer screening. You may have this screening every year starting at age 81 if you have a 30-pack-year history of smoking and currently smoke or have quit within the past 15 years. Fecal occult blood test (FOBT) of the stool. You may have this test every year starting at age 61. Flexible sigmoidoscopy or colonoscopy. You may have a sigmoidoscopy every 5 years or a colonoscopy every 10 years starting at age 82. Hepatitis C blood test. Hepatitis B blood test. Sexually transmitted disease (STD) testing. Diabetes screening. This is done by checking your blood sugar (glucose) after you have not eaten for a while (fasting). You may have this done every 1-3 years. Bone density scan. This is done to screen for osteoporosis. You may have this done starting at age 70. Mammogram. This may be done every 1-2 years. Talk to your health care provider about how often you should have regular mammograms. Talk with your health care provider about your test results, treatment options, and if necessary, the need for more tests. Vaccines  Your health care provider may recommend certain vaccines, such as: Influenza vaccine. This is recommended every year. Tetanus, diphtheria, and acellular pertussis (Tdap, Td) vaccine. You may need a Td booster every 10 years. Zoster vaccine. You may need this after age 57. Pneumococcal 13-valent conjugate (PCV13) vaccine. One dose is recommended after age 64. Pneumococcal polysaccharide (PPSV23) vaccine. One dose is recommended after age 4. Talk to your health care provider about which screenings and vaccines you need and how often you need them. This information is not intended to replace advice given to you by your health care  provider. Make sure you discuss any questions you have with your health care provider. Document  Released: 04/06/2015 Document Revised: 11/28/2015 Document Reviewed: 01/09/2015 Elsevier Interactive Patient Education  2017 [employer].  Fall Prevention in the Home Falls can cause injuries. They can happen to people of all ages. There are many things you can do to make your home safe and to help prevent falls. What can I do on the outside of my home? Regularly fix the edges of walkways and driveways and fix any cracks. Remove anything that might make you trip as you walk through a door, such as a raised step or threshold. Trim any bushes or trees on the path to your home. Use bright outdoor lighting. Clear any walking paths of anything that might make someone trip, such as rocks or tools. Regularly check to see if handrails are loose or broken. Make sure that both sides of any steps have handrails. Any raised decks and porches should have guardrails on the edges. Have any leaves, snow, or ice cleared regularly. Use sand or salt on walking paths during winter. Clean up any spills in your garage right away. This includes oil or grease spills. What can I do in the bathroom? Use night lights. Install grab bars by the toilet and in the tub and shower. Do not use towel bars as grab bars. Use non-skid mats or decals in the tub or shower. If you need to sit down in the shower, use a plastic, non-slip stool. Keep the floor dry. Clean up any water that spills on the floor as soon as it happens. Remove soap buildup in the tub or shower regularly. Attach bath mats securely with double-sided non-slip rug tape. Do not have throw rugs and other things on the floor that can make you trip. What can I do in the bedroom? Use night lights. Make sure that you have a light by your bed that is easy to reach. Do not use any sheets or blankets that are too big for your bed. They should not hang down onto the floor. Have a firm chair that has side arms. You can use this for support while you get dressed. Do  not have throw rugs and other things on the floor that can make you trip. What can I do in the kitchen? Clean up any spills right away. Avoid walking on wet floors. Keep items that you use a lot in easy-to-reach places. If you need to reach something above you, use a strong step stool that has a grab bar. Keep electrical cords out of the way. Do not use floor polish or wax that makes floors slippery. If you must use wax, use non-skid floor wax. Do not have throw rugs and other things on the floor that can make you trip. What can I do with my stairs? Do not leave any items on the stairs. Make sure that there are handrails on both sides of the stairs and use them. Fix handrails that are broken or loose. Make sure that handrails are as long as the stairways. Check any carpeting to make sure that it is firmly attached to the stairs. Fix any carpet that is loose or worn. Avoid having throw rugs at the top or bottom of the stairs. If you do have throw rugs, attach them to the floor with carpet tape. Make sure that you have a light switch at the top of the stairs and the bottom of the stairs. If you do not have  them, ask someone to add them for you. What else can I do to help prevent falls? Wear shoes that: Do not have high heels. Have rubber bottoms. Are comfortable and fit you well. Are closed at the toe. Do not wear sandals. If you use a stepladder: Make sure that it is fully opened. Do not climb a closed stepladder. Make sure that both sides of the stepladder are locked into place. Ask someone to hold it for you, if possible. Clearly mark and make sure that you can see: Any grab bars or handrails. First and last steps. Where the edge of each step is. Use tools that help you move around (mobility aids) if they are needed. These include: Canes. Walkers. Scooters. Crutches. Turn on the lights when you go into a dark area. Replace any light bulbs as soon as they burn out. Set up your  furniture so you have a clear path. Avoid moving your furniture around. If any of your floors are uneven, fix them. If there are any pets around you, be aware of where they are. Review your medicines with your doctor. Some medicines can make you feel dizzy. This can increase your chance of falling. Ask your doctor what other things that you can do to help prevent falls. This information is not intended to replace advice given to you by your health care provider. Make sure you discuss any questions you have with your health care provider. Document Released: 01/04/2009 Document Revised: 08/16/2015 Document Reviewed: 04/14/2014 Elsevier Interactive Patient Education  2017 [employer].

## 2024-01-25 DIAGNOSIS — G501 Atypical facial pain: Secondary | ICD-10-CM | POA: Diagnosis not present

## 2024-01-25 DIAGNOSIS — G44209 Tension-type headache, unspecified, not intractable: Secondary | ICD-10-CM | POA: Diagnosis not present

## 2024-01-25 DIAGNOSIS — M26621 Arthralgia of right temporomandibular joint: Secondary | ICD-10-CM | POA: Diagnosis not present

## 2024-01-25 DIAGNOSIS — G894 Chronic pain syndrome: Secondary | ICD-10-CM | POA: Diagnosis not present

## 2024-01-26 DIAGNOSIS — Z1211 Encounter for screening for malignant neoplasm of colon: Secondary | ICD-10-CM | POA: Diagnosis not present

## 2024-02-02 LAB — COLOGUARD: COLOGUARD: NEGATIVE

## 2024-02-03 ENCOUNTER — Ambulatory Visit: Payer: Self-pay

## 2024-02-03 NOTE — Telephone Encounter (Signed)
 FYI Only or Action Required?: FYI only for provider: appointment scheduled on 02/04/24.  Patient was last seen in primary care on 06/30/2023 by Catherine Fuller A, DO.  Called Nurse Triage reporting Rash.  Symptoms began several days ago.  Interventions attempted: OTC medications: neosporin.  Symptoms are: gradually worsening.  Triage Disposition: See Today or Tomorrow in Office (overriding Home Care)  Patient/caregiver understands and will follow disposition?:  Reason for Disposition  Mild localized rash  Answer Assessment - Initial Assessment Questions One week ago, woke up with painful thumbs and index fingers. Then they were red, resolved. Over the weekend, she noticed a rash had developed. Experiencing a burning sensation.  States looks similar to shingles when she searched it online. Indicated she has been under a lot of stress recently, having lost one dog and having another hospitalized.   States burning sensation is tolerable with her Rx pain medications for her neuralgia. Advised to call back or go to UC if symptoms worsen or if a rash appears on her groin, neck or face.  1. APPEARANCE of RASH: What does the rash look like? (e.g., blisters, dry flaky skin, red spots, redness, sores)     Red and splotchy, dry  2. LOCATION: Where is the rash located?      Bilateral thumbs and index fingers  3. NUMBER: How many spots are there?      Many  4. ONSET: When did the rash start?      5 days  6. ITCHING: Does the rash itch? If Yes, ask: How bad is the itch?  (Scale 0-10; or none, mild, moderate, severe)     No, burning sensation  7. PAIN: Does the rash hurt? If Yes, ask: How bad is the pain?  (Scale 0-10; or none, mild, moderate, severe)     6/10 - improves slightly with neosporin  Protocols used: Rash or Redness - Localized-A-AH Copied from CRM [id number]. Topic: Clinical - Red Word Triage >> Feb 03, 2024  2:09 PM Victoria A wrote: [facility] that prompted  transfer to Nurse Triage: Patient has burning rash on both thumbs and seems to be spreading to index finger. Patient is having pain   Past Medical History:  Diagnosis Date   Abnormal uterine bleeding    prior to hysterectomy   Allergy    Anemia    prior to hysterectomy   Anxiety    Arthritis    right hip   Basal cell carcinoma 2015   Right shin.    BCC (basal cell carcinoma of skin)    Bulging lumbar disc    L5   Cervical pain    Complex regional pain syndrome    Depression    Epidemic cervical myalgia    Family history of breast cancer    Family history of glaucoma 07/02/2015   Fibroid    reason for Hysterectomy   Genetic testing 01/08/2017   Common Cancers panel (47 genes) @ [facility] - No pathogenic mutation detected   Heart murmur    Grade I   History of abnormal cervical Pap smear 1989   --hx colpo/cryotherapy to cervix    History of cranial surgery    with complications which has caused chronic pain    History of laminectomy    Hx of migraines    Neuromuscular disorder (HCC)    fibromyalgia, myofasical pain   Occipital neuralgia    Tic [place]ou[place]    TMJ (dislocation of temporomandibular joint)    Trigeminal neuralgia  Urinary incontinence    Vaginal prolapse 2016   cystocele and rectocele

## 2024-02-04 ENCOUNTER — Encounter: Payer: Self-pay | Admitting: Family Medicine

## 2024-02-04 ENCOUNTER — Ambulatory Visit (INDEPENDENT_AMBULATORY_CARE_PROVIDER_SITE_OTHER): Admitting: Family Medicine

## 2024-02-04 VITALS — BP 124/78 | HR 63 | Temp 98.3°F

## 2024-02-04 DIAGNOSIS — L2489 Irritant contact dermatitis due to other agents: Secondary | ICD-10-CM | POA: Insufficient documentation

## 2024-02-04 MED ORDER — FLUOCINONIDE 0.05 % EX CREA: 1.0000 | TOPICAL_CREAM | Freq: Two times a day (BID) | CUTANEOUS | 1 refills | Status: AC
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
## 2024-02-26 DIAGNOSIS — G44209 Tension-type headache, unspecified, not intractable: Secondary | ICD-10-CM | POA: Diagnosis not present

## 2024-02-29 ENCOUNTER — Other Ambulatory Visit: Payer: Self-pay | Admitting: Nurse Practitioner

## 2024-02-29 ENCOUNTER — Encounter: Payer: Self-pay | Admitting: Nurse Practitioner

## 2024-02-29 DIAGNOSIS — Z79890 Hormone replacement therapy (postmenopausal): Secondary | ICD-10-CM

## 2024-02-29 MED ORDER — ESTRADIOL 0.05 MG/24HR TD PTWK: 0.0500 mg | MEDICATED_PATCH | TRANSDERMAL | 1 refills | Status: AC

## 2024-02-29 NOTE — Telephone Encounter (Signed)
 Yes, not sure why she is having an issue getting filled. Thanks.

## 2024-03-29 ENCOUNTER — Encounter: Payer: Self-pay | Admitting: Family Medicine

## 2024-03-29 ENCOUNTER — Other Ambulatory Visit: Payer: Self-pay | Admitting: Family Medicine

## 2024-06-10 ENCOUNTER — Encounter: Admitting: Nurse Practitioner

## 2025-01-25 ENCOUNTER — Ambulatory Visit
